# Patient Record
Sex: Male | Born: 1985
Health system: Southern US, Community
[De-identification: ages and names within clinical notes are randomized; demographics above are authoritative.]

## PROBLEM LIST (undated history)

## (undated) DIAGNOSIS — Z9289 Personal history of other medical treatment: Secondary | ICD-10-CM

## (undated) DIAGNOSIS — Z960 Presence of urogenital implants: Secondary | ICD-10-CM

## (undated) DIAGNOSIS — G43909 Migraine, unspecified, not intractable, without status migrainosus: Secondary | ICD-10-CM

## (undated) DIAGNOSIS — M869 Osteomyelitis, unspecified: Secondary | ICD-10-CM

## (undated) DIAGNOSIS — J939 Pneumothorax, unspecified: Secondary | ICD-10-CM

## (undated) DIAGNOSIS — I10 Essential (primary) hypertension: Secondary | ICD-10-CM

## (undated) DIAGNOSIS — R519 Headache, unspecified: Secondary | ICD-10-CM

## (undated) DIAGNOSIS — N319 Neuromuscular dysfunction of bladder, unspecified: Secondary | ICD-10-CM

## (undated) DIAGNOSIS — Z978 Presence of other specified devices: Secondary | ICD-10-CM

## (undated) DIAGNOSIS — G709 Myoneural disorder, unspecified: Secondary | ICD-10-CM

## (undated) DIAGNOSIS — S24102A Unspecified injury at T2-T6 level of thoracic spinal cord, initial encounter: Secondary | ICD-10-CM

## (undated) DIAGNOSIS — W3400XA Accidental discharge from unspecified firearms or gun, initial encounter: Secondary | ICD-10-CM

## (undated) DIAGNOSIS — G8220 Paraplegia, unspecified: Secondary | ICD-10-CM

## (undated) DIAGNOSIS — K592 Neurogenic bowel, not elsewhere classified: Secondary | ICD-10-CM

## (undated) DIAGNOSIS — G904 Autonomic dysreflexia: Secondary | ICD-10-CM

## (undated) DIAGNOSIS — R51 Headache: Secondary | ICD-10-CM

## (undated) HISTORY — DX: Migraine, unspecified, not intractable, without status migrainosus: G43.909

## (undated) HISTORY — DX: Osteomyelitis, unspecified: M86.9

## (undated) HISTORY — DX: Pneumothorax, unspecified: J93.9

## (undated) HISTORY — PX: INCISION AND DRAINAGE HIP: SHX1801

## (undated) HISTORY — PX: GIRDLESTONE ARTHROPLASTY: SHX5550

---
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
## 2005-02-01 ENCOUNTER — Emergency Department (HOSPITAL_COMMUNITY): Admission: EM | Admit: 2005-02-01 | Discharge: 2005-02-01 | Payer: Self-pay | Admitting: Family Medicine

## 2006-07-24 ENCOUNTER — Emergency Department (HOSPITAL_COMMUNITY): Admission: EM | Admit: 2006-07-24 | Discharge: 2006-07-24 | Payer: Self-pay | Admitting: Emergency Medicine

## 2006-11-27 DIAGNOSIS — K592 Neurogenic bowel, not elsewhere classified: Secondary | ICD-10-CM

## 2006-11-27 DIAGNOSIS — N319 Neuromuscular dysfunction of bladder, unspecified: Secondary | ICD-10-CM

## 2006-11-27 DIAGNOSIS — Z9289 Personal history of other medical treatment: Secondary | ICD-10-CM

## 2006-11-27 DIAGNOSIS — W3400XA Accidental discharge from unspecified firearms or gun, initial encounter: Secondary | ICD-10-CM

## 2006-11-27 HISTORY — DX: Neurogenic bowel, not elsewhere classified: K59.2

## 2006-11-27 HISTORY — PX: BRAIN SURGERY: SHX531

## 2006-11-27 HISTORY — PX: EYE SURGERY: SHX253

## 2006-11-27 HISTORY — PX: ORBITAL RECONSTRUCTION: SHX2115

## 2006-11-27 HISTORY — DX: Accidental discharge from unspecified firearms or gun, initial encounter: W34.00XA

## 2006-11-27 HISTORY — DX: Personal history of other medical treatment: Z92.89

## 2006-11-27 HISTORY — DX: Neuromuscular dysfunction of bladder, unspecified: N31.9

## 2006-12-11 ENCOUNTER — Emergency Department (HOSPITAL_COMMUNITY): Admission: EM | Admit: 2006-12-11 | Discharge: 2006-12-11 | Payer: Self-pay | Admitting: Emergency Medicine

## 2006-12-12 ENCOUNTER — Inpatient Hospital Stay (HOSPITAL_COMMUNITY): Admission: AC | Admit: 2006-12-12 | Discharge: 2007-01-04 | Payer: Self-pay

## 2006-12-12 ENCOUNTER — Ambulatory Visit: Payer: Self-pay | Admitting: Cardiology

## 2006-12-18 ENCOUNTER — Ambulatory Visit: Payer: Self-pay | Admitting: Physical Medicine & Rehabilitation

## 2006-12-21 ENCOUNTER — Ambulatory Visit: Payer: Self-pay | Admitting: Infectious Diseases

## 2006-12-28 ENCOUNTER — Ambulatory Visit: Payer: Self-pay | Admitting: Vascular Surgery

## 2007-01-04 ENCOUNTER — Inpatient Hospital Stay (HOSPITAL_COMMUNITY)
Admission: RE | Admit: 2007-01-04 | Discharge: 2007-01-29 | Payer: Self-pay | Admitting: Physical Medicine & Rehabilitation

## 2007-02-01 ENCOUNTER — Ambulatory Visit: Payer: Self-pay | Admitting: Internal Medicine

## 2007-02-10 ENCOUNTER — Emergency Department (HOSPITAL_COMMUNITY): Admission: EM | Admit: 2007-02-10 | Discharge: 2007-02-10 | Payer: Self-pay | Admitting: Emergency Medicine

## 2007-02-25 ENCOUNTER — Ambulatory Visit: Payer: Self-pay | Admitting: Internal Medicine

## 2007-03-06 ENCOUNTER — Encounter
Admission: RE | Admit: 2007-03-06 | Discharge: 2007-06-04 | Payer: Self-pay | Admitting: Physical Medicine & Rehabilitation

## 2007-03-14 ENCOUNTER — Encounter: Admission: RE | Admit: 2007-03-14 | Discharge: 2007-06-12 | Payer: Self-pay | Admitting: Internal Medicine

## 2007-04-05 ENCOUNTER — Ambulatory Visit (HOSPITAL_COMMUNITY): Admission: RE | Admit: 2007-04-05 | Discharge: 2007-04-05 | Payer: Self-pay | Admitting: Urology

## 2007-04-22 ENCOUNTER — Emergency Department (HOSPITAL_COMMUNITY): Admission: EM | Admit: 2007-04-22 | Discharge: 2007-04-22 | Payer: Self-pay | Admitting: Emergency Medicine

## 2007-04-24 ENCOUNTER — Ambulatory Visit: Payer: Self-pay | Admitting: Internal Medicine

## 2007-05-27 ENCOUNTER — Ambulatory Visit: Payer: Self-pay | Admitting: Physical Medicine & Rehabilitation

## 2007-06-13 ENCOUNTER — Encounter: Admission: RE | Admit: 2007-06-13 | Discharge: 2007-07-16 | Payer: Self-pay | Admitting: Internal Medicine

## 2007-06-26 ENCOUNTER — Ambulatory Visit: Payer: Self-pay | Admitting: Physical Medicine & Rehabilitation

## 2007-06-26 ENCOUNTER — Encounter
Admission: RE | Admit: 2007-06-26 | Discharge: 2007-09-24 | Payer: Self-pay | Admitting: Physical Medicine & Rehabilitation

## 2007-07-02 ENCOUNTER — Ambulatory Visit: Payer: Self-pay | Admitting: Internal Medicine

## 2007-07-16 ENCOUNTER — Encounter: Admission: RE | Admit: 2007-07-16 | Discharge: 2007-08-23 | Payer: Self-pay | Admitting: Internal Medicine

## 2007-07-26 ENCOUNTER — Ambulatory Visit: Payer: Self-pay | Admitting: Physical Medicine & Rehabilitation

## 2007-08-29 ENCOUNTER — Ambulatory Visit: Payer: Self-pay | Admitting: Physical Medicine & Rehabilitation

## 2007-09-17 ENCOUNTER — Encounter: Payer: Self-pay | Admitting: Internal Medicine

## 2007-09-17 ENCOUNTER — Encounter
Admission: RE | Admit: 2007-09-17 | Discharge: 2007-12-16 | Payer: Self-pay | Admitting: Physical Medicine & Rehabilitation

## 2007-09-26 ENCOUNTER — Ambulatory Visit: Payer: Self-pay | Admitting: Physical Medicine & Rehabilitation

## 2007-10-01 ENCOUNTER — Encounter: Payer: Self-pay | Admitting: Internal Medicine

## 2007-10-01 DIAGNOSIS — G8220 Paraplegia, unspecified: Secondary | ICD-10-CM | POA: Insufficient documentation

## 2007-10-01 DIAGNOSIS — K219 Gastro-esophageal reflux disease without esophagitis: Secondary | ICD-10-CM | POA: Insufficient documentation

## 2007-10-01 DIAGNOSIS — R519 Headache, unspecified: Secondary | ICD-10-CM | POA: Insufficient documentation

## 2007-10-01 DIAGNOSIS — R51 Headache: Secondary | ICD-10-CM | POA: Insufficient documentation

## 2007-10-16 ENCOUNTER — Encounter: Payer: Self-pay | Admitting: Internal Medicine

## 2007-11-19 ENCOUNTER — Ambulatory Visit: Payer: Self-pay | Admitting: Physical Medicine & Rehabilitation

## 2007-12-12 ENCOUNTER — Ambulatory Visit: Payer: Self-pay | Admitting: Physical Medicine & Rehabilitation

## 2007-12-12 ENCOUNTER — Encounter
Admission: RE | Admit: 2007-12-12 | Discharge: 2008-03-11 | Payer: Self-pay | Admitting: Physical Medicine & Rehabilitation

## 2007-12-26 ENCOUNTER — Ambulatory Visit: Payer: Self-pay | Admitting: Internal Medicine

## 2008-01-01 ENCOUNTER — Encounter: Payer: Self-pay | Admitting: Internal Medicine

## 2008-01-01 ENCOUNTER — Encounter: Admission: RE | Admit: 2008-01-01 | Discharge: 2008-03-05 | Payer: Self-pay | Admitting: Internal Medicine

## 2008-01-08 ENCOUNTER — Encounter: Payer: Self-pay | Admitting: Internal Medicine

## 2008-01-16 ENCOUNTER — Ambulatory Visit: Payer: Self-pay | Admitting: Physical Medicine & Rehabilitation

## 2008-02-10 ENCOUNTER — Encounter: Payer: Self-pay | Admitting: Internal Medicine

## 2008-02-18 ENCOUNTER — Ambulatory Visit: Payer: Self-pay | Admitting: Physical Medicine & Rehabilitation

## 2008-03-05 ENCOUNTER — Encounter: Payer: Self-pay | Admitting: Internal Medicine

## 2008-03-10 ENCOUNTER — Encounter: Payer: Self-pay | Admitting: Internal Medicine

## 2008-03-16 ENCOUNTER — Telehealth: Payer: Self-pay | Admitting: Internal Medicine

## 2008-03-18 ENCOUNTER — Ambulatory Visit: Payer: Self-pay | Admitting: Internal Medicine

## 2008-03-18 DIAGNOSIS — R002 Palpitations: Secondary | ICD-10-CM | POA: Insufficient documentation

## 2008-03-18 HISTORY — DX: Palpitations: R00.2

## 2008-03-19 DIAGNOSIS — N319 Neuromuscular dysfunction of bladder, unspecified: Secondary | ICD-10-CM

## 2008-03-19 DIAGNOSIS — I959 Hypotension, unspecified: Secondary | ICD-10-CM

## 2008-03-20 ENCOUNTER — Encounter: Payer: Self-pay | Admitting: Internal Medicine

## 2008-03-23 ENCOUNTER — Encounter
Admission: RE | Admit: 2008-03-23 | Discharge: 2008-06-21 | Payer: Self-pay | Admitting: Physical Medicine & Rehabilitation

## 2008-03-25 ENCOUNTER — Ambulatory Visit: Payer: Self-pay | Admitting: Physical Medicine & Rehabilitation

## 2008-04-02 ENCOUNTER — Ambulatory Visit: Payer: Self-pay

## 2008-04-08 ENCOUNTER — Encounter: Payer: Self-pay | Admitting: Internal Medicine
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
## 2008-05-11 ENCOUNTER — Telehealth: Payer: Self-pay | Admitting: Internal Medicine

## 2008-05-11 ENCOUNTER — Encounter: Payer: Self-pay | Admitting: Internal Medicine

## 2008-05-14 ENCOUNTER — Telehealth: Payer: Self-pay | Admitting: Internal Medicine

## 2008-05-14 ENCOUNTER — Encounter: Payer: Self-pay | Admitting: Internal Medicine

## 2008-05-20 ENCOUNTER — Ambulatory Visit: Payer: Self-pay | Admitting: Physical Medicine & Rehabilitation

## 2008-05-26 ENCOUNTER — Encounter: Payer: Self-pay | Admitting: Internal Medicine

## 2008-05-26 ENCOUNTER — Encounter: Admission: RE | Admit: 2008-05-26 | Discharge: 2008-07-21 | Payer: Self-pay | Admitting: Internal Medicine

## 2008-07-06 ENCOUNTER — Encounter: Payer: Self-pay | Admitting: Internal Medicine

## 2008-07-07 ENCOUNTER — Encounter: Payer: Self-pay | Admitting: Internal Medicine

## 2008-07-16 ENCOUNTER — Telehealth: Payer: Self-pay | Admitting: Internal Medicine

## 2008-07-16 ENCOUNTER — Encounter: Payer: Self-pay | Admitting: Internal Medicine

## 2008-08-11 ENCOUNTER — Encounter
Admission: RE | Admit: 2008-08-11 | Discharge: 2008-11-09 | Payer: Self-pay | Admitting: Physical Medicine & Rehabilitation

## 2008-08-12 ENCOUNTER — Ambulatory Visit: Payer: Self-pay | Admitting: Physical Medicine & Rehabilitation

## 2008-09-08 ENCOUNTER — Ambulatory Visit: Payer: Self-pay | Admitting: Physical Medicine & Rehabilitation

## 2008-09-17 ENCOUNTER — Telehealth: Payer: Self-pay | Admitting: Internal Medicine

## 2008-09-25 ENCOUNTER — Telehealth: Payer: Self-pay | Admitting: Internal Medicine

## 2008-09-28 ENCOUNTER — Encounter: Payer: Self-pay | Admitting: Internal Medicine

## 2008-09-29 ENCOUNTER — Encounter: Payer: Self-pay | Admitting: Internal Medicine

## 2008-10-06 ENCOUNTER — Ambulatory Visit: Payer: Self-pay | Admitting: Physical Medicine & Rehabilitation

## 2008-10-11 ENCOUNTER — Encounter: Payer: Self-pay | Admitting: Internal Medicine

## 2008-11-03 ENCOUNTER — Ambulatory Visit: Payer: Self-pay | Admitting: Physical Medicine & Rehabilitation

## 2008-12-10 ENCOUNTER — Encounter
Admission: RE | Admit: 2008-12-10 | Discharge: 2009-03-10 | Payer: Self-pay | Admitting: Physical Medicine & Rehabilitation

## 2008-12-11 ENCOUNTER — Ambulatory Visit: Payer: Self-pay | Admitting: Physical Medicine & Rehabilitation

## 2008-12-15 ENCOUNTER — Telehealth: Payer: Self-pay | Admitting: Internal Medicine

## 2008-12-25 ENCOUNTER — Telehealth: Payer: Self-pay | Admitting: Internal Medicine

## 2008-12-30 ENCOUNTER — Encounter: Payer: Self-pay | Admitting: Internal Medicine

## 2009-01-04 ENCOUNTER — Telehealth (INDEPENDENT_AMBULATORY_CARE_PROVIDER_SITE_OTHER): Payer: Self-pay | Admitting: *Deleted

## 2009-01-07 ENCOUNTER — Ambulatory Visit: Payer: Self-pay | Admitting: Internal Medicine

## 2009-01-12 ENCOUNTER — Ambulatory Visit: Payer: Self-pay | Admitting: Physical Medicine & Rehabilitation

## 2009-02-10 ENCOUNTER — Ambulatory Visit: Payer: Self-pay | Admitting: Physical Medicine & Rehabilitation

## 2009-02-25 ENCOUNTER — Ambulatory Visit: Payer: Self-pay | Admitting: Internal Medicine

## 2009-02-25 DIAGNOSIS — R609 Edema, unspecified: Secondary | ICD-10-CM | POA: Insufficient documentation

## 2009-02-25 LAB — CONVERTED CEMR LAB
BUN: 13 mg/dL (ref 6–23)
Basophils Absolute: 0 10*3/uL (ref 0.0–0.1)
CO2: 29 meq/L (ref 19–32)
Chloride: 102 meq/L (ref 96–112)
Creatinine, Ser: 0.7 mg/dL (ref 0.4–1.5)
Eosinophils Absolute: 0.1 10*3/uL (ref 0.0–0.7)
GFR calc non Af Amer: 178.76 mL/min (ref >60)
Glucose, Bld: 93 mg/dL (ref 70–99)
HDL: 27.9 mg/dL — ABNORMAL LOW (ref >39.00)
Hemoglobin: 14.8 g/dL (ref 13.0–17.0)
MCV: 82.8 fL (ref 78.0–100.0)
Monocytes Absolute: 0.6 10*3/uL (ref 0.1–1.0)
Sodium: 139 meq/L (ref 135–145)
Total CHOL/HDL Ratio: 10
Triglycerides: 223 mg/dL — ABNORMAL HIGH (ref 0.0–149.0)
WBC: 8.1 10*3/uL (ref 4.5–10.5)

## 2009-02-27 DIAGNOSIS — J309 Allergic rhinitis, unspecified: Secondary | ICD-10-CM

## 2009-03-07 ENCOUNTER — Encounter: Payer: Self-pay | Admitting: Internal Medicine

## 2009-03-10 ENCOUNTER — Encounter
Admission: RE | Admit: 2009-03-10 | Discharge: 2009-06-08 | Payer: Self-pay | Admitting: Physical Medicine & Rehabilitation

## 2009-03-12 ENCOUNTER — Ambulatory Visit: Payer: Self-pay | Admitting: Physical Medicine & Rehabilitation

## 2009-04-07 ENCOUNTER — Ambulatory Visit: Payer: Self-pay | Admitting: Physical Medicine & Rehabilitation

## 2009-04-30 ENCOUNTER — Ambulatory Visit: Payer: Self-pay | Admitting: Physical Medicine & Rehabilitation

## 2009-06-04 ENCOUNTER — Ambulatory Visit: Payer: Self-pay | Admitting: Physical Medicine & Rehabilitation

## 2009-06-23 ENCOUNTER — Encounter
Admission: RE | Admit: 2009-06-23 | Discharge: 2009-09-21 | Payer: Self-pay | Admitting: Physical Medicine & Rehabilitation

## 2009-06-29 ENCOUNTER — Telehealth: Payer: Self-pay | Admitting: Internal Medicine

## 2009-07-02 ENCOUNTER — Ambulatory Visit: Payer: Self-pay | Admitting: Physical Medicine & Rehabilitation

## 2009-07-24 ENCOUNTER — Emergency Department (HOSPITAL_COMMUNITY): Admission: EM | Admit: 2009-07-24 | Discharge: 2009-07-24 | Payer: Self-pay | Admitting: Emergency Medicine

## 2009-07-30 ENCOUNTER — Ambulatory Visit: Payer: Self-pay | Admitting: Physical Medicine & Rehabilitation

## 2009-08-04 ENCOUNTER — Ambulatory Visit: Payer: Self-pay | Admitting: Internal Medicine

## 2009-08-04 DIAGNOSIS — I10 Essential (primary) hypertension: Secondary | ICD-10-CM | POA: Insufficient documentation

## 2009-08-11 ENCOUNTER — Ambulatory Visit: Payer: Self-pay | Admitting: Internal Medicine

## 2009-08-11 DIAGNOSIS — N529 Male erectile dysfunction, unspecified: Secondary | ICD-10-CM

## 2009-08-12 ENCOUNTER — Ambulatory Visit: Payer: Self-pay | Admitting: Internal Medicine

## 2009-08-25 ENCOUNTER — Ambulatory Visit: Payer: Self-pay | Admitting: Physical Medicine & Rehabilitation

## 2009-09-01 ENCOUNTER — Telehealth: Payer: Self-pay | Admitting: Internal Medicine

## 2009-09-12 ENCOUNTER — Encounter: Payer: Self-pay | Admitting: Internal Medicine

## 2009-09-16 ENCOUNTER — Ambulatory Visit: Payer: Self-pay | Admitting: Internal Medicine

## 2009-09-21 ENCOUNTER — Encounter
Admission: RE | Admit: 2009-09-21 | Discharge: 2009-10-15 | Payer: Self-pay | Admitting: Physical Medicine & Rehabilitation

## 2009-09-22 ENCOUNTER — Ambulatory Visit: Payer: Self-pay | Admitting: Physical Medicine & Rehabilitation

## 2009-10-14 ENCOUNTER — Ambulatory Visit: Payer: Self-pay | Admitting: Internal Medicine

## 2009-10-14 DIAGNOSIS — G9050 Complex regional pain syndrome I, unspecified: Secondary | ICD-10-CM

## 2009-10-15 ENCOUNTER — Encounter
Admission: RE | Admit: 2009-10-15 | Discharge: 2009-11-17 | Payer: Self-pay | Admitting: Physical Medicine & Rehabilitation

## 2009-10-18 ENCOUNTER — Ambulatory Visit: Payer: Self-pay | Admitting: Physical Medicine & Rehabilitation

## 2009-10-18 ENCOUNTER — Ambulatory Visit: Payer: Self-pay | Admitting: Internal Medicine

## 2009-11-17 ENCOUNTER — Ambulatory Visit: Payer: Self-pay | Admitting: Physical Medicine & Rehabilitation

## 2009-11-17 ENCOUNTER — Ambulatory Visit: Payer: Self-pay | Admitting: Internal Medicine

## 2009-12-09 ENCOUNTER — Emergency Department (HOSPITAL_COMMUNITY): Admission: EM | Admit: 2009-12-09 | Discharge: 2009-12-10 | Payer: Self-pay | Admitting: Emergency Medicine

## 2009-12-21 ENCOUNTER — Encounter
Admission: RE | Admit: 2009-12-21 | Discharge: 2010-03-21 | Payer: Self-pay | Admitting: Physical Medicine & Rehabilitation

## 2009-12-22 ENCOUNTER — Ambulatory Visit: Payer: Self-pay | Admitting: Physical Medicine & Rehabilitation

## 2010-01-18 ENCOUNTER — Encounter: Payer: Self-pay | Admitting: Internal Medicine

## 2010-01-20 ENCOUNTER — Ambulatory Visit: Payer: Self-pay | Admitting: Physical Medicine & Rehabilitation

## 2010-01-21 ENCOUNTER — Ambulatory Visit: Payer: Self-pay | Admitting: Internal Medicine

## 2010-02-16 ENCOUNTER — Encounter: Payer: Self-pay | Admitting: Internal Medicine

## 2010-02-17 ENCOUNTER — Ambulatory Visit: Payer: Self-pay | Admitting: Physical Medicine & Rehabilitation

## 2010-02-24 ENCOUNTER — Ambulatory Visit: Payer: Self-pay | Admitting: Internal Medicine

## 2010-03-02 ENCOUNTER — Encounter: Payer: Self-pay | Admitting: Internal Medicine

## 2010-03-07 ENCOUNTER — Encounter: Payer: Self-pay | Admitting: Internal Medicine

## 2010-03-07 ENCOUNTER — Encounter: Admission: RE | Admit: 2010-03-07 | Discharge: 2010-04-14 | Payer: Self-pay | Admitting: Internal Medicine

## 2010-03-16 ENCOUNTER — Encounter: Payer: Self-pay | Admitting: Internal Medicine

## 2010-03-28 ENCOUNTER — Encounter
Admission: RE | Admit: 2010-03-28 | Discharge: 2010-06-26 | Payer: Self-pay | Admitting: Physical Medicine & Rehabilitation

## 2010-03-31 ENCOUNTER — Encounter: Payer: Self-pay | Admitting: Internal Medicine

## 2010-04-11 ENCOUNTER — Ambulatory Visit: Payer: Self-pay | Admitting: Internal Medicine

## 2010-04-13 ENCOUNTER — Ambulatory Visit: Payer: Self-pay | Admitting: Physical Medicine & Rehabilitation

## 2010-04-17 ENCOUNTER — Emergency Department (HOSPITAL_COMMUNITY): Admission: EM | Admit: 2010-04-17 | Discharge: 2010-04-17 | Payer: Self-pay | Admitting: Emergency Medicine

## 2010-04-27 ENCOUNTER — Telehealth: Payer: Self-pay | Admitting: Internal Medicine

## 2010-06-10 ENCOUNTER — Ambulatory Visit: Payer: Self-pay | Admitting: Internal Medicine

## 2010-12-27 NOTE — Letter (Signed)
Summary: Facial Plastic Surgery/[facility]  Facial Plastic Surgery/[facility]   Imported By: Sherian Rein 03/15/2010 09:31:42  _____________________________________________________________________  External Attachment:    Type:   Image     Comment:   External Document
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
## 2010-12-27 NOTE — Miscellaneous (Signed)
Summary: Summary/[place]  Summary/[place]   Imported By: Lester Gerster 05/11/2010 08:14:59  _____________________________________________________________________  External Attachment:    Type:   Image     Comment:   External Document

## 2010-12-27 NOTE — Miscellaneous (Signed)
Summary: Plan of Care & Treatment/[facility]  Plan of Care & Treatment/[facility]   Imported By: Sherian Rein 06/13/2010 13:21:51  _____________________________________________________________________  External Attachment:    Type:   Image     Comment:   External Document

## 2010-12-27 NOTE — Miscellaneous (Signed)
Summary: PT Eval/[facility]   PT Eval/[facility]   Imported By: Sherian Rein 03/21/2010 08:09:16  _____________________________________________________________________  External Attachment:    Type:   Image     Comment:   External Document

## 2010-12-27 NOTE — Letter (Signed)
Summary: [facility]  [facility]   Imported By: Sherian Rein 01/25/2010 09:39:55  _____________________________________________________________________  External Attachment:    Type:   Image     Comment:   External Document

## 2010-12-27 NOTE — Letter (Signed)
Summary: [facility]  [facility]   Imported By: Sherian Rein 02/26/2010 09:19:16  _____________________________________________________________________  External Attachment:    Type:   Image     Comment:   External Document

## 2010-12-27 NOTE — Letter (Signed)
Summary: [facility]   Imported By: Lester Methow 04/28/2010 10:52:08  _____________________________________________________________________  External Attachment:    Type:   Image     Comment:   External Document

## 2010-12-27 NOTE — Letter (Signed)
Summary: CMN for Wheelchair reparis/[facility]  CMN for Wheelchair reparis/[facility]   Imported By: Sherian Rein 04/01/2010 14:48:29  _____________________________________________________________________  External Attachment:    Type:   Image     Comment:   External Document

## 2010-12-27 NOTE — Miscellaneous (Signed)
Summary: Plan of Care & Treatment/[facility]  Plan [facility] Care & Treatment/[facility]   Imported By: Sherian Rein 04/14/2010 08:23:24  _____________________________________________________________________  External Attachment:    Type:   Image     Comment:   External Document

## 2010-12-27 NOTE — Progress Notes (Signed)
Summary: Allergy Medication  Phone Note Call from Patient Call back at Home Phone [phone]   Summary of Call: Patient is requesting to resume singulair daily for allergies. Says this has helped more than anything in the past. If singulair is not covered, he would like prescription for allegra. If both are not covered, pt would like PA on singulair.  Initial call taken by: Lamar Sprinkles, CMA,  April 27, 2010 4:38 PM  Follow-up for Phone Call        OK for singulair 10mg  daily, #30, refill prn Follow-up by: Jacques Navy MD,  April 28, 2010 9:00 AM    New/Updated Medications: SINGULAIR 10 MG TABS (MONTELUKAST SODIUM) 1 once daily Prescriptions: SINGULAIR 10 MG TABS (MONTELUKAST SODIUM) 1 once daily  #90 x 3   Entered by:   Lamar Sprinkles, CMA   Authorized by:   Jacques Navy MD   Signed by:   Lamar Sprinkles, CMA on 04/28/2010   Method used:   Electronically to        [facility]. [phone]* (retail)       [address]       [place], [place]  [address]       Ph: [phone]       Fax: [phone]   RxID:   [id number]

## 2011-02-12 LAB — DIFFERENTIAL
Basophils Absolute: 0.1 10*3/uL (ref 0.0–0.1)
Basophils Relative: 1 % (ref 0–1)
Eosinophils Absolute: 0.2 10*3/uL (ref 0.0–0.7)
Lymphocytes Relative: 33 % (ref 12–46)
Lymphs Abs: 3.8 10*3/uL (ref 0.7–4.0)
Monocytes Absolute: 1 10*3/uL (ref 0.1–1.0)
Monocytes Relative: 9 % (ref 3–12)
Neutrophils Relative %: 56 % (ref 43–77)

## 2011-02-12 LAB — URINALYSIS, ROUTINE W REFLEX MICROSCOPIC
Ketones, ur: NEGATIVE mg/dL
Nitrite: NEGATIVE
Protein, ur: NEGATIVE mg/dL
Specific Gravity, Urine: 1.028 (ref 1.005–1.030)
pH: 6.5 (ref 5.0–8.0)

## 2011-02-12 LAB — URINE MICROSCOPIC-ADD ON

## 2011-02-12 LAB — POCT I-STAT, CHEM 8
BUN: 16 mg/dL (ref 6–23)
Glucose, Bld: 129 mg/dL — ABNORMAL HIGH (ref 70–99)
Potassium: 4.1 mEq/L (ref 3.5–5.1)
Sodium: 144 mEq/L (ref 135–145)
TCO2: 29 mmol/L (ref 0–100)

## 2011-02-12 LAB — URINE CULTURE

## 2011-02-12 LAB — CBC
HCT: 47.7 % (ref 39.0–52.0)
Hemoglobin: 15.7 g/dL (ref 13.0–17.0)
MCHC: 33 g/dL (ref 30.0–36.0)
WBC: 11.4 10*3/uL — ABNORMAL HIGH (ref 4.0–10.5)

## 2011-02-13 LAB — URINE MICROSCOPIC-ADD ON

## 2011-02-13 LAB — DIFFERENTIAL
Eosinophils Absolute: 0.2 10*3/uL (ref 0.0–0.7)
Eosinophils Relative: 2 % (ref 0–5)
Lymphocytes Relative: 20 % (ref 12–46)
Neutrophils Relative %: 68 % (ref 43–77)

## 2011-02-13 LAB — CBC
HCT: 37.3 % — ABNORMAL LOW (ref 39.0–52.0)
MCV: 83.8 fL (ref 78.0–100.0)
Platelets: 387 10*3/uL (ref 150–400)
RDW: 14 % (ref 11.5–15.5)

## 2011-02-13 LAB — URINALYSIS, ROUTINE W REFLEX MICROSCOPIC
Bilirubin Urine: NEGATIVE
Glucose, UA: NEGATIVE mg/dL
Ketones, ur: NEGATIVE mg/dL
Nitrite: NEGATIVE
Urobilinogen, UA: 1 mg/dL (ref 0.0–1.0)
pH: 6 (ref 5.0–8.0)

## 2011-02-13 LAB — URINE CULTURE

## 2011-02-13 LAB — POCT I-STAT, CHEM 8
BUN: 15 mg/dL (ref 6–23)
Calcium, Ion: 1.16 mmol/L (ref 1.12–1.32)
Creatinine, Ser: 0.9 mg/dL (ref 0.4–1.5)
TCO2: 27 mmol/L (ref 0–100)

## 2011-03-04 LAB — URINALYSIS, ROUTINE W REFLEX MICROSCOPIC
Bilirubin Urine: NEGATIVE
Nitrite: NEGATIVE
Specific Gravity, Urine: 1.005 (ref 1.005–1.030)
pH: 6.5 (ref 5.0–8.0)

## 2011-03-04 LAB — DIFFERENTIAL
Eosinophils Relative: 0 % (ref 0–5)
Lymphocytes Relative: 11 % — ABNORMAL LOW (ref 12–46)
Lymphs Abs: 1.5 10*3/uL (ref 0.7–4.0)

## 2011-03-04 LAB — URINE MICROSCOPIC-ADD ON

## 2011-03-04 LAB — LIPASE, BLOOD: Lipase: 14 U/L (ref 11–59)

## 2011-03-04 LAB — COMPREHENSIVE METABOLIC PANEL
AST: 20 U/L (ref 0–37)
Albumin: 3.6 g/dL (ref 3.5–5.2)
CO2: 25 mEq/L (ref 19–32)
Calcium: 9.6 mg/dL (ref 8.4–10.5)
Creatinine, Ser: 0.85 mg/dL (ref 0.4–1.5)
GFR calc Af Amer: >60 mL/min (ref >60)
GFR calc non Af Amer: >60 mL/min (ref >60)

## 2011-03-04 LAB — CBC
MCHC: 33.4 g/dL (ref 30.0–36.0)
MCV: 84.2 fL (ref 78.0–100.0)
Platelets: 298 10*3/uL (ref 150–400)

## 2011-03-04 LAB — URINE CULTURE
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
## 2011-04-11 NOTE — Assessment & Plan Note (Signed)
Sean Horton is back regarding his brain injury and spinal cord injury.  We  started him on Lyrica and Tofranil last visit.  His back pain is much  better but he is having pain from the sides radiating into the hips.  His wound is healing on his buttock.  He complains of significant  migraines associated with kickoff from his bladder.  He went to the ER  apparently for one of them and got pain medication.  He has not  collected his urine for culture as we had ordered last time.  He has not  seen Urology in followup.  Patient reports constipation with the  Percocet.  He has occasional spasms, particularly when he transfers, but  none at rest.  The patient remains in outpatient physical therapy and  apparently has been fairly motivated.  Patient rates his pain at a 6-8  out of 10 today.  States that pain interferes with general activity,  relations with others, enjoyment of life on a moderate to severe level.   REVIEW OF SYSTEMS:  Patient reports depression, anxiety, loss of smell,  bladder control problems as mentioned above.  Full review is in the  written health and history section of the chart.   SOCIAL HISTORY:  Is without change.   PHYSICAL EXAMINATION:  Blood pressure is 88/62, pulse is 110,  respiratory 18, he is sating 99% room air.  Patient is appropriate,  alert and oriented x3.  Upper extremity muscle function is 5 out of 5,  he continues to have poor trunk control and no appreciable movement in  the legs.  Patient had a minimal tone at the legs today with 2+  reflexes, he was much improved overall today.  HEART:  Tachycardic.  CHEST:  Clear.  ABDOMEN:  Soft, nontender.   ASSESSMENT:  1. T3 complete spinal cord injury.  2. Neurogenic bowel.  3. Neurogenic bladder with kickoff.  4. Centralized spinal pain.  5. Lower extremity spasticity which has improved with Baclofen      titration.  6. Depression/anxiety.  7. Headaches which may be a manifestation of autonomic dysreflexia      rather than migraines per se.   PLAN:  1. Need to see culture of urine collected to assess this as a source      for kickoff and potential autonomic dysfunction.  2. Patient needs to stay on top of his bowel program as we talked      about previously.  3. Continue treating sacral wound as this appears to be healing up.  4. We will increase Lyrica to 150 mg t.i.d. for one week and up to 300      mg b.i.d.  5. Increase Tofranil to 150 mg nightly.  6. Will change Percocet to oxycodone 5 mg one q.6 hours p.r.n.      breakthrough pain.  7. Refilled Lidoderm patches.  8. Patient needs to arrange followup with Urology to assess      urodynamics.  9. Maintain Baclofen at 20 mg t.i.d.  10.I will see him back in about a month's time.  Actually, I would      like for him to continue working on therapy over this time as well.      Ranelle Oyster, M.D.  Electronically Signed     ZTS/MedQ  D:  07/01/2007 12:43:31  T:  07/01/2007 14:05:25  Job #:  [id number]   cc:   Rosalyn Gess. Norins, MD  [address] [facility]  [place]  [place] [address]
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
## 2011-04-11 NOTE — Assessment & Plan Note (Signed)
[facility]                                 ON-CALL NOTE   Sean Horton, Sean Horton                          MRN:          [id number]  DATE:06/01/2008                            DOB:          19-May-1986    DATE OF INTERACTION:  June 01, 2008 at 5:59 p.m.   PHONE NUMBER:  [phone]   CALLER:  Bard Herbert with [facility].   SUBJECTIVE:  The patient has irregular heartbeat.  He is a 25 year old  quadriplegic with fast irregular heartbeat who was followed by Dr.  Debby Bud.  He also has a pressure ulcer, which is why [facility]  is seeing him 3 times a week.  He has complained of palpitations before,  but the nurse has never found any irregular heartbeat before.  Pulse is  typically 90-100 with blood pressure 134/92.  He has had a monitor of a  month in the past with no findings found.  She feels that he is having  an irregularly irregular rhythm at this time, although his rate is less  than 100.   OBJECTIVE:  Possible atrial fibrillation.   PLAN:  If the patient is unstable, should go to the emergency room to be  seen.  It sounds like he is indeed stable at this point.  Would call if  his heartbeat stays irregular for an appointment tomorrow.  If his  heartbeat is irregular when she is ready to leave the house today, would  take him to [facility] and have him get an EKG there to at least document  the rhythm that he is in, whether it  be PVCs, AFib, flutter, etc.  It  does not sound like it would be flutter and with his rate control where  it is.  Call Dr. Debby Bud in the morning, or possibly go in to be seen.   Primary care Naliah Eddington is Dr. Debby Bud and home office is [place].     Arta Silence, MD  Electronically Signed    RNS/MedQ  DD: 06/01/2008  DT: 06/02/2008  Job #: [id number]
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
## 2011-04-11 NOTE — Assessment & Plan Note (Signed)
Sean Horton is back regarding his spinal cord injury and pain.  He is back on  his OxyContin and oxycodone as we previously had prescribed.  He still  complains of left hand burning pain and numbness.  He had not really  discussed further with the nursing or at least to have it got back to me  as a growing concern.  We had increased his Elavil slightly, though he  did not notice much difference with that.  The wound has generally  healed.  He is still applying a wet-to-dry dressing daily.  Pain is a  5/6 currently.  He described it as sharp and aching.  Pain interferes  with general activity, in relationship with others, and enjoyment of  life on a moderate level.  Pain worsens with bending and activities,  improves with his medications.   REVIEW OF SYSTEMS:  Notable for ongoing bladder spasms.  He has not  followed up with Urology recently.  His bowel schedule is fairly  regulated.  Other pertinent positives are above.  Full review is in the  written health and history section.   SOCIAL HISTORY:  The patient is single, living with his parents.   PHYSICAL EXAMINATION:  VITAL SIGNS:  Blood pressure is 137/55, pulse is  90, respiratory rate is 18, and he is sating 94% on room air.  EXTREMITIES:  The patient has trace tone in the legs with active  reflexes at 3+.  His sensation is still 2/3 about the level of injury.  He has fair truncal control.  He has positive Tinel sign at the left  elbow today and left wrist as well.  No ulnar weakness or sensory loss  or atrophy is seen.  Gaze remains disconjugate.  Cognitively, he is  intact.   ASSESSMENT:  1. T3 complete spinal cord injury.  2. Traumatic brain injury.  3. Neurogenic bowel and bladder.  4. Central spinal cord pain.  5. Lower extremity spasticity.  6. Depression/anxiety.  7. Sacral wound.  8. Headaches.  9. Left ulnar nerve compression at the elbow.   PLAN:  1. At this time, his ulnar nerve issues appear mild still.  I would   recommend elbow pads and elevation and pressure off the elbow with      straight arm sleeping at night.  2. Continue Elavil and Neurontin as ordered.  3. I refilled oxycodone 15 mg q.12 h. p.r.n., #30, and OxyContin 20 mg      q.8 h. scheduled, #90.  4. We will see him back in 1 month with nurse clinic and 2 months with      me and will assess his nerve symptoms at that point.  Consider      nerve conduction EMG.      Ranelle Oyster, M.D.  Electronically Signed     ZTS/MedQ  D:  09/08/2008 13:03:33  T:  09/09/2008 04:35:03  Job #:  [id number]   cc:   Rosalyn Gess. Norins, MD  [address]  [place]  [place] [address]

## 2011-04-11 NOTE — Assessment & Plan Note (Signed)
Sean Horton's back regarding  his brain injury and spinal cord injury.  He  stated the Tofranil increased his migraine headaches.  Headaches are now  gone.  He is coming off it.  He is back on Elavil.  He brought in a  sample of urine today for me to submit to the lab.  Oxycodone is not  helping his pain dramatically.  Still having a lot of hip and leg pain.  Complains of a lot of kick-off in his bladder.  Baclofen helps to a  certain extent with his spasms but he still has some with activity.  He  remains on Lyrica 300 mg b.i.d.  He uses his Lidoderm patches somewhat.  Therapy has been continuing off and on.  The patient tells me that he  catheterizes himself 3 times a day and volume is running from 200 mL to  1,000.  It does not always seem to relate to his fluid intake.  He is  sleeping about 6 hours a night.   REVIEW OF SYSTEMS:  Notable for the above.  Full review has been written  up in the history section.   SOCIAL HISTORY:  The patient is single.  His father is with him today.   PHYSICAL EXAMINATION:  VITAL SIGNS:  Blood pressure 101/58, pulse 102,  respiratory rate 18, he is satting 100% on room air.  GENERAL:  The patient is pleasant, alert and oriented x3.  Affect is  generally bright and appropriate.  He had no tone in his legs today.  Reflexes were 1+-2+.  Strength 5/5.  Sensory exam is unchanged.  HEART:  Slightly tachycardic.  CHEST:  Clear.  ABDOMEN:  Soft, nontender.  VISUAL:  Stable.   ASSESSMENT:  1. T3 complete spinal cord injury.  2. Neurogenic bowel.  3. Neurogenic bladder with kick-off and high cath volumes.  4. Centralized spinal pain.  5. Lower extremity spasticity, which has responded to Baclofen for the      most part.  6. Depression/anxiety.  7. History of sacral wounds.   PLAN:  1. Send urine to lab today for UA and C&S.  The patient needs to see      Dr. Patsi Sears for followup.  We discussed his catheterization      volumes and these need to be lower  as they are frequently running      about 500 or more by his account and I would shoot for 200-400cc      cath volumes.  He will need to go to a 6 hour schedule and somewhat      adjust it to his fluid intake as well.  2. Continue Lyrica 300 mg b.i.d.  3. Increase Elavil to 100 mg q. h.s.  This may help with any potential      bladder spasticity.  4. Continue Urelle per urology.  5. Refill Lidoderm patches.  6. Begin trial of OxyContin 20 mg q. 12 hours with oxycodone for      breakthrough pain.  He will need to watch is      bowel habits with this.  7. Maintain Baclofen 20 mg t.i.d. but add a daily p.r.n. dose of 10 mg      prior to activity, transfers, etc.      Ranelle Oyster, M.D.  Electronically Signed     ZTS/MedQ  D:  07/30/2007 12:45:31  T:  07/30/2007 13:47:25  Job #:  [id number]   cc:   Almedia Balls. Ranell Patrick, M.D.  Fax:  [phone]   Sigmund I. Patsi Sears, M.D.  Fax: [phone]

## 2011-04-11 NOTE — Assessment & Plan Note (Signed)
HISTORY:  Sean Horton is back regarding his spinal cord injury and brain  injury.  He is still having his periodic  problems with spasms,  particularly when he changes directions.  He is seeing physicians at  [facility] regarding his wound.  He still has a VAC in place and the  wound seems to be healing, although it sounds as if he is having some  hypergranulation there.  They have talked about a skip graft  potentially.  Pain is much improved with Lyrica.  He has not gotten  OxyContin filled due to not getting the name brand which has not helped  him as much.  Otherwise, mood has been stable.   REVIEW OF SYSTEMS:  Notable for the above.  He was recently sampled for  his urine and, apparently, may have had another UTI.   SOCIAL HISTORY:  The patient is single, living with his parents.  His  girlfriend is with him today.   PHYSICAL EXAMINATION:  VITAL SIGNS:  Blood pressure 133/59, pulse 69,  respiratory rate 18, satting 97% on room air.  GENERAL:  The patient is pleasant, alert and oriented x3.  Affect is  bright and appropriate.  The patient is in his wheelchair.  He had some  trace tone in the left hamstrings today.  No clonus was seen.  Reflexes  were 2+.  Trace resting tone was noted in the right leg, as well.  He  still is sensate in both legs.  Back is in place and the sacral area  seems to be functioning.  Wound is quite smaller than we last saw it.  He has a disconjugate gaze at this point, as well.  Cognitively, he  seemed to be generally appropriate with good insight and awareness.   ASSESSMENT:  1. T3 complete spinal cord injury.  2. Neurogenic bowel and bladder.  3. Centralized spinal cord pain.  4. Spasticity.  5. Depression/anxiety.  6. History of sacral wound.   PLAN:  1. The patient needs followup of his urine and his wound issues.      These certainly are instigators for his spasm that he experiences.  2. Continue Lyrica and Elavil, as well as Urelle per urology.  He  does      have a follow-up visit with Dr. Patsi Sears.  3. Continue OxyContin but use name brand 20 mg q.8h.  4. Oxycodone for break-through pain.  5. Will add Zanaflex capsules 2 mg q.8h. for spasticity.   PLAN:  I will see him back in about 2 months with nurse clinic followup  in 1 month's time.      Ranelle Oyster, M.D.  Electronically Signed     ZTS/MedQ  D:  11/25/2007 11:54:59  T:  11/25/2007 12:17:51  Job #:  [id number]

## 2011-04-11 NOTE — Assessment & Plan Note (Signed)
Sean Horton is back regarding his spinal cord and brain injury. He has surgery  apparently scheduled later this month for his left eye. His spasms have  been a bit better, although he still has them where he transfers. His  bladder has been doing better with the Elavil and Urelle treatments. He  did have a UTI which we treated with Levaquin. He brought another sample  of urine today. His OxyContin has been working better than his other  medications for pain. He does feel that he has gaps during the middle of  the day and at nighttime on the every 12 hour scheduling. The patient  rates his pain on average as a 4 to 5 out of 10. The pain is described  is tingling and sometimes stabbing. The pain interferes with general  activity, relations with other, and enjoyment of life on a moderate  level.   REVIEW OF SYSTEMS:  The patient reports spasm, depression, anxiety, loss  of smell. His girlfriend is concerned over his lack of water  consumption, although Sean Horton states that he likes to drink juice and  other beverages. He tells me that he does not want a lot of water.   SOCIAL HISTORY:  The patient is single living with his parents. He  denies any alcohol or drug use.   PHYSICAL EXAMINATION:  VITAL SIGNS:  Blood pressure 105/62, pulse 106,  respiratory rate 18. He is saturating 96% on room air.  GENERAL:  The patient is pleasant. Alert and oriented x3. Affect is  bright and appropriate. He is sitting in his wheelchair with a fair  posture. He has excellent upper extremity strength. No appreciable motor  is seen in the lower extremities. He has poor truncal control still. No  spasms are appreciated. Reflexes are trace to absent in both legs. He  had full passive movement in all tested joints in the lower extremities  today.   ASSESSMENT:  1. T3 complete spinal cord injury.  2. Neurogenic bowel and bladder.  3. Centralized spinal cord pain.  4. Spasticity.  5. Depression/anxiety.  6. History of  sacral wound.   PLAN:  1. Recollect UA C&S... also will check a CBC and CMET.  2. Continue Lyrica and Elavil at current dosing.  3. Continue Urelle per urology.  4. We will increase OxyContin to 20 mg every 8 hours for better      coverage of pain.  5. Oxycodone 5 mg for breakthrough pain.  6. Encouraged ongoing fluid consumption. It might be best to try to      stay away from frequent sugared drinks.  7. Use Baclofen for spasm.  8. I think it would benefit Sean Horton from beginning some weight program      for his upper extremities to help maintain his fitness and      cardiopulmonary conditioning as well.  9. Follow up with surgery his vision per [facility].  10.Urology follow up per Dr. Patsi Horton.  11.I will see Sean Horton back in about two months' time with nurse clinic      follow up in one month.      Sean Horton, M.D.  Electronically Signed     ZTS/MedQ  D:  08/30/2007 15:39:55  T:  08/31/2007 06:31:42  Job #:  [id number]   cc:   Sean Horton, M.D.  Fax: [phone]   Sean Horton, M.D.  Fax: [phone]

## 2011-04-11 NOTE — Assessment & Plan Note (Signed)
Sean Horton is back regarding his brain injury and spinal cord injury.  He is  still having some spasms but he it not getting his baclofen as  frequently as we hoped.  He took the Zanaflex for a bit and was not sure  how much these helped, although his Baclofen was not maxed out at that  point.  Overall leg pain is a bit better.  He is on 300 mg b.i.d. of  Lyrica, as well as Elavil.  He is on OxyContin 20mg  q.8 h. with  oxycodone for breakthrough.  Pain is a 7-8 out of 10.  He describes it  as burning and tingling.  The wound is healing on the sacrum, although  he still has the vacuum in place.  He is back in therapy once again.   REVIEW OF SYSTEMS:  Notable for the above.  Mood has been generally  stable.  He does complain of some erectile dysfunction and was being  shipped some Cialis, although he is unsure that he can cover the cost of  this.   SOCIAL HISTORY:  The patient is living with his parents.  His girlfriend  is with him today.   PHYSICAL EXAMINATION:  VITAL SIGNS:  Blood pressure is 133/61, pulse is  91, respiratory rate 18.  He is sating 96% on room air.  GENERAL:  The patient is pleasant, alert and oriented x3.  Affect is  bright and appropriate.  MUSCULOSKELETAL:  He is in his wheelchair.  The patient had trace tone  at both legs.  Reflexes are 2 plus plus.  Sensation is zero below the  waist essentially.  He may have had some sense to pressure along the  buttock area.  Vacuum is in place and functioning and size has decreased  substantially.  Truncal control is fair.  MENTAL STATUS:  Cognitively, he is intact.  His gaze remains  disconjugate.   ASSESSMENT:  1. T3 complete spinal cord injury.  2. Neurogenic bowel and bladder.  3. Centralized spinal cord pain.  4. Lower extremity spasticity.  5. Depression/anxiety.  6. Sacral wound.   PLAN:  1. Continue appropriate bladder and wound care.  These certainly could      be initiated for spasticity.  2. We will increase  baclofen dose to 20 mg q.8 h.  He was given 180      today.  3. Start Zanaflex capsule 10 mg q.8 h. in two weeks' time for      spasticity control.  He can stager these with the baclofen.  4. Continue Elavil and Lyrica.  5. I refilled the OxyContin 20 mg, #90; and oxycodone 5 mg, #60, for      breakthrough pain.  6. Continue therapy as he is doing.  7. Continue home stretching and exercise.  8. I will see him back in 2 months with nurse clinic followup in one      month's time.      Ranelle Oyster, M.D.  Electronically Signed     ZTS/MedQ  D:  01/17/2008 15:46:56  T:  01/18/2008 15:40:29  Job #:  [id number]

## 2011-04-11 NOTE — Assessment & Plan Note (Signed)
Sean Horton is back regarding his spinal cord injury and brain injury.  Sean Horton  still complains of some spasms in the legs but they seem to be overall  improved.  He still notes some 4 or 5 times a day but they only last for  seconds and usually are dependent upon spasms and other issues that may  bring them on including his wound, etc.  He saw Dr. Debby Bud recently who  had some concern over his hypotension with systolic blood pressure in  the 80s.  Sean Horton reports using OxyContin on a p.r.n. basis for when his  pain is present in the hip and legs.  It seems that the pain is more  inconsistent than it was previously and he admits to it being improved.  Pain will range from a 0 to 7/10.  He does report some spells where he  becomes diaphoretic and has had some elevations in his heart rate.  It  is hard for him to associate it with any particular other factors.  Wound is healing on the sacrum and he continues to have vacuum  treatments despite the wound being only 2 cm in depth and possibly 1 or  2 in width.   REVIEW OF SYSTEMS:  Patient reports tingling and other issues mentioned  above.  He has come off his Urelle.  He was placed on Cipro for a period  of time for a prospective UTI last week by Dr. Debby Bud.   SOCIAL HISTORY:  Essentially is unchanged.  He lives with his parents.  Family member is with him today.   PHYSICAL EXAM:  Blood pressure is 133/61.  Pulse is 101.  Respiratory  rate 18.  He is satting 99% in room air.  Patient is pleasant, alert and  oriented x3.  Affect is bright and generally appropriate.  He is in his  wheelchair.  He has trace tone in the left leg, no tone on the right.  Reflexes are 2+ to 3+.  Sensation below the level of injury is 0/2.  A  vacuum was in place in the gluteal region.  He had fair truncal control  today.  Cognitively, he was appropriate.  He has ongoing disconjugate  gaze.  He seems to have put on some weight.   ASSESSMENT:  1. T3 complete spinal cord  injury.  2. Neurogenic bowel and bladder.  3. Centralized spinal cord pain.  4. Lower extremity spasticity due to #1.  5. Depression/anxiety.  6. Sacral wound which is healing.  7. Intermittent sympathetic dysfunction.   PLAN:  1. I do not feel that the patient has outright autonomic dysreflexia      but certainly has sympathetic dysfunction related to the level of      his injury.  I am not concerned with his low/variable blood      pressure or periodic changes in his heart rate.  These are quite      common with the injury at his level.  We discussed the factors that      he needs to work on controlling including his urine and bladder      emptying.  We need to try to steer free from UTIs.  He needs to      keep up with his bowel program and watch closely for skin-related      issues.  We need to work on spasms as well.  He also would benefit      from avoidance of abrupt temperature changes.  2. We will keep baclofen at 20 mg every 8 hours for now.  If anything,      I would increase it rather than decrease it.  3. We will discontinue his Oxy CR and oxycodone at their current doses      and replace them with oxycodone 15 mg every 6 hours p.r.n.  I think      the fact that the pain is decreasing in intensity and regularity is      a good sign and I would hope to move away from the oxycodone over      the next several months.  4. Follow up with the wound care clinic regarding his sacral wound.  I      think he could come off the [facility] at this point.  5. Aggressive home stretching exercises recommended.  6. I gave him some information on spinal cord injury and autonomic      dysreflexia today so he knows what to look out for.  7. Continue Lyrica for now.  We will try to decrease his Elavil down      to 50 mg.  8. I will see him back in about 2 months' time.      Sean Horton, M.D.  Electronically Signed     ZTS/MedQ  D:  03/25/2008 13:38:09  T:  03/25/2008 14:09:20  Job  #:  [id number]   cc:   Sean Gess. Norins, MD  [address]  [place]  [place] [address]

## 2011-04-11 NOTE — Assessment & Plan Note (Signed)
Sean Horton is back regarding his multiple issues.  He has been out his  OxyContin and oxycodone for almost a month now.  The leg pain and back  pain does not appreciably change.  He is having some symptoms in his  left wrist with numbness over the fingers of the fourth and fifth  fingers.  He appreciates no specific weakness.  He has some pain  associated with numbness.  He is on Lyrica 300 mg b.i.d. currently.  He  had backed off his Elavil for plan at last visit.  He lost his  prescription apparently for this coming month.  Wound is slowly healing.  He feels that he has another 2-3 weeks of vacuum and that he will be off  it.  The Imitrex has worked for migraine headaches.  They have been a  bit better overall.  He has identified some sources that are related to  dysreflexia that could be causing headaches.   The patient rates this pain as a 6/10, described as sharp, constant,  aching.  Pain interferes the general activity, relations with others,  enjoyment of life on a moderate level.   REVIEW OF SYSTEMS:  Notable for occasional spasms, otherwise stable.  Full review is in the written health and history section in the chart.   SOCIAL HISTORY:  The patient is single.  Living with his parents  currently.   PHYSICAL EXAMINATION:  VITAL SIGNS:  Blood pressure 131/61, pulse 100,  respiratory is 20, he is sating 98% on room air.  GENERAL:  The patient's affect and demeanor appropriate.  He is in his  wheelchair today.  He has trace tone in the in the legs with reflexes  3+.  He has 0/2 sensation below the level of injury.  Vacuum still was  in place in the sacral region.  Truncal control is fair.  He had a  positive Tinel sign in the left wrist over the ulnar nerve.  He was  negative at the elbow.  No obvious ulnar nerve weakness was appreciated  on exam today.  Gait was disconjugate.  Weight appears to be somewhat  improved.   ASSESSMENT:  1. T3 complete spinal cord injury.  2. Traumatic  brain injury.  3. Neurogenic bowel and bladder.  4. Centralized spinal cord pain.  5. Lower extremity spasticity.  6. Depression/anxiety.  7. Sacral wound.  8. Intermittent sympathetic dysfunction and related headaches.   PLAN:  1. Since the patient has been off the OxyContin and oxycodone for 3-4      weeks and has not experienced appreciable change other than the      symptoms in the left hand, we will back down to Oxycodan 50 mg 1      q.12 h. p.r.n., #30 today.  2. I would like to increase his Elavil back to 100-150 mg to see if      this helps his left handed ulnar symptoms.  It is likely related to      his wheelchair propulsion and chronic positions he is assuming.  3. I would think he can come off his vacuum treatment at this point.      This seems there has been a rather extending course.  4. Continue Lyrica 300 mg b.i.d. as this a maximum dosing of this for      now.  5. Advised him to keep up with skin, bowel, bladder spasticity issues,      etc.  6. I will see him back  in about 2-3 months' time.      Ranelle Oyster, M.D.  Electronically Signed     ZTS/MedQ  D:  05/20/2008 13:34:01  T:  05/21/2008 32:44:01  Job #:  [id number]   cc:   Rosalyn Gess. Norins, MD  [address]  [place]  [place] [address]

## 2011-04-11 NOTE — Assessment & Plan Note (Signed)
Sean Horton is back regarding his spinal cord injury.  He is still having some  problems with his left fourth and fifth finger pain and tingling.  Apparently, he tried a condom catheter and leg bag, and the catheter was  too tight and he had some necrosis to the penis and now he is going to  have a skin graft due to the tissue damage.  His sacral wound is healing  out.  He has a suprapubic catheter in place for bladder drainage.  Spasticity seems to have increased a little bit.  The patient rates his  pain overall 6-8/10.  Describes it as a sharp, constant, and aching.  Pain interferes with general activity, in relationships with others, and  enjoyment of life on a moderate level.   REVIEW OF SYSTEMS:  Notable for numbness, spasms, and bladder control  problems.  Other pertinent positives are above and full review is in the  written health and history section of the chart.   SOCIAL HISTORY:  The patient is single.  He is with his mother today.   PHYSICAL EXAMINATION:  VITAL SIGNS:  Blood pressure is 148/57, pulse is  110, respiratory rate 18, and he is sating 97% on room air.  GENERAL:  The patient is pleasant, alert, and oriented x3.  EXTREMITIES:  He has intact strength in the upper extremities still.  He  has Tinel sign in the left elbow.  No atrophy of the hands, however, is  noted.  NEURO:  Cognitively, he is appropriate.  Cranial nerve exam is notable  for ongoing ptosis in the right eye.  There is decreased medial  deviation of the eye as well.  LOWER EXTREMITIES:  Without sense or volutional movement.  He did have  some reflex with movement today with range of motion.  Reflexes are 3+.  His baseline tone is 1-2/4.  GENITALIA:  I did not examine his penis or sacral area today.   ASSESSMENT:  1. T3 complete spinal cord injury.  2. Traumatic brain injury.  3. Neurogenic bowel and bladder.  4. Central spinal cord pain.  5. Left ulnar nerve compression at the elbow.  6. Penile  tissue damage.  7. Sacral wound.   PLAN:  1. We will have the patient set up for nerve conduction studies of the      left upper extremity to assess left ulnar nerve.  2. For symptoms of pain, we will continue Elavil and Lyrica.  Add      Keppra low dose 250 mg b.i.d. to supplement these.  3. Refill oxycodone 15 mg, #30, and OxyContin 20 mg q.8 h., #90.  4. Followup at [facility] for tissue graft.  5. I will see him back pending the above.      Ranelle Oyster, M.D.  Electronically Signed     ZTS/MedQ  D:  11/03/2008 13:10:08  T:  11/04/2008 02:54:45  Job #:  [id number]   cc:   Rosalyn Gess. Norins, MD  [address]  [place]  [place] [address]
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
## 2013-06-09 DIAGNOSIS — G950 Syringomyelia and syringobulbia: Secondary | ICD-10-CM | POA: Insufficient documentation

## 2013-09-11 DIAGNOSIS — Z720 Tobacco use: Secondary | ICD-10-CM | POA: Insufficient documentation

## 2014-01-30 DIAGNOSIS — M2450 Contracture, unspecified joint: Secondary | ICD-10-CM | POA: Insufficient documentation

## 2014-01-30 DIAGNOSIS — R252 Cramp and spasm: Secondary | ICD-10-CM | POA: Insufficient documentation

## 2014-01-30 DIAGNOSIS — Z993 Dependence on wheelchair: Secondary | ICD-10-CM | POA: Insufficient documentation

## 2014-01-30 DIAGNOSIS — M792 Neuralgia and neuritis, unspecified: Secondary | ICD-10-CM | POA: Insufficient documentation

## 2015-07-18 ENCOUNTER — Inpatient Hospital Stay (HOSPITAL_COMMUNITY): Payer: Medicaid Other

## 2015-07-18 ENCOUNTER — Emergency Department (HOSPITAL_COMMUNITY): Payer: Medicaid Other

## 2015-07-18 ENCOUNTER — Inpatient Hospital Stay (HOSPITAL_COMMUNITY)
Admission: EM | Admit: 2015-07-18 | Discharge: 2015-07-20 | DRG: 872 | Disposition: A | Discharge status: Home Health | Payer: Medicaid Other | Attending: Internal Medicine | Admitting: Internal Medicine

## 2015-07-18 ENCOUNTER — Encounter (HOSPITAL_COMMUNITY): Payer: Self-pay | Admitting: Emergency Medicine

## 2015-07-18 DIAGNOSIS — L98499 Non-pressure chronic ulcer of skin of other sites with unspecified severity: Secondary | ICD-10-CM

## 2015-07-18 DIAGNOSIS — E875 Hyperkalemia: Secondary | ICD-10-CM | POA: Diagnosis present

## 2015-07-18 DIAGNOSIS — N390 Urinary tract infection, site not specified: Secondary | ICD-10-CM

## 2015-07-18 DIAGNOSIS — Z7401 Bed confinement status: Secondary | ICD-10-CM

## 2015-07-18 DIAGNOSIS — L989 Disorder of the skin and subcutaneous tissue, unspecified: Secondary | ICD-10-CM

## 2015-07-18 DIAGNOSIS — G8929 Other chronic pain: Secondary | ICD-10-CM | POA: Diagnosis present

## 2015-07-18 DIAGNOSIS — L89229 Pressure ulcer of left hip, unspecified stage: Secondary | ICD-10-CM | POA: Diagnosis present

## 2015-07-18 DIAGNOSIS — A419 Sepsis, unspecified organism: Principal | ICD-10-CM | POA: Diagnosis present

## 2015-07-18 DIAGNOSIS — A084 Viral intestinal infection, unspecified: Secondary | ICD-10-CM | POA: Diagnosis present

## 2015-07-18 DIAGNOSIS — I872 Venous insufficiency (chronic) (peripheral): Secondary | ICD-10-CM | POA: Diagnosis present

## 2015-07-18 DIAGNOSIS — L98429 Non-pressure chronic ulcer of back with unspecified severity: Secondary | ICD-10-CM | POA: Diagnosis present

## 2015-07-18 DIAGNOSIS — G8220 Paraplegia, unspecified: Secondary | ICD-10-CM

## 2015-07-18 DIAGNOSIS — Z79899 Other long term (current) drug therapy: Secondary | ICD-10-CM | POA: Diagnosis not present

## 2015-07-18 DIAGNOSIS — D509 Iron deficiency anemia, unspecified: Secondary | ICD-10-CM | POA: Diagnosis present

## 2015-07-18 DIAGNOSIS — N319 Neuromuscular dysfunction of bladder, unspecified: Secondary | ICD-10-CM | POA: Diagnosis present

## 2015-07-18 DIAGNOSIS — N179 Acute kidney failure, unspecified: Secondary | ICD-10-CM | POA: Diagnosis present

## 2015-07-18 DIAGNOSIS — D638 Anemia in other chronic diseases classified elsewhere: Secondary | ICD-10-CM | POA: Diagnosis present

## 2015-07-18 DIAGNOSIS — K219 Gastro-esophageal reflux disease without esophagitis: Secondary | ICD-10-CM | POA: Diagnosis present

## 2015-07-18 DIAGNOSIS — F17290 Nicotine dependence, other tobacco product, uncomplicated: Secondary | ICD-10-CM | POA: Diagnosis present

## 2015-07-18 DIAGNOSIS — L89519 Pressure ulcer of right ankle, unspecified stage: Secondary | ICD-10-CM | POA: Diagnosis present

## 2015-07-18 DIAGNOSIS — R609 Edema, unspecified: Secondary | ICD-10-CM

## 2015-07-18 DIAGNOSIS — Z79891 Long term (current) use of opiate analgesic: Secondary | ICD-10-CM

## 2015-07-18 HISTORY — DX: Non-pressure chronic ulcer of skin of other sites with unspecified severity: L98.499

## 2015-07-18 HISTORY — DX: Accidental discharge from unspecified firearms or gun, initial encounter: W34.00XA

## 2015-07-18 HISTORY — DX: Paraplegia, unspecified: G82.20

## 2015-07-18 LAB — COMPREHENSIVE METABOLIC PANEL
ALT: 8 U/L — ABNORMAL LOW (ref 17–63)
AST: 40 U/L (ref 15–41)
Albumin: 2.9 g/dL — ABNORMAL LOW (ref 3.5–5.0)
Alkaline Phosphatase: 100 U/L (ref 38–126)
Anion gap: 6 (ref 5–15)
BUN: 35 mg/dL — ABNORMAL HIGH (ref 6–20)
CHLORIDE: 108 mmol/L (ref 101–111)
CO2: 24 mmol/L (ref 22–32)
Calcium: 8.7 mg/dL — ABNORMAL LOW (ref 8.9–10.3)
Creatinine, Ser: 1.24 mg/dL (ref 0.61–1.24)
Glucose, Bld: 194 mg/dL — ABNORMAL HIGH (ref 65–99)
POTASSIUM: 5.9 mmol/L — AB (ref 3.5–5.1)
SODIUM: 138 mmol/L (ref 135–145)
Total Bilirubin: 1.5 mg/dL — ABNORMAL HIGH (ref 0.3–1.2)
Total Protein: 7.1 g/dL (ref 6.5–8.1)

## 2015-07-18 LAB — CBC WITH DIFFERENTIAL/PLATELET
BASOS ABS: 0 10*3/uL (ref 0.0–0.1)
BASOS ABS: 0 10*3/uL (ref 0.0–0.1)
Basophils Relative: 0 % (ref 0–1)
Basophils Relative: 0 % (ref 0–1)
EOS ABS: 0.1 10*3/uL (ref 0.0–0.7)
EOS PCT: 1 % (ref 0–5)
Eosinophils Absolute: 0.2 10*3/uL (ref 0.0–0.7)
Eosinophils Relative: 2 % (ref 0–5)
HCT: 31.3 % — ABNORMAL LOW (ref 39.0–52.0)
HEMATOCRIT: 36.1 % — AB (ref 39.0–52.0)
Hemoglobin: 11.2 g/dL — ABNORMAL LOW (ref 13.0–17.0)
Hemoglobin: 9.7 g/dL — ABNORMAL LOW (ref 13.0–17.0)
LYMPHS ABS: 2 10*3/uL (ref 0.7–4.0)
LYMPHS PCT: 15 % (ref 12–46)
LYMPHS PCT: 13 % (ref 12–46)
Lymphs Abs: 1.3 10*3/uL (ref 0.7–4.0)
MCH: 23.5 pg — ABNORMAL LOW (ref 26.0–34.0)
MCH: 23.5 pg — AB (ref 26.0–34.0)
MCHC: 31 g/dL (ref 30.0–36.0)
MCHC: 31 g/dL (ref 30.0–36.0)
MCV: 75.7 fL — AB (ref 78.0–100.0)
MCV: 75.8 fL — AB (ref 78.0–100.0)
MONO ABS: 0.9 10*3/uL (ref 0.1–1.0)
MONO ABS: 0.7 10*3/uL (ref 0.1–1.0)
Monocytes Relative: 7 % (ref 3–12)
Monocytes Relative: 7 % (ref 3–12)
NEUTROS ABS: 9.7 10*3/uL — AB (ref 1.7–7.7)
Neutro Abs: 7.8 10*3/uL — ABNORMAL HIGH (ref 1.7–7.7)
Neutrophils Relative %: 76 % (ref 43–77)
Neutrophils Relative %: 78 % — ABNORMAL HIGH (ref 43–77)
PLATELETS: 208 10*3/uL (ref 150–400)
Platelets: 350 10*3/uL (ref 150–400)
RBC: 4.77 MIL/uL (ref 4.22–5.81)
RBC: 4.13 MIL/uL — ABNORMAL LOW (ref 4.22–5.81)
RDW: 16.8 % — AB (ref 11.5–15.5)
RDW: 17.1 % — AB (ref 11.5–15.5)
WBC: 12.8 10*3/uL — ABNORMAL HIGH (ref 4.0–10.5)
WBC: 9.9 10*3/uL (ref 4.0–10.5)

## 2015-07-18 LAB — BASIC METABOLIC PANEL
Anion gap: 6 (ref 5–15)
BUN: 39 mg/dL — AB (ref 6–20)
CALCIUM: 9.4 mg/dL (ref 8.9–10.3)
CO2: 25 mmol/L (ref 22–32)
CREATININE: 1.5 mg/dL — AB (ref 0.61–1.24)
Chloride: 108 mmol/L (ref 101–111)
GFR calc Af Amer: >60 mL/min (ref >60)
GLUCOSE: 139 mg/dL — AB (ref 65–99)
POTASSIUM: 4.3 mmol/L (ref 3.5–5.1)
Sodium: 139 mmol/L (ref 135–145)

## 2015-07-18 LAB — URINALYSIS, ROUTINE W REFLEX MICROSCOPIC
Bilirubin Urine: NEGATIVE
GLUCOSE, UA: NEGATIVE mg/dL
KETONES UR: NEGATIVE mg/dL
Nitrite: POSITIVE — AB
PH: 6.5 (ref 5.0–8.0)
Protein, ur: 100 mg/dL — AB
Specific Gravity, Urine: 1.021 (ref 1.005–1.030)
Urobilinogen, UA: 1 mg/dL (ref 0.0–1.0)

## 2015-07-18 LAB — I-STAT CHEM 8, ED
BUN: 43 mg/dL — ABNORMAL HIGH (ref 6–20)
CHLORIDE: 111 mmol/L (ref 101–111)
CREATININE: 1.3 mg/dL — AB (ref 0.61–1.24)
Calcium, Ion: 1.16 mmol/L (ref 1.12–1.23)
GLUCOSE: 140 mg/dL — AB (ref 65–99)
HCT: 39 % (ref 39.0–52.0)
Hemoglobin: 13.3 g/dL (ref 13.0–17.0)
POTASSIUM: 4.3 mmol/L (ref 3.5–5.1)
Sodium: 141 mmol/L (ref 135–145)
TCO2: 21 mmol/L (ref 0–100)

## 2015-07-18 LAB — PROTIME-INR
INR: 1.1 (ref 0.00–1.49)
PROTHROMBIN TIME: 14.4 s (ref 11.6–15.2)

## 2015-07-18 LAB — C-REACTIVE PROTEIN: CRP: 17.1 mg/dL — AB (ref <1.0)

## 2015-07-18 LAB — SEDIMENTATION RATE: SED RATE: 73 mm/h — AB (ref 0–16)

## 2015-07-18 LAB — LIPASE, BLOOD: LIPASE: 13 U/L — AB (ref 22–51)

## 2015-07-18 LAB — CBG MONITORING, ED: Glucose-Capillary: 146 mg/dL — ABNORMAL HIGH (ref 65–99)

## 2015-07-18 LAB — PREALBUMIN: PREALBUMIN: 12.5 mg/dL — AB (ref 18–38)

## 2015-07-18 LAB — URINE MICROSCOPIC-ADD ON

## 2015-07-18 LAB — APTT: aPTT: 29 seconds (ref 24–37)

## 2015-07-18 LAB — I-STAT CG4 LACTIC ACID, ED: LACTIC ACID, VENOUS: 1.72 mmol/L (ref 0.5–2.0)

## 2015-07-18 LAB — CK: CK TOTAL: 465 U/L — AB (ref 49–397)

## 2015-07-18 MED ORDER — SODIUM CHLORIDE 0.9 % IV BOLUS (SEPSIS)
500.0000 mL | Freq: Once | INTRAVENOUS | Status: AC
  Administered 2015-07-18: 500 mL via INTRAVENOUS

## 2015-07-18 MED ORDER — ADULT MULTIVITAMIN W/MINERALS CH
1.0000 | ORAL_TABLET | Freq: Every day | ORAL | Status: DC
  Administered 2015-07-18 – 2015-07-20 (×3): 1 via ORAL
  Filled 2015-07-18 (×3): qty 1

## 2015-07-18 MED ORDER — DULOXETINE HCL 60 MG PO CPEP
60.0000 mg | ORAL_CAPSULE | Freq: Two times a day (BID) | ORAL | Status: DC
  Administered 2015-07-18 – 2015-07-20 (×5): 60 mg via ORAL
  Filled 2015-07-18 (×6): qty 1

## 2015-07-18 MED ORDER — OXYCODONE-ACETAMINOPHEN 5-325 MG PO TABS: 1.0000 | ORAL_TABLET | Freq: Once | ORAL | Status: DC

## 2015-07-18 MED ORDER — ACETAMINOPHEN 325 MG PO TABS
650.0000 mg | ORAL_TABLET | Freq: Four times a day (QID) | ORAL | Status: DC | PRN
  Administered 2015-07-18: 650 mg via ORAL
  Filled 2015-07-18: qty 2

## 2015-07-18 MED ORDER — MORPHINE-NALTREXONE 50-2 MG PO CPCR: 1.0000 | ORAL_CAPSULE | Freq: Two times a day (BID) | ORAL | Status: DC

## 2015-07-18 MED ORDER — VANCOMYCIN HCL IN DEXTROSE 1-5 GM/200ML-% IV SOLN: 1000.0000 mg | Freq: Once | INTRAVENOUS | Status: DC

## 2015-07-18 MED ORDER — ENSURE ENLIVE PO LIQD
237.0000 mL | Freq: Two times a day (BID) | ORAL | Status: DC
  Administered 2015-07-19: 237 mL via ORAL

## 2015-07-18 MED ORDER — PREGABALIN 75 MG PO CAPS
300.0000 mg | ORAL_CAPSULE | Freq: Two times a day (BID) | ORAL | Status: DC
  Administered 2015-07-18 – 2015-07-20 (×5): 300 mg via ORAL
  Filled 2015-07-18 (×5): qty 4

## 2015-07-18 MED ORDER — FOLIC ACID 1 MG PO TABS
1.0000 mg | ORAL_TABLET | Freq: Every day | ORAL | Status: DC
  Administered 2015-07-18 – 2015-07-20 (×3): 1 mg via ORAL
  Filled 2015-07-18 (×3): qty 1

## 2015-07-18 MED ORDER — HYDROMORPHONE HCL 1 MG/ML IJ SOLN
1.0000 mg | INTRAMUSCULAR | Status: DC | PRN
  Administered 2015-07-18 – 2015-07-20 (×5): 1 mg via INTRAVENOUS
  Filled 2015-07-18 (×5): qty 1

## 2015-07-18 MED ORDER — IBUPROFEN 200 MG PO TABS
400.0000 mg | ORAL_TABLET | Freq: Once | ORAL | Status: AC
  Administered 2015-07-19: 400 mg via ORAL
  Filled 2015-07-18: qty 2

## 2015-07-18 MED ORDER — SODIUM CHLORIDE 0.9 % IV SOLN
Freq: Once | INTRAVENOUS | Status: AC
  Administered 2015-07-18: 02:00:00 via INTRAVENOUS

## 2015-07-18 MED ORDER — VANCOMYCIN HCL IN DEXTROSE 1-5 GM/200ML-% IV SOLN
1000.0000 mg | Freq: Three times a day (TID) | INTRAVENOUS | Status: DC
  Administered 2015-07-18 – 2015-07-20 (×6): 1000 mg via INTRAVENOUS
  Filled 2015-07-18 (×7): qty 200

## 2015-07-18 MED ORDER — ACETAMINOPHEN 650 MG RE SUPP: 650.0000 mg | Freq: Four times a day (QID) | RECTAL | Status: DC | PRN

## 2015-07-18 MED ORDER — PANTOPRAZOLE SODIUM 40 MG PO TBEC
40.0000 mg | DELAYED_RELEASE_TABLET | Freq: Every day | ORAL | Status: DC
  Administered 2015-07-18 – 2015-07-20 (×3): 40 mg via ORAL
  Filled 2015-07-18 (×3): qty 1

## 2015-07-18 MED ORDER — SODIUM CHLORIDE 0.9 % IJ SOLN
3.0000 mL | Freq: Two times a day (BID) | INTRAMUSCULAR | Status: DC
  Administered 2015-07-18 – 2015-07-20 (×4): 3 mL via INTRAVENOUS

## 2015-07-18 MED ORDER — DEXTROSE 5 % IV SOLN
1.0000 g | Freq: Once | INTRAVENOUS | Status: AC
  Administered 2015-07-18: 1 g via INTRAVENOUS
  Filled 2015-07-18: qty 10

## 2015-07-18 MED ORDER — OXYCODONE HCL 5 MG PO TABS
5.0000 mg | ORAL_TABLET | ORAL | Status: DC | PRN
  Administered 2015-07-18 – 2015-07-20 (×2): 5 mg via ORAL
  Filled 2015-07-18 (×2): qty 1

## 2015-07-18 MED ORDER — HEPARIN SODIUM (PORCINE) 5000 UNIT/ML IJ SOLN
5000.0000 [IU] | Freq: Three times a day (TID) | INTRAMUSCULAR | Status: DC
  Administered 2015-07-18 – 2015-07-20 (×6): 5000 [IU] via SUBCUTANEOUS
  Filled 2015-07-18 (×10): qty 1

## 2015-07-18 MED ORDER — SODIUM CHLORIDE 0.9 % IV SOLN
INTRAVENOUS | Status: DC
  Administered 2015-07-18 – 2015-07-19 (×3): via INTRAVENOUS

## 2015-07-18 MED ORDER — OXYCODONE-ACETAMINOPHEN 5-325 MG PO TABS
1.0000 | ORAL_TABLET | ORAL | Status: DC | PRN
  Administered 2015-07-18 – 2015-07-20 (×2): 1 via ORAL
  Filled 2015-07-18 (×2): qty 1

## 2015-07-18 MED ORDER — COLLAGENASE 250 UNIT/GM EX OINT
TOPICAL_OINTMENT | Freq: Every day | CUTANEOUS | Status: DC
  Administered 2015-07-19 – 2015-07-20 (×2): via TOPICAL
  Filled 2015-07-18: qty 30

## 2015-07-18 MED ORDER — MORPHINE SULFATE ER 30 MG PO TBCR
45.0000 mg | EXTENDED_RELEASE_TABLET | Freq: Two times a day (BID) | ORAL | Status: DC
  Administered 2015-07-18 – 2015-07-20 (×5): 45 mg via ORAL
  Filled 2015-07-18 (×10): qty 1

## 2015-07-18 MED ORDER — ONDANSETRON HCL 4 MG/2ML IJ SOLN: 4.0000 mg | Freq: Four times a day (QID) | INTRAMUSCULAR | Status: DC | PRN

## 2015-07-18 MED ORDER — VITAMIN B-1 100 MG PO TABS
100.0000 mg | ORAL_TABLET | Freq: Every day | ORAL | Status: DC
  Administered 2015-07-18 – 2015-07-20 (×3): 100 mg via ORAL
  Filled 2015-07-18 (×3): qty 1

## 2015-07-18 MED ORDER — SODIUM CHLORIDE 0.9 % IV SOLN
Freq: Once | INTRAVENOUS | Status: AC
  Administered 2015-07-18: 06:00:00 via INTRAVENOUS

## 2015-07-18 MED ORDER — VANCOMYCIN HCL IN DEXTROSE 1-5 GM/200ML-% IV SOLN
1000.0000 mg | Freq: Once | INTRAVENOUS | Status: AC
  Administered 2015-07-18: 1000 mg via INTRAVENOUS
  Filled 2015-07-18: qty 200

## 2015-07-18 MED ORDER — OXYCODONE-ACETAMINOPHEN 10-325 MG PO TABS: 1.0000 | ORAL_TABLET | ORAL | Status: DC | PRN

## 2015-07-18 MED ORDER — ONDANSETRON HCL 4 MG PO TABS: 4.0000 mg | ORAL_TABLET | Freq: Four times a day (QID) | ORAL | Status: DC | PRN

## 2015-07-18 MED ORDER — DEXTROSE 5 % IV SOLN
2.0000 g | Freq: Three times a day (TID) | INTRAVENOUS | Status: DC
  Administered 2015-07-18 – 2015-07-20 (×7): 2 g via INTRAVENOUS
  Filled 2015-07-18 (×11): qty 2

## 2015-07-18 MED ORDER — BACLOFEN 20 MG PO TABS
40.0000 mg | ORAL_TABLET | Freq: Two times a day (BID) | ORAL | Status: DC
  Administered 2015-07-18 – 2015-07-20 (×5): 40 mg via ORAL
  Filled 2015-07-18 (×6): qty 2

## 2015-07-18 NOTE — ED Notes (Signed)
Nurse getting labs 
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
## 2015-07-18 NOTE — Consult Note (Signed)
WOC wound consult note Reason for Consult: Chronic, non-healing wounds  Wound type:Pressure, venous insufficiency Pressure Ulcer POA: Yes Measurement:left hip (site of previous skin graft with 90% "take" and 10% resulting wound):  4cm x 2cm area at the periphery of the previously grafted site.  Pink base, non-granulating. Yellow exudate.  No odor.  Left lateral malleolus:  1.5cm x 2.2cm wound with wound bed obscured by the presence of a thin black eschar.  No drainage.  No odor.  Right lateral malleolus:  5cm x 4cm x 0.5cm wound with pale pink base, moderate amount of light yellow exudate with faint musty odor. Macerated wound edges.  Two toes on right foot are darlky discolored. Wound bed:As described above. Drainage (amount, consistency, odor) As described above. Periwound:AS described above.  Intact, clear, dry. Dressing procedure/placement/frequency: I will today provide a therapeutic mattress with low air loss feature (he has a therapeutic mattress at home) and bilateral pressure redistribution boots (he had these at one time, but doesn't know where his are at this time).  I will provide wound care orders for the Nursing staff (collagenase to the left lateral malleolus, a silver hydrofiber (Aquacel Ag+)  for the right lateral malleolus and the left hip periphery (where the skin graft did not "take")) . A preventive sacral dressing will be placed over the currently intact skin in that area. Patient is in agreement with the POC and bedside RN is in the room at the time of my assessment conducting her admission assessment. Thank you for this consultation.  The [facility] nursing team will not follow, but will remain available to this patient, the nursing and medical teams.  Please re-consult if needed. Thanks, Maudie Flakes, MSN, RN, [place], [place], [place] [phone])
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
## 2015-07-18 NOTE — Progress Notes (Signed)
Pt admitted after midnight, please see Dr. Posey Pronto admission H&P. Pt seen and examined at bedside, hemodynamically stable. Treating for UTI. Will repeat BMP and CBC in AM.  Faye Ramsay, MD  [facility] Pager [phone]  If 7PM-7AM, please contact night-coverage [email or web address] Password [email or web address]

## 2015-07-18 NOTE — ED Notes (Addendum)
Pt from home c/o of feeling as if he has an UTI. He has indwelling foley catheter that he reports was changed 1 week ago. Pt is wheelchair bound. He reports feeling as if he has no energy. Pt has open sores across arms,Chest, and back. Left hip ulcer noted. Pt reports that ulcer has been there since 2011.

## 2015-07-18 NOTE — Progress Notes (Signed)
VASCULAR LAB PRELIMINARY  PRELIMINARY  PRELIMINARY  PRELIMINARY  Bilateral lower extremity venous duplex completed.    Preliminary report:  There is no obvious evidence of acute DVT or SVT noted in the visualized veins of the bilateral lower extremities.   There is chronic DVT noted in the right popliteal vein.  Almer Littleton, RVT 07/18/2015, 1:10 PM

## 2015-07-19 LAB — BASIC METABOLIC PANEL
ANION GAP: 6 (ref 5–15)
BUN: 29 mg/dL — ABNORMAL HIGH (ref 6–20)
CALCIUM: 8.3 mg/dL — AB (ref 8.9–10.3)
CO2: 22 mmol/L (ref 22–32)
Chloride: 113 mmol/L — ABNORMAL HIGH (ref 101–111)
Creatinine, Ser: 1.17 mg/dL (ref 0.61–1.24)
GLUCOSE: 113 mg/dL — AB (ref 65–99)
Potassium: 3.9 mmol/L (ref 3.5–5.1)
Sodium: 141 mmol/L (ref 135–145)

## 2015-07-19 LAB — CBC
HCT: 27.6 % — ABNORMAL LOW (ref 39.0–52.0)
Hemoglobin: 8.4 g/dL — ABNORMAL LOW (ref 13.0–17.0)
MCH: 23.3 pg — ABNORMAL LOW (ref 26.0–34.0)
MCHC: 30.4 g/dL (ref 30.0–36.0)
MCV: 76.7 fL — ABNORMAL LOW (ref 78.0–100.0)
PLATELETS: 287 10*3/uL (ref 150–400)
RBC: 3.6 MIL/uL — ABNORMAL LOW (ref 4.22–5.81)
RDW: 17.2 % — AB (ref 11.5–15.5)
WBC: 8.8 10*3/uL (ref 4.0–10.5)

## 2015-07-19 MED ORDER — SODIUM CHLORIDE 0.9 % IV SOLN: INTRAVENOUS | Status: AC

## 2015-07-19 MED ORDER — SENNA 8.6 MG PO TABS
1.0000 | ORAL_TABLET | Freq: Every day | ORAL | Status: DC
  Administered 2015-07-19 – 2015-07-20 (×2): 8.6 mg via ORAL
  Filled 2015-07-19 (×2): qty 1

## 2015-07-19 MED ORDER — ENSURE ENLIVE PO LIQD
237.0000 mL | Freq: Three times a day (TID) | ORAL | Status: DC
  Administered 2015-07-19 – 2015-07-20 (×2): 237 mL via ORAL

## 2015-07-19 MED ORDER — SODIUM CHLORIDE 0.9 % IV BOLUS (SEPSIS)
500.0000 mL | Freq: Once | INTRAVENOUS | Status: AC
  Administered 2015-07-19: 500 mL via INTRAVENOUS
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
## 2015-07-20 LAB — BASIC METABOLIC PANEL
ANION GAP: 9 (ref 5–15)
BUN: 20 mg/dL (ref 6–20)
CALCIUM: 8.7 mg/dL — AB (ref 8.9–10.3)
CO2: 21 mmol/L — ABNORMAL LOW (ref 22–32)
CREATININE: 1 mg/dL (ref 0.61–1.24)
Chloride: 110 mmol/L (ref 101–111)
Glucose, Bld: 116 mg/dL — ABNORMAL HIGH (ref 65–99)
Potassium: 4.1 mmol/L (ref 3.5–5.1)
SODIUM: 140 mmol/L (ref 135–145)

## 2015-07-20 LAB — CBC
HEMATOCRIT: 29.2 % — AB (ref 39.0–52.0)
Hemoglobin: 8.4 g/dL — ABNORMAL LOW (ref 13.0–17.0)
MCH: 21.9 pg — ABNORMAL LOW (ref 26.0–34.0)
MCHC: 28.8 g/dL — AB (ref 30.0–36.0)
MCV: 76.2 fL — ABNORMAL LOW (ref 78.0–100.0)
Platelets: 273 10*3/uL (ref 150–400)
RBC: 3.83 MIL/uL — ABNORMAL LOW (ref 4.22–5.81)
RDW: 17.2 % — AB (ref 11.5–15.5)
WBC: 9 10*3/uL (ref 4.0–10.5)

## 2015-07-20 LAB — VANCOMYCIN, TROUGH: Vancomycin Tr: 30 ug/mL (ref 10.0–20.0)

## 2015-07-20 MED ORDER — BACITRACIN-NEOMYCIN-POLYMYXIN 400-5-5000 EX OINT: TOPICAL_OINTMENT | Freq: Three times a day (TID) | CUTANEOUS | Status: DC

## 2015-07-20 MED ORDER — VANCOMYCIN HCL IN DEXTROSE 750-5 MG/150ML-% IV SOLN
750.0000 mg | Freq: Two times a day (BID) | INTRAVENOUS | Status: DC
  Filled 2015-07-20: qty 150

## 2015-07-20 MED ORDER — CIPROFLOXACIN HCL 500 MG PO TABS: 500.0000 mg | ORAL_TABLET | Freq: Two times a day (BID) | ORAL | Status: DC

## 2015-07-20 NOTE — Care Management Note (Signed)
Case Management Note  Patient Details  Name: Sean Horton MRN: [id number] Date of Birth: 1986/10/20  Subjective/Objective:    Admitted with UTI                Action/Plan: Discharge planning, spoke with patient at bedside. Plans to d/c home with mother, has all DME. Will need HH RN for wound care, chose [facility]. Notified [facility] of d/c and orders. Patient also has Medicaid but not showing as having any insurance. Made copy of card to provide to admissions.   Expected Discharge Date:  07/18/15               Expected Discharge Plan:  [place]  In-House Referral:  NA  Discharge planning Services  CM Consult  Post Acute Care Choice:  Home Health Choice offered to:  Patient  DME Arranged:    DME Agency:     HH Arranged:  RN, Disease Management [place] Agency:  [facility]  Status of Service:  Completed, signed off  Medicare Important Message Given:    Date Medicare IM Given:    Medicare IM give by:    Date Additional Medicare IM Given:    Additional Medicare Important Message give by:     If discussed at [place] of Stay Meetings, dates discussed:    Additional Comments:  Guadalupe Maple, RN 07/20/2015, 10:30 AM

## 2015-07-20 NOTE — Plan of Care (Signed)
Problem: Phase I Progression Outcomes Goal: Voiding-avoid urinary catheter unless indicated Outcome: Not Applicable Date Met:  32/54/98 Chronic foley catheter

## 2015-07-20 NOTE — Discharge Instructions (Signed)

## 2015-07-20 NOTE — Clinical Documentation Improvement (Signed)
Hospitalist  Can the diagnosis of systemic infection/sepsis be further specified?   Sepsis with UTI  Sepsis with UTI secondary to indwelling foley  Other  Clinically Undetermined  Document any associated diagnoses/conditions.  Supporting Information:  Patient with paraplegia with chronic indwelling foley  Please exercise your independent, professional judgment when responding. A specific answer is not anticipated or expected.  Thank You,  Royal Broome [phone]

## 2015-07-20 NOTE — Progress Notes (Addendum)
ANTIBIOTIC CONSULT NOTE - FOLLOW UP  Pharmacy Consult for Vancomycin Indication: sepsis   No Known Allergies  Patient Measurements: Height: 6\' 5"  (195.6 cm) Weight: 248 lb 7.3 oz (112.7 kg) IBW/kg (Calculated) : 89.1   Vital Signs: Temp: 99.6 F (37.6 C) (08/23 0607) Temp Source: Oral (08/23 0607) BP: 127/73 mmHg (08/23 0607) Pulse Rate: 83 (08/23 0607) Intake/Output from previous day: 08/22 0701 - 08/23 0700 In: 2050 [P.O.:300; IV Piggyback:450] Out: 500 [Urine:500] Intake/Output from this shift: Total I/O In: 750 [P.O.:300; IV Piggyback:450] Out: 500 [Urine:500]  Labs:  Recent Labs  07/18/15 1241 07/19/15 0525 07/20/15 0525  WBC 9.9 8.8 9.0  HGB 9.7* 8.4* 8.4*  PLT 208 287 273  CREATININE 1.24 1.17 1.00   Estimated Creatinine Clearance: 150.5 mL/min (by C-G formula based on Cr of 1).  Recent Labs  07/20/15 0525  VANCOTROUGH 30*     Microbiology: Recent Results (from the past 720 hour(s))  Urine culture     Status: None (Preliminary result)   Collection Time: 07/18/15  3:57 AM  Result Value Ref Range Status   Specimen Description URINE, CATHETERIZED  Final   Special Requests Normal  Final   Culture   Final    >=100,000 COLONIES/mL PROTEUS MIRABILIS Performed at [facility]    Report Status PENDING  Incomplete  Culture, blood (x 2)     Status: None (Preliminary result)   Collection Time: 07/18/15 12:41 PM  Result Value Ref Range Status   Specimen Description BLOOD RIGHT ARM  Final   Special Requests   Final    BOTTLES DRAWN AEROBIC AND ANAEROBIC 10CC BOTH BOTTLES   Culture   Final    NO GROWTH < 24 HOURS Performed at [facility]    Report Status PENDING  Incomplete    Anti-infectives    Start     Dose/Rate Route Frequency Ordered Stop   07/18/15 1400  vancomycin (VANCOCIN) IVPB 1000 mg/200 mL premix     1,000 mg 200 mL/hr over 60 Minutes Intravenous Every 8 hours 07/18/15 0654     07/18/15 0700  cefTAZidime (FORTAZ) 2 g in  dextrose 5 % 50 mL IVPB     2 g 100 mL/hr over 30 Minutes Intravenous 3 times per day 07/18/15 0654     07/18/15 0630  vancomycin (VANCOCIN) IVPB 1000 mg/200 mL premix  Status:  Discontinued     1,000 mg 200 mL/hr over 60 Minutes Intravenous  Once 07/18/15 0622 07/18/15 0649   07/18/15 0530  vancomycin (VANCOCIN) IVPB 1000 mg/200 mL premix     1,000 mg 200 mL/hr over 60 Minutes Intravenous  Once 07/18/15 0528 07/18/15 0634   07/18/15 0400  cefTRIAXone (ROCEPHIN) 1 g in dextrose 5 % 50 mL IVPB     1 g 100 mL/hr over 30 Minutes Intravenous  Once 07/18/15 0357 07/18/15 0514      Assessment: 29yoM who is on Vancomcyin 1 Gm IV q8h for sepsis secondary to UTI. Blood Cx x2 =NGTD, Urine growing Proteus Mirabilis. Vanco trough level=30 mcg/ml . Pt afebrile, WBC=9 and Scr=1 (CrCL=>100). Pt is on concurrent Ceftazidime 2 Gm IV Q8h. Vanco 1gm was charted as given @0616  but only infusing x 10-26mins per RN   Goal of Therapy:  Vancomycin trough level 15-20 mcg/ml  Eradication of infection  Plan:  Will change Vancomycin regimen to 750mg  IV Q12h  Will recheck Vanco trough level at steady state F/u Scr F/u Blood Cx and sensitivity of Proteus growing in Urine  Cx  Garnet Sierras PharmD 07/20/2015,6:52 AM

## 2015-07-20 NOTE — Progress Notes (Signed)
CRITICAL VALUE ALERT  Critical value received:  Vanc Trough    Date of notification: 07/20/15   Time of notification:  0645  Critical value read back,. YES  Nurse who received alert:  Theodosia Quay RN   MD notified (1st page):  Notified Pharmacy   Time of first page:  [phone]   MD notified (2nd page):  Time of second page:  Responding MD:  Time MD responded:  Stop vanc at this time.
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
## 2015-07-21 LAB — URINE CULTURE: SPECIAL REQUESTS: NORMAL

## 2015-07-23 LAB — CULTURE, BLOOD (ROUTINE X 2): CULTURE: NO GROWTH

## 2016-07-21 ENCOUNTER — Encounter: Payer: Self-pay | Admitting: Family Medicine

## 2016-07-21 ENCOUNTER — Ambulatory Visit (INDEPENDENT_AMBULATORY_CARE_PROVIDER_SITE_OTHER): Payer: Medicaid Other | Admitting: Family Medicine

## 2016-07-21 VITALS — BP 110/71 | HR 91 | Temp 98.3°F | Wt 200.0 lb

## 2016-07-21 DIAGNOSIS — G8220 Paraplegia, unspecified: Secondary | ICD-10-CM

## 2016-07-21 DIAGNOSIS — Z978 Presence of other specified devices: Secondary | ICD-10-CM | POA: Insufficient documentation

## 2016-07-21 DIAGNOSIS — S41109A Unspecified open wound of unspecified upper arm, initial encounter: Secondary | ICD-10-CM

## 2016-07-21 DIAGNOSIS — Z9289 Personal history of other medical treatment: Secondary | ICD-10-CM | POA: Diagnosis not present

## 2016-07-21 DIAGNOSIS — L98499 Non-pressure chronic ulcer of skin of other sites with unspecified severity: Secondary | ICD-10-CM

## 2016-07-21 DIAGNOSIS — Z960 Presence of urogenital implants: Secondary | ICD-10-CM

## 2016-07-21 DIAGNOSIS — S41102A Unspecified open wound of left upper arm, initial encounter: Secondary | ICD-10-CM | POA: Diagnosis not present

## 2016-07-21 HISTORY — DX: Unspecified open wound of unspecified upper arm, initial encounter: S41.109A

## 2016-07-21 MED ORDER — ZINC SULFATE 220 (50 ZN) MG PO CAPS: 220.0000 mg | ORAL_CAPSULE | Freq: Every day | ORAL | 4 refills | Status: DC

## 2016-07-21 MED ORDER — OMEPRAZOLE 20 MG PO CPDR: 20.0000 mg | DELAYED_RELEASE_CAPSULE | Freq: Every day | ORAL | 12 refills | Status: DC
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
## 2016-07-21 NOTE — Assessment & Plan Note (Addendum)
 -  referral to home health made today

## 2016-07-21 NOTE — Assessment & Plan Note (Addendum)
  Patient out of Foley replacements, due to be changed in about a week, concerning for infection. Unable to get urine sample today that is not contaminated due to lack of supplies in clinic, unable to obtain Foley from hospital or surgical center next door. Hesitant to treat without knowing cause of infection.  -strict ED precautions given -will contact him after weekend to ensure home health has contacted or seen him and have ordered  New supplies -patient has difficulty getting transportation but he will try to arrange a ride to clinic next week to obtain clean urine sample/replace Foley, if transportation is an issue I plan to get social work involved to help get him here -will follow closely

## 2016-07-21 NOTE — Assessment & Plan Note (Signed)
  Sites at ankle and hips not currently concerning for active infection but patient would benefit for home wound care  -New referral made for home health -Will follow closely along, patient may need referral to wound care center in the future

## 2016-07-21 NOTE — Assessment & Plan Note (Signed)
Chronic wound, not concerning for active infection, most closely resembles contact dermatitis  -two 23mm punch biopsies taken at different sites, sent to derm path -will call patient with results, treat according to path results

## 2016-07-24 ENCOUNTER — Telehealth: Payer: Self-pay | Admitting: Family Medicine

## 2016-07-24 NOTE — Telephone Encounter (Signed)
  Patient was contacted by home health Friday, they are to call him today but have not done so yet. Has not received a new Foley. He is unable to come into the clinic in the next few days to have [place] change the Foley here and get a urine sample. However, he is feeling better he is trying to drink more water and has taken more cranberry pills.   He understands to go to ED if he feels he is having a worsening infection. He feels well today.   I will call him again once his biopsy results come in, and check again to see if home health has gotten his supplies.

## 2016-07-26 ENCOUNTER — Telehealth: Payer: Self-pay | Admitting: Family Medicine

## 2016-07-27 NOTE — Telephone Encounter (Addendum)
  Left message for patient to call back, told him to please update [place] with how he is feeling and if home health has been able to see him and get him new supplies, especially new Foley. Relayed that I would like to prescribe a cream for the rash on his arm. Asked him to make follow up appointment in message. If he calls back please try to schedule him for follow up, although I know it is difficult for him to come to clinic.

## 2016-08-15 ENCOUNTER — Telehealth: Payer: Self-pay | Admitting: Family Medicine

## 2016-08-15 DIAGNOSIS — L309 Dermatitis, unspecified: Secondary | ICD-10-CM

## 2016-08-15 NOTE — Telephone Encounter (Signed)
wouild like results from biospy done 07-21-16.  Home health did come out but he didn't come back for bladder infection. Pt says he flushed out his bladder infection.

## 2016-08-16 ENCOUNTER — Telehealth: Payer: Self-pay | Admitting: Family Medicine

## 2016-08-16 DIAGNOSIS — M62838 Other muscle spasm: Secondary | ICD-10-CM

## 2016-08-16 NOTE — Telephone Encounter (Signed)
Called patient back about muscle tightness and made referral to PMR for him. Patient is happy with this decision and again declined coming back to clinic and will make appointments as needed.

## 2016-08-16 NOTE — Telephone Encounter (Signed)
Spoke with patient about his dermatology results and informed him that a dermatologist would be best suited to treat the rash on his arm/stomach. He agreed to referral and this was ordered. He does not want to schedule an appointment at this time.  Asking for some referral for a doctor to help with "muscle contractions" in his legs. He tries to stretch them himself at home but they tighten up. He had seen rehab in [place] for a similar problem a few years ago but never heard back from them. He said his home health benefits for PT are very limited with his insurance. I told him I would ask supervising doctor for advice and get back to him with a better plan.

## 2016-08-29 ENCOUNTER — Telehealth: Payer: Self-pay | Admitting: *Deleted

## 2016-08-29 NOTE — Telephone Encounter (Signed)
Angelena Sole, RN with [facility] called to request verbal orders.  She is requesting to extend nursing care for once a week x 4 wks for wounds on patient's ankles.  Please give her a call at [phone].  Derl Barrow, RN

## 2016-11-02 ENCOUNTER — Telehealth: Payer: Self-pay | Admitting: *Deleted

## 2016-11-02 ENCOUNTER — Telehealth: Payer: Self-pay | Admitting: Family Medicine

## 2016-11-02 NOTE — Telephone Encounter (Signed)
Patient concerned he has UTI. Advised him to come in to be seen for urinalysis/cultures to get the appropriate medicine to be treated. He feels his bladder is spasming, he is having diaphoresis and headaches. Denies fevers, chills. He denies weakness. He is asking for a 7 day course of antibiotics but would not be able to get it until Saturday. He is unable to get into clinic. He has no ride to clinic or the ED. He last changed his catheter 10 days ago and has flushed it. He is drinking water and cranberry juice to try to help but this is not helping. He does not want to bother his mom for a ride.   He agreed to call his mom to ask her for a ride to clinic, urgent care, or the ED. I will call him back to follow up.

## 2016-11-02 NOTE — Telephone Encounter (Signed)
Will wait to hear back from patient on whether he can get a ride to our office today. Jazmin Hartsell,CMA

## 2016-11-02 NOTE — Telephone Encounter (Signed)
Will forward to MD to advise. Kamillah Didonato,CMA  

## 2016-11-02 NOTE — Telephone Encounter (Signed)
-----   Message from [name], DO sent at 11/02/2016  9:54 AM EST -----  Spoke with Sean Horton he cannot get in to be seen due to transportation issues but he really needs to be evaluated by [place], urgent care, or the ED to treat his possible UTI. Told him this. He is trying to arrange transportation. If he calls for appointment please accommodate him today

## 2016-11-02 NOTE — Telephone Encounter (Signed)
Pt would like PCP to call something in for a UTI. Pt uses [facility] [address] in [place]. Please advise. Thanks! ep

## 2016-11-03 ENCOUNTER — Telehealth: Payer: Self-pay | Admitting: Licensed Clinical Social Worker

## 2016-11-03 NOTE — Progress Notes (Signed)
Social work consult from Dr. Vanetta Shawl, patient is a paraplegic has possible UTI and does not have a ride to the office.  Called patient to assess needs for the above consult.  Patient arranged for a ride to the office next Wed.  He has not called to schedule an appointment.  LCSW discussed transportation barriers and explained Medicaid transportation. Patient's family usually assist him with transportation, however, he has to plan for a ride in advance. Patient explained why he was unable to utilize Medicaid Transportation at this time.    Plan: Patient will call office to schedule an appointment with PCP next wed.  Casimer Lanius, LCSW Licensed Clinical Social Worker [facility]   [phone] 12:23 PM

## 2016-11-08 ENCOUNTER — Telehealth: Payer: Self-pay | Admitting: Family Medicine

## 2016-11-08 ENCOUNTER — Other Ambulatory Visit (INDEPENDENT_AMBULATORY_CARE_PROVIDER_SITE_OTHER): Payer: Medicaid Other

## 2016-11-08 DIAGNOSIS — R300 Dysuria: Secondary | ICD-10-CM | POA: Diagnosis present

## 2016-11-08 LAB — POCT UA - MICROSCOPIC ONLY

## 2016-11-08 LAB — POCT URINALYSIS DIPSTICK
BILIRUBIN UA: NEGATIVE
GLUCOSE UA: NEGATIVE
NITRITE UA: POSITIVE
Protein, UA: >=300
Spec Grav, UA: >=1.03
Urobilinogen, UA: 1
pH, UA: 6.5

## 2016-11-08 NOTE — Telephone Encounter (Signed)
  Spoke with Sean Horton today- told him he needs to be seen by a provider, which I had explained to him on the phone last week. Explained that he needs to be clinically evaluated to know if this is a UTI, bladder spasm, what his vitals are, etc. He is unable to come in for an appointment due to transportation issues. He did give a urine sample today at clinic but this was from his Foley bag but I explained this sample is going to be contaminated and will not be able to give [place] adequate information about what is going on with him. He is unable to be seen in clinic or at urgent care/ED due to transportation issues. He has appointment with me on 12/27 but I feel this is too far out for what is going on with him. I urged him to call EMS to come to ED for evaluation. He said he would consider. Will follow up with urine culture although I suspect this will just be contaminated.

## 2016-11-10 MED ORDER — CIPROFLOXACIN HCL 500 MG PO TABS: 500.0000 mg | ORAL_TABLET | Freq: Two times a day (BID) | ORAL | 0 refills | Status: DC

## 2016-11-10 NOTE — Addendum Note (Signed)
Addended by: Lucila Maine C on: 11/10/2016 04:17 PM   Modules accepted: Orders

## 2016-11-10 NOTE — Progress Notes (Signed)
  Called in prescription for Ciprofloxacin 10 day course for patient. Advised to go to ED via EMS if he worsens. He is to follow up as scheduled.

## 2016-11-11 LAB — URINE CULTURE

## 2016-11-13 ENCOUNTER — Telehealth: Payer: Self-pay | Admitting: Family Medicine

## 2016-11-13 NOTE — Telephone Encounter (Signed)
Spoke with him via phone  He feels well without any symptoms now.  He was feeling tired and had bladder spasms.  But never any fever or back ache or nausea and vomiting.  His symptoms started improving before he got the cipro the believes because he was drinking more.    We decided since did not seem to have pyelo and is asymptomatic now to continue the cipro even thougth his culture shows resistance.   If his symptoms recur he will call us immediately and would need to come in for Rocephin IM and chnge to another oral antibiotic

## 2016-11-22 ENCOUNTER — Telehealth: Payer: Self-pay

## 2016-11-22 ENCOUNTER — Ambulatory Visit (INDEPENDENT_AMBULATORY_CARE_PROVIDER_SITE_OTHER): Payer: Medicaid Other | Admitting: Family Medicine

## 2016-11-22 ENCOUNTER — Encounter: Payer: Self-pay | Admitting: Family Medicine

## 2016-11-22 VITALS — BP 110/60 | HR 85 | Temp 98.7°F | Wt 200.0 lb

## 2016-11-22 DIAGNOSIS — G8220 Paraplegia, unspecified: Secondary | ICD-10-CM

## 2016-11-22 DIAGNOSIS — Z978 Presence of other specified devices: Secondary | ICD-10-CM

## 2016-11-22 DIAGNOSIS — Z9289 Personal history of other medical treatment: Secondary | ICD-10-CM | POA: Diagnosis not present

## 2016-11-22 DIAGNOSIS — Z960 Presence of urogenital implants: Secondary | ICD-10-CM

## 2016-11-22 NOTE — Patient Instructions (Signed)
  Please come back in 3 months for annual physical exam.

## 2016-11-22 NOTE — Assessment & Plan Note (Signed)
  No current signs of infection, just finished 10 day course of Cipro  -return precautions given for bladder spasm, fever, chills, palpitations, sweating

## 2016-11-22 NOTE — Telephone Encounter (Signed)
Office Visit notes from today were faxed to Loyal Buba with [facility]. [phone]). Ottis Stain, CMA
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
## 2017-01-24 ENCOUNTER — Ambulatory Visit (INDEPENDENT_AMBULATORY_CARE_PROVIDER_SITE_OTHER): Payer: Self-pay | Admitting: Orthopedic Surgery

## 2017-01-30 ENCOUNTER — Telehealth: Payer: Self-pay | Admitting: Family Medicine

## 2017-01-30 NOTE — Telephone Encounter (Signed)
pt calling to request refill of:  Name of Medication(s):  ibuprofen & something for alleriges Last date of OV: 11-22-16 Pharmacy:  [facility]  Will route refill request to Clinic RN.  Discussed with patient policy to call pharmacy for future refills.  Also, discussed refills may take up to 48 hours to approve or deny.  Renella Cunas

## 2017-01-30 NOTE — Telephone Encounter (Signed)
Medications are not listed on current med list.  Derl Barrow, RN

## 2017-01-31 ENCOUNTER — Other Ambulatory Visit: Payer: Self-pay | Admitting: Family Medicine

## 2017-01-31 MED ORDER — IBUPROFEN 600 MG PO TABS: 600.0000 mg | ORAL_TABLET | Freq: Three times a day (TID) | ORAL | 0 refills | Status: DC | PRN

## 2017-01-31 MED ORDER — LORATADINE 10 MG PO TABS: 10.0000 mg | ORAL_TABLET | Freq: Every day | ORAL | 0 refills | Status: DC

## 2017-01-31 NOTE — Progress Notes (Signed)
Spoke with patient and appt was scheduled to follow up with on 03/22/17. Jazmin Hartsell,CMA

## 2017-01-31 NOTE — Telephone Encounter (Signed)
Sent in Rx for Claritin and Ibuprofen.

## 2017-03-07 ENCOUNTER — Other Ambulatory Visit: Payer: Self-pay | Admitting: Family Medicine

## 2017-03-07 NOTE — Telephone Encounter (Signed)
Pt  calling to request refill of:  Name of Medication(s):  Ibuprofen and loratadine with refills  Last date of OV:  11-22-16 Pharmacy:  [facility]  Will route refill request to Clinic RN.  Discussed with patient policy to call pharmacy for future refills.  Also, discussed refills may take up to 48 hours to approve or deny.  Renella Cunas

## 2017-03-08 MED ORDER — IBUPROFEN 600 MG PO TABS: 600.0000 mg | ORAL_TABLET | Freq: Three times a day (TID) | ORAL | 0 refills | Status: DC | PRN

## 2017-03-08 MED ORDER — LORATADINE 10 MG PO TABS: 10.0000 mg | ORAL_TABLET | Freq: Every day | ORAL | 0 refills | Status: DC

## 2017-03-08 NOTE — Telephone Encounter (Signed)
Patient calls again, he states he really needs these medications sent in asap. Since running out, he has had HAs everyday. Please send in asap.

## 2017-03-22 ENCOUNTER — Ambulatory Visit: Payer: Medicaid Other | Admitting: Family Medicine

## 2017-03-22 ENCOUNTER — Telehealth: Payer: Self-pay | Admitting: Family Medicine

## 2017-03-22 ENCOUNTER — Telehealth: Payer: Self-pay

## 2017-03-22 NOTE — Telephone Encounter (Signed)
Pt requesting Antibiotic for UTI. Please call pt at [phone] and let him know when or if this is done. Ottis Stain, CMA

## 2017-03-22 NOTE — Telephone Encounter (Signed)
He's coming in to be seen today, can we please confirm his appointment? I'll test urine at this appointment.

## 2017-03-22 NOTE — Telephone Encounter (Signed)
He had to cancel his appt. His ride cancelled on him this morning. Ottis Stain, CMA

## 2017-03-22 NOTE — Telephone Encounter (Signed)
Stressed to Sean Horton that I cannot give him antibiotics without testing his urine. His ride cancelled on him this morning. He does not want social work to help him get a ride. Denies fevers or chills. He feels his bladder is spasming and that's why he knows it's a UTI. He states he will just drink water to try to flush it out. He will call 911 if he gets worse to go to the ED.   Expressed that he can always be seen at our clinic for a walk in visit or same day appointment. Asked him to please call back if he has other needs or concerns.

## 2017-04-04 NOTE — Telephone Encounter (Signed)
See other message on 4/26 from Dr. Vanetta Shawl. Ottis Stain, CMA

## 2017-04-10 ENCOUNTER — Ambulatory Visit (INDEPENDENT_AMBULATORY_CARE_PROVIDER_SITE_OTHER): Payer: Medicaid Other | Admitting: Family Medicine

## 2017-04-10 ENCOUNTER — Encounter: Payer: Self-pay | Admitting: Licensed Clinical Social Worker

## 2017-04-10 ENCOUNTER — Encounter: Payer: Self-pay | Admitting: Family Medicine

## 2017-04-10 VITALS — BP 110/72 | HR 99 | Temp 98.2°F | Ht 77.0 in

## 2017-04-10 DIAGNOSIS — L98499 Non-pressure chronic ulcer of skin of other sites with unspecified severity: Secondary | ICD-10-CM

## 2017-04-10 DIAGNOSIS — J301 Allergic rhinitis due to pollen: Secondary | ICD-10-CM

## 2017-04-10 DIAGNOSIS — N3289 Other specified disorders of bladder: Secondary | ICD-10-CM

## 2017-04-10 MED ORDER — IBUPROFEN 800 MG PO TABS: 800.0000 mg | ORAL_TABLET | Freq: Three times a day (TID) | ORAL | 3 refills | Status: DC | PRN

## 2017-04-10 MED ORDER — MIRABEGRON ER 25 MG PO TB24: 25.0000 mg | ORAL_TABLET | Freq: Every day | ORAL | 0 refills | Status: DC

## 2017-04-10 MED ORDER — FEXOFENADINE-PSEUDOEPHED ER 180-240 MG PO TB24: 1.0000 | ORAL_TABLET | Freq: Every day | ORAL | 3 refills | Status: DC

## 2017-04-10 NOTE — Progress Notes (Signed)
[facility] Initial Visit  MRN: [id number] Name: DAYSEN GUNDRUM  Total time: 15 minutes Type of Service: [place] Interpretor:No. Interpretor Name and Language: NA  Warm Hand Off Completed.     SUBJECTIVE: NAM VOSSLER is a 31 y.o. male referred by Dr. Vanetta Shawl for transportation concerns and [place].Patient reports the following concerns: needing help with ADL's and transportation when his family is unable to transport him to appointments.Duration of problem: several months; Severity of problem: moderate  OBJECTIVE: Mood: Euthymic and Affect: Appropriate  LIFE CONTEXT: Family and Social: lives alone, has family and friend support. Per patient he has a large dog with separation anxiety.  Dog does not like to be left alone.  When patient goes to appointments he has to also find someone to sit with the dog or they have to take the dog with them.    School/Work: disability.pleasant paraplegic in wheelchair Self-Care: enjoys his dog Life Changes: mother is no longer able to assist patient   GOALS ADDRESSED: Patient will : Increase adequate support systems for patient/family and f/u on transportation resources provided.   INTERVENTIONS: Link to [employer]    ASSESSMENT: Patient currently not going to appointments due to transportation barriers and not having a consistent sitter for his dog. Patient's dog is very important to him and he is uncomfortable leaving the dog without a sitter as he chewed the cage once.   Patient may benefit from  [place] to assist with ADL's due to his mother no longer able to assist him. LCSW reviewed medicaid transportation and PCS process. Patient is interested in [facility] and would like [place].  LCSW provided Dr. Vanetta Shawl the [place] referral to complete.   PLAN: 1. LCSW will  fax referral to [facility] once completed by PCP 2. Will f/u with [facility] 3 to 5 days to confirm receipt and processed.  3. LCSW will f/u with patient [phone] in 3 to 5 business days to provide an update.  4. Patient will call DSS to set up Medicaid Transportation.  Casimer Lanius, LCSW Licensed Clinical Social Worker [facility]   [phone] 3:37 PM

## 2017-04-10 NOTE — Assessment & Plan Note (Addendum)
  Chronic, poorly controlled. Has tried Claritin in past with no relief  -rx sent in for allegra-D -follow up if worsens or fails to improve

## 2017-04-10 NOTE — Progress Notes (Signed)
    Subjective:    Patient ID: MARKELLE NAJARIAN, male    DOB: 1986/06/18, 31 y.o.   MRN: [id number]   CC: follow up   Rash Persistent but has began to heal. Was unable to get to dermatology appointment due to no transportation. Uses cream on it occasionally. Has stopped picking at it and this has improved it slightly. Denies pain or drainage from wounds.  Indwelling Foley Changed Foley last week. Has not had any urinary symptoms of spasms since then. He uses baclofen 50m twice a day but this does not prevent spasms. He can get rid of UTI's by drinking a lot of water and taking cranberry pills.   Transportation issues Mom broke her ankle and has a hard time driving him around. His aunt is sometimes available. He has refused transportation services in the past but will consider them now. His biggest worry is driving with someone he doesn't know and it being awkward. He dislikes feeling like a burden on his family.   Follows with pain clinic. Was referred to ortho for chronic left hip wound.   Smoking status reviewed- non-smoker   Objective:  BP 110/72   Pulse 99   Temp 98.2 F (36.8 C) (Oral)   Ht _0  (1.956 m)   SpO2 (!) 89%  Vitals and nursing note reviewed  General: pleasant paraplegic man in wheelchair, well nourished, in no acute distress Cardiac: RRR, clear S1 and S2, no murmurs, rubs, or gallops Respiratory: clear to auscultation bilaterally, no increased work of breathing Skin: warm and dry, large ulcerated rash on posterior left arm and axilla with evidence of scarring. Crusting lesions present. No discharge. Similar lesions on left lateral abdomen.  Neuro: paralyzed, wheelchair bound  Assessment & Plan:    Bladder spasm  Chronic, not well controlled. Currently on 40 mg baclofen BID  -continue baclofen -rx sent for Myrbetriq to take as needed for spasms -follow up if spasms worsen or do not improve with new medication  Non-healing ulcer, multiple  sites.  Chronic has been there for past 1.5-2 years, [facility] to [facility] getting a [facility] there  -referral made to wound care center today -CSW DCasimer Laniusspoke with patient today about getting him set up with medicaid ride service, she will follow up with him via phone in several days -patient will likely need long term care for wounds with debridement and possibly surgical intervention -no need for antibiotics at this time  Allergic rhinitis  Chronic, poorly controlled. Has tried Claritin in past with no relief  -rx sent in for allegra-D -follow up if worsens or fails to improve  Transportation Needs Met with CSW to initiate medicaid transportation forms  HH needs Order for PCS aid, form filled out today  Return in about 3 months (around 07/11/2017), or if symptoms worsen or fail to improve.   ALucila Maine DO Family Medicine Resident PGY-1

## 2017-04-10 NOTE — Assessment & Plan Note (Signed)
  Chronic has been there for past 1.5-2 years, had biopsied in past and referred to [facility] but patient missed transportation due to issues getting a ride there  -referral made to wound care center today -CSW Casimer Lanius spoke with patient today about getting him set up with medicaid ride service, she will follow up with him via phone in several days -patient will likely need long term care for wounds with debridement and possibly surgical intervention -no need for antibiotics at this time

## 2017-04-10 NOTE — Assessment & Plan Note (Signed)
  Chronic, not well controlled. Currently on 40 mg baclofen BID  -continue baclofen -rx sent for Myrbetriq to take as needed for spasms -follow up if spasms worsen or do not improve with new medication

## 2017-04-10 NOTE — Patient Instructions (Signed)
  Call us if you need anything!! [phone]  I'll refer you to a wound clinic, but please call the transportation line to get a ride set up.  Neoma Laming will reach out to you about the home health aide

## 2017-04-12 ENCOUNTER — Telehealth: Payer: Self-pay | Admitting: *Deleted

## 2017-04-12 NOTE — Telephone Encounter (Signed)
Prior Authorization received from [employer] for [employer]. Formulary and PA form placed in provider box for completion. Derl Barrow, RN

## 2017-04-13 ENCOUNTER — Telehealth: Payer: Self-pay | Admitting: Licensed Clinical Social Worker

## 2017-04-13 NOTE — Progress Notes (Signed)
LCSW called [facility] [phone] to verify receipt and process of patient's PCS application.  Per Linton Rump will contact patient to schedule an assessment, however patient is welcome to contact them.   Called patient to provide an update.  LCSW provided patient with phone number to [facility] in the event patient wants to call and schedule the assessment.  LCSW also informed patient of his appointment Friday May 25th with [place]. Maribel Laclede. Provided phone number for patient to call in the event he was unable to keep this appointment time.  Plan:  1. Patient will call [facility] to schedule an assessment 2. Patient will call [name] to see if they have an afternoon appointment 3. LCSW will f/u with patient in 3 to 5 business days to assess for barriers to his appointment.  Casimer Lanius, LCSW Licensed Clinical Social Worker [facility]   [phone] 3:49 PM

## 2017-04-16 NOTE — Telephone Encounter (Signed)
3rd request for PA.  Derl Barrow, RN

## 2017-04-16 NOTE — Telephone Encounter (Signed)
Second PA request for Myrbetriq.  Please advise.  Derl Barrow, RN

## 2017-04-17 NOTE — Telephone Encounter (Signed)
Attempted to call pt, however, no answer. VM was left asking for a return call.

## 2017-04-17 NOTE — Telephone Encounter (Signed)
Please call patient and ask him if he has ever been on oxybutynin or ditropan in the past. I am covering Dr. Stefano Gaul inbox. I see the prior auth form, but I also see that oxybutynin would be preferred. If he hasn't tried this, I precepted with Dr. Andria Frames and we could try this first for his bladder spasms.

## 2017-04-17 NOTE — Telephone Encounter (Signed)
I am covering Dr. Stefano Gaul box and was on nights. Will be there to sign it this morning.

## 2017-04-18 ENCOUNTER — Telehealth: Payer: Self-pay | Admitting: Family Medicine

## 2017-04-18 ENCOUNTER — Telehealth: Payer: Self-pay | Admitting: Licensed Clinical Social Worker

## 2017-04-18 DIAGNOSIS — L98499 Non-pressure chronic ulcer of skin of other sites with unspecified severity: Secondary | ICD-10-CM

## 2017-04-18 MED ORDER — OXYBUTYNIN CHLORIDE ER 5 MG PO TB24: 5.0000 mg | ORAL_TABLET | Freq: Every day | ORAL | 0 refills | Status: DC

## 2017-04-18 NOTE — Telephone Encounter (Signed)
Order signed and faxed. Tearra Ouk,CMA

## 2017-04-18 NOTE — Addendum Note (Signed)
Addended by: Glenis Smoker on: 04/18/2017 11:56 AM   Modules accepted: Orders

## 2017-04-18 NOTE — Progress Notes (Signed)
F/U call ref [place] Appointment.   Per patient, his Personal Care assessment is scheduled for May 30th.  LCSW reminded patient of his wound care appointment 04/20/17.  Patient again verbalized concerns with wound care appointment time.  LCSW discussed importance of rescheduling the appointment if the current day and time would not work with his transportation.  Patient verbalized understanding and states he will call.  Casimer Lanius, LCSW Licensed Clinical Social Worker [facility]   [phone] 12:15 PM

## 2017-04-18 NOTE — Telephone Encounter (Signed)
Spoke with patient and he believes he has taken ditropan in the past and is fine with trying the oxybutynin now if he needs to.  Will forward to MD to advise. Jazmin Hartsell,CMA

## 2017-04-18 NOTE — Telephone Encounter (Signed)
Pt needs wound packing supplies. Pt needs gauze 4x4 and 2x2, 3 or 4 inch tape and the smaller width tape, large leg bags that hold 32 ounces of fluid, Wound Cleaning spray. Pt says supplies usually come from [place]. ep

## 2017-04-18 NOTE — Telephone Encounter (Signed)
Will forward to MD. Ithzel Fedorchak,CMA  

## 2017-04-18 NOTE — Telephone Encounter (Signed)
Ordered ditropan 5mg  for patient to try for bladder spasms. Also ordered DME other for wound supplies. Cosigned DME order to Dr. Nori Riis as she is precepting today and should be available to sign for DME if necessary. Thanks blue team!

## 2017-04-20 ENCOUNTER — Encounter (HOSPITAL_BASED_OUTPATIENT_CLINIC_OR_DEPARTMENT_OTHER): Payer: Medicaid Other

## 2017-04-20 NOTE — Telephone Encounter (Signed)
Spoke with pharmacy and informed them that we received another PA for the non preferred medication even though we sent a new script for oxybutynin.  Tech said that maybe it is the extended release of this that requires PA too.  Will wait to get fax and put papers in providers box. Jazmin Hartsell,CMA

## 2017-04-30 ENCOUNTER — Other Ambulatory Visit: Payer: Self-pay | Admitting: Family Medicine

## 2017-04-30 ENCOUNTER — Telehealth: Payer: Self-pay | Admitting: *Deleted

## 2017-04-30 DIAGNOSIS — N319 Neuromuscular dysfunction of bladder, unspecified: Secondary | ICD-10-CM

## 2017-04-30 MED ORDER — OXYBUTYNIN CHLORIDE 5 MG PO TABS: 5.0000 mg | ORAL_TABLET | Freq: Three times a day (TID) | ORAL | Status: DC | PRN

## 2017-04-30 NOTE — Telephone Encounter (Signed)
  Cancelled Rx for extended release form and sent in Rx for immediate release which should not require prior auth.

## 2017-04-30 NOTE — Telephone Encounter (Signed)
Prior Authorization received from [place] for oxybutynin ER 5mg . Formulary and PA form placed in provider box for completion. Derl Barrow, RN

## 2017-04-30 NOTE — Telephone Encounter (Signed)
Gave form to PCP, who will either complete prior auth or give immediate release form, which appears to be approved.

## 2017-05-07 ENCOUNTER — Other Ambulatory Visit: Payer: Self-pay | Admitting: Family Medicine

## 2017-05-07 ENCOUNTER — Telehealth: Payer: Self-pay | Admitting: Family Medicine

## 2017-05-07 MED ORDER — OXYBUTYNIN CHLORIDE 5 MG PO TABS: 5.0000 mg | ORAL_TABLET | Freq: Three times a day (TID) | ORAL | 3 refills | Status: DC

## 2017-05-07 NOTE — Telephone Encounter (Signed)
Will forward to MD to advise. Jazmin Hartsell,CMA  

## 2017-05-07 NOTE — Telephone Encounter (Signed)
My mistake- I sent the wrong medication to the pharmacy. Just resent a prescription for Oxybutynin immediate release 5mg  to take TID, this medication should not need prior auth. Please let patient know. Thank you

## 2017-05-07 NOTE — Telephone Encounter (Signed)
Pharmacy is telling pt they haven't received either the prior authorization for oxybutynin nor the new prescription.  [facility] [address] in [place]. Please advise

## 2017-05-07 NOTE — Telephone Encounter (Signed)
Pt contacted and informed.

## 2017-05-08 ENCOUNTER — Encounter (HOSPITAL_BASED_OUTPATIENT_CLINIC_OR_DEPARTMENT_OTHER): Payer: Medicaid Other | Attending: Surgery

## 2017-05-08 ENCOUNTER — Ambulatory Visit: Payer: Medicaid Other | Admitting: Family Medicine

## 2017-05-08 DIAGNOSIS — L89522 Pressure ulcer of left ankle, stage 2: Secondary | ICD-10-CM | POA: Diagnosis present

## 2017-05-08 DIAGNOSIS — F17290 Nicotine dependence, other tobacco product, uncomplicated: Secondary | ICD-10-CM | POA: Insufficient documentation

## 2017-05-08 DIAGNOSIS — L89222 Pressure ulcer of left hip, stage 2: Secondary | ICD-10-CM | POA: Insufficient documentation

## 2017-05-08 DIAGNOSIS — G8220 Paraplegia, unspecified: Secondary | ICD-10-CM | POA: Diagnosis not present

## 2017-05-08 DIAGNOSIS — L89512 Pressure ulcer of right ankle, stage 2: Secondary | ICD-10-CM | POA: Diagnosis not present

## 2017-05-08 DIAGNOSIS — L89890 Pressure ulcer of other site, unstageable: Secondary | ICD-10-CM | POA: Insufficient documentation

## 2017-05-08 DIAGNOSIS — G629 Polyneuropathy, unspecified: Secondary | ICD-10-CM | POA: Insufficient documentation

## 2017-05-08 DIAGNOSIS — S41002A Unspecified open wound of left shoulder, initial encounter: Secondary | ICD-10-CM | POA: Insufficient documentation

## 2017-05-08 DIAGNOSIS — S31109A Unspecified open wound of abdominal wall, unspecified quadrant without penetration into peritoneal cavity, initial encounter: Secondary | ICD-10-CM | POA: Insufficient documentation

## 2017-05-08 DIAGNOSIS — X58XXXA Exposure to other specified factors, initial encounter: Secondary | ICD-10-CM | POA: Diagnosis not present

## 2017-05-08 DIAGNOSIS — L89152 Pressure ulcer of sacral region, stage 2: Secondary | ICD-10-CM | POA: Insufficient documentation

## 2017-05-08 DIAGNOSIS — L89112 Pressure ulcer of right upper back, stage 2: Secondary | ICD-10-CM | POA: Diagnosis not present

## 2017-05-09 ENCOUNTER — Encounter: Payer: Self-pay | Admitting: Student

## 2017-05-09 ENCOUNTER — Ambulatory Visit (HOSPITAL_COMMUNITY)
Admission: RE | Admit: 2017-05-09 | Discharge: 2017-05-09 | Disposition: A | Discharge status: Home / Self Care | Payer: Medicaid Other | Source: Ambulatory Visit | Attending: Surgery | Admitting: Surgery

## 2017-05-09 ENCOUNTER — Other Ambulatory Visit: Payer: Self-pay | Admitting: Family Medicine

## 2017-05-09 ENCOUNTER — Telehealth: Payer: Self-pay

## 2017-05-09 ENCOUNTER — Other Ambulatory Visit: Payer: Self-pay | Admitting: Surgery

## 2017-05-09 ENCOUNTER — Ambulatory Visit (INDEPENDENT_AMBULATORY_CARE_PROVIDER_SITE_OTHER): Payer: Medicaid Other | Admitting: Student

## 2017-05-09 VITALS — BP 100/60 | HR 103 | Temp 98.1°F

## 2017-05-09 DIAGNOSIS — G8220 Paraplegia, unspecified: Secondary | ICD-10-CM

## 2017-05-09 DIAGNOSIS — N390 Urinary tract infection, site not specified: Secondary | ICD-10-CM | POA: Diagnosis not present

## 2017-05-09 DIAGNOSIS — T169XXA Foreign body in ear, unspecified ear, initial encounter: Secondary | ICD-10-CM | POA: Insufficient documentation

## 2017-05-09 DIAGNOSIS — L89222 Pressure ulcer of left hip, stage 2: Secondary | ICD-10-CM | POA: Insufficient documentation

## 2017-05-09 DIAGNOSIS — R399 Unspecified symptoms and signs involving the genitourinary system: Secondary | ICD-10-CM

## 2017-05-09 LAB — POCT URINALYSIS DIP (MANUAL ENTRY)
Glucose, UA: NEGATIVE mg/dL
Nitrite, UA: POSITIVE — AB
UROBILINOGEN UA: 1 U/dL
pH, UA: 6 (ref 5.0–8.0)

## 2017-05-09 MED ORDER — NITROFURANTOIN MONOHYD MACRO 100 MG PO CAPS: 100.0000 mg | ORAL_CAPSULE | Freq: Two times a day (BID) | ORAL | 0 refills | Status: DC

## 2017-05-09 NOTE — Telephone Encounter (Signed)
[facility] has been notified. Staff message sent to [facility].

## 2017-05-09 NOTE — Telephone Encounter (Signed)
Pt persistent here in clinic for UTI- pt is requesting tape 2" and 4", 4x4, 2x2, guaze, skip prep materials, catheters, 32 fluid oz leg bags. Place DME orders and I can contact [facility].

## 2017-05-09 NOTE — Progress Notes (Signed)
   Subjective:    Patient ID: Sean Horton, male    DOB: July 04, 1986, 31 y.o.   MRN: [id number]   CC: concern for UTI, ear irritation  HPI: 31 y/o paraplegic presents for concern for UTI and right ear irritation  UTI - has has fever, Tmax 102, bladder spams for the past 4-5 days - no abdominal pain as he cannot feel the great majority of his abdomen - no back pain, he can feel from his mid back up - he does have a chronic indwelling catheter, last changed 2 weeks ago - last took medication for fever last night  Smoking status reviewed  Review of Systems  Per HPI, else denies chest pain, shortness of breath    Objective:  BP 100/60   Pulse (!) 103   Temp 98.1 F (36.7 C) (Oral)   SpO2 99%  Vitals and nursing note reviewed  General: NAD HEENT: Normal TMs bilaterally, short hairs in bilateral ear canals, no erythema, lesions or swelling Cardiac: RRR, Respiratory: CTAB, normal effort Abdomen: soft, nontender, nondistended MSK: no CVA tenderness he is able to feel over the CVA region Skin: warm and dry, no rashes noted Neuro: alert and oriented, no focal deficits   Assessment & Plan:    Recurrent UTI Has an indwelling catheter with recurrent UTIs. UA is significant for UTI. No CVA tenderness, afebrile on exam. His last UTI was in 10/2016, was e coli resistant to several antibiotics - will treat with Macrobid x14 days - follow culture and tailor regimen as needed - he was counseled to exchange the catheter - strict return precautions given   Foreign body in ear Ears flushed in clinic and hairs successfully removed - follow as needed    Mckyle Solanki A. Lincoln Brigham MD, [place] [facility] Resident PGY-3 Pager [phone]

## 2017-05-09 NOTE — Patient Instructions (Addendum)
Follow up as needed You will need to have your catheter exchanged Take Antibiotic as prescribed You will be called if your antibiotic needs to be changed If you feel your symptoms are getting worse, call the office Call the office at [phone]

## 2017-05-09 NOTE — Assessment & Plan Note (Signed)
Has an indwelling catheter with recurrent UTIs. UA is significant for UTI. No CVA tenderness, afebrile on exam. His last UTI was in 10/2016, was e coli resistant to several antibiotics - will treat with Macrobid x14 days - follow culture and tailor regimen as needed - he was counseled to exchange the catheter - strict return precautions given

## 2017-05-09 NOTE — Telephone Encounter (Signed)
DME supplies ordered

## 2017-05-09 NOTE — Assessment & Plan Note (Signed)
Ears flushed in clinic and hairs successfully removed - follow as needed

## 2017-05-13 LAB — URINE CULTURE

## 2017-05-14 NOTE — Telephone Encounter (Signed)
Patient is aware of results and states that he is already starting to feel better. Sean Horton,CMA

## 2017-05-14 NOTE — Telephone Encounter (Signed)
Please inform the patient that his final urine culture showed the bacteria growing in his urine is sensitive to the antibiotic he was started on and he should complete the antibiotic course

## 2017-05-15 ENCOUNTER — Emergency Department (HOSPITAL_COMMUNITY): Payer: Medicaid Other

## 2017-05-15 ENCOUNTER — Encounter (HOSPITAL_COMMUNITY): Payer: Self-pay | Admitting: Emergency Medicine

## 2017-05-15 ENCOUNTER — Inpatient Hospital Stay (HOSPITAL_COMMUNITY)
Admission: EM | Admit: 2017-05-15 | Discharge: 2017-05-21 | DRG: 856 | Disposition: A | Discharge status: Home Health | Payer: Medicaid Other | Attending: Family Medicine | Admitting: Family Medicine

## 2017-05-15 DIAGNOSIS — M25551 Pain in right hip: Secondary | ICD-10-CM | POA: Diagnosis present

## 2017-05-15 DIAGNOSIS — D509 Iron deficiency anemia, unspecified: Secondary | ICD-10-CM | POA: Diagnosis present

## 2017-05-15 DIAGNOSIS — I10 Essential (primary) hypertension: Secondary | ICD-10-CM | POA: Diagnosis present

## 2017-05-15 DIAGNOSIS — G8220 Paraplegia, unspecified: Secondary | ICD-10-CM | POA: Diagnosis present

## 2017-05-15 DIAGNOSIS — M009 Pyogenic arthritis, unspecified: Secondary | ICD-10-CM | POA: Diagnosis not present

## 2017-05-15 DIAGNOSIS — T814XXA Infection following a procedure, initial encounter: Secondary | ICD-10-CM | POA: Diagnosis not present

## 2017-05-15 DIAGNOSIS — G9050 Complex regional pain syndrome I, unspecified: Secondary | ICD-10-CM | POA: Diagnosis not present

## 2017-05-15 DIAGNOSIS — L8945 Pressure ulcer of contiguous site of back, buttock and hip, unstageable: Secondary | ICD-10-CM | POA: Diagnosis present

## 2017-05-15 DIAGNOSIS — L089 Local infection of the skin and subcutaneous tissue, unspecified: Secondary | ICD-10-CM | POA: Diagnosis not present

## 2017-05-15 DIAGNOSIS — Z87440 Personal history of urinary (tract) infections: Secondary | ICD-10-CM

## 2017-05-15 DIAGNOSIS — N319 Neuromuscular dysfunction of bladder, unspecified: Secondary | ICD-10-CM | POA: Diagnosis not present

## 2017-05-15 DIAGNOSIS — Z66 Do not resuscitate: Secondary | ICD-10-CM | POA: Diagnosis present

## 2017-05-15 DIAGNOSIS — E46 Unspecified protein-calorie malnutrition: Secondary | ICD-10-CM | POA: Diagnosis present

## 2017-05-15 DIAGNOSIS — K219 Gastro-esophageal reflux disease without esophagitis: Secondary | ICD-10-CM | POA: Diagnosis present

## 2017-05-15 DIAGNOSIS — B9620 Unspecified Escherichia coli [E. coli] as the cause of diseases classified elsewhere: Secondary | ICD-10-CM | POA: Diagnosis present

## 2017-05-15 DIAGNOSIS — N3000 Acute cystitis without hematuria: Secondary | ICD-10-CM | POA: Diagnosis not present

## 2017-05-15 DIAGNOSIS — D508 Other iron deficiency anemias: Secondary | ICD-10-CM | POA: Diagnosis not present

## 2017-05-15 DIAGNOSIS — G8929 Other chronic pain: Secondary | ICD-10-CM | POA: Diagnosis not present

## 2017-05-15 DIAGNOSIS — G8921 Chronic pain due to trauma: Secondary | ICD-10-CM | POA: Diagnosis not present

## 2017-05-15 DIAGNOSIS — T148XXA Other injury of unspecified body region, initial encounter: Secondary | ICD-10-CM

## 2017-05-15 DIAGNOSIS — F17210 Nicotine dependence, cigarettes, uncomplicated: Secondary | ICD-10-CM | POA: Diagnosis not present

## 2017-05-15 DIAGNOSIS — A419 Sepsis, unspecified organism: Secondary | ICD-10-CM | POA: Diagnosis present

## 2017-05-15 DIAGNOSIS — Z79899 Other long term (current) drug therapy: Secondary | ICD-10-CM | POA: Diagnosis not present

## 2017-05-15 DIAGNOSIS — B999 Unspecified infectious disease: Secondary | ICD-10-CM

## 2017-05-15 DIAGNOSIS — L02416 Cutaneous abscess of left lower limb: Secondary | ICD-10-CM | POA: Diagnosis not present

## 2017-05-15 DIAGNOSIS — D62 Acute posthemorrhagic anemia: Secondary | ICD-10-CM | POA: Diagnosis not present

## 2017-05-15 DIAGNOSIS — Z79891 Long term (current) use of opiate analgesic: Secondary | ICD-10-CM | POA: Diagnosis not present

## 2017-05-15 DIAGNOSIS — Y838 Other surgical procedures as the cause of abnormal reaction of the patient, or of later complication, without mention of misadventure at the time of the procedure: Secondary | ICD-10-CM | POA: Diagnosis present

## 2017-05-15 DIAGNOSIS — F17290 Nicotine dependence, other tobacco product, uncomplicated: Secondary | ICD-10-CM | POA: Diagnosis present

## 2017-05-15 DIAGNOSIS — D649 Anemia, unspecified: Secondary | ICD-10-CM

## 2017-05-15 HISTORY — DX: Presence of urogenital implants: Z96.0

## 2017-05-15 HISTORY — DX: Presence of other specified devices: Z97.8

## 2017-05-15 HISTORY — DX: Personal history of other medical treatment: Z92.89

## 2017-05-15 HISTORY — DX: Neuromuscular dysfunction of bladder, unspecified: N31.9

## 2017-05-15 HISTORY — DX: Neurogenic bowel, not elsewhere classified: K59.2

## 2017-05-15 HISTORY — DX: Headache, unspecified: R51.9

## 2017-05-15 HISTORY — DX: Unspecified injury at t2-t6 level of thoracic spinal cord, initial encounter: S24.102A

## 2017-05-15 HISTORY — DX: Headache: R51

## 2017-05-15 LAB — CBC WITH DIFFERENTIAL/PLATELET
BASOS ABS: 0 10*3/uL (ref 0.0–0.1)
BASOS PCT: 1 %
Basophils Absolute: 0.2 10*3/uL — ABNORMAL HIGH (ref 0.0–0.1)
Basophils Relative: 0 %
EOS ABS: 0.8 10*3/uL — AB (ref 0.0–0.7)
EOS ABS: 0.6 10*3/uL (ref 0.0–0.7)
EOS PCT: 4 %
Eosinophils Relative: 4 %
HCT: 23 % — ABNORMAL LOW (ref 39.0–52.0)
HEMATOCRIT: 26.8 % — AB (ref 39.0–52.0)
HEMOGLOBIN: 8 g/dL — AB (ref 13.0–17.0)
HEMOGLOBIN: 6.7 g/dL — AB (ref 13.0–17.0)
LYMPHS PCT: 16 %
LYMPHS PCT: 18 %
Lymphs Abs: 3.1 10*3/uL (ref 0.7–4.0)
Lymphs Abs: 2.8 10*3/uL (ref 0.7–4.0)
MCH: 19 pg — ABNORMAL LOW (ref 26.0–34.0)
MCH: 18.5 pg — AB (ref 26.0–34.0)
MCHC: 29.9 g/dL — ABNORMAL LOW (ref 30.0–36.0)
MCHC: 29.1 g/dL — ABNORMAL LOW (ref 30.0–36.0)
MCV: 63.5 fL — ABNORMAL LOW (ref 78.0–100.0)
MCV: 63.5 fL — ABNORMAL LOW (ref 78.0–100.0)
MONO ABS: 1.4 10*3/uL — AB (ref 0.1–1.0)
MONOS PCT: 6 %
Monocytes Absolute: 1.2 10*3/uL — ABNORMAL HIGH (ref 0.1–1.0)
Monocytes Relative: 9 %
NEUTROS PCT: 68 %
Neutro Abs: 14.2 10*3/uL — ABNORMAL HIGH (ref 1.7–7.7)
Neutro Abs: 10.4 10*3/uL — ABNORMAL HIGH (ref 1.7–7.7)
Neutrophils Relative %: 74 %
PLATELETS: 680 10*3/uL — AB (ref 150–400)
Platelets: 818 10*3/uL — ABNORMAL HIGH (ref 150–400)
RBC: 4.22 MIL/uL (ref 4.22–5.81)
RBC: 3.62 MIL/uL — AB (ref 4.22–5.81)
RDW: 19.6 % — AB (ref 11.5–15.5)
RDW: 19.5 % — ABNORMAL HIGH (ref 11.5–15.5)
WBC: 19.3 10*3/uL — ABNORMAL HIGH (ref 4.0–10.5)
WBC: 15.4 10*3/uL — AB (ref 4.0–10.5)

## 2017-05-15 LAB — I-STAT CG4 LACTIC ACID, ED: LACTIC ACID, VENOUS: 1.35 mmol/L (ref 0.5–1.9)

## 2017-05-15 LAB — COMPREHENSIVE METABOLIC PANEL
ALBUMIN: 3.1 g/dL — AB (ref 3.5–5.0)
ALT: 14 U/L — ABNORMAL LOW (ref 17–63)
ANION GAP: 10 (ref 5–15)
AST: 17 U/L (ref 15–41)
Alkaline Phosphatase: 141 U/L — ABNORMAL HIGH (ref 38–126)
BILIRUBIN TOTAL: 0.6 mg/dL (ref 0.3–1.2)
BUN: 18 mg/dL (ref 6–20)
CALCIUM: 9.3 mg/dL (ref 8.9–10.3)
CO2: 25 mmol/L (ref 22–32)
Chloride: 104 mmol/L (ref 101–111)
Creatinine, Ser: 0.9 mg/dL (ref 0.61–1.24)
Glucose, Bld: 198 mg/dL — ABNORMAL HIGH (ref 65–99)
POTASSIUM: 4.3 mmol/L (ref 3.5–5.1)
Sodium: 139 mmol/L (ref 135–145)
TOTAL PROTEIN: 9.7 g/dL — AB (ref 6.5–8.1)

## 2017-05-15 LAB — MAGNESIUM: MAGNESIUM: 2 mg/dL (ref 1.7–2.4)

## 2017-05-15 MED ORDER — OXYCODONE HCL 5 MG PO TABS
5.0000 mg | ORAL_TABLET | Freq: Three times a day (TID) | ORAL | Status: DC | PRN
  Administered 2017-05-16 – 2017-05-19 (×5): 5 mg via ORAL
  Filled 2017-05-15 (×6): qty 1

## 2017-05-15 MED ORDER — OXYCODONE-ACETAMINOPHEN 5-325 MG PO TABS
1.0000 | ORAL_TABLET | Freq: Three times a day (TID) | ORAL | Status: DC | PRN
  Administered 2017-05-16 – 2017-05-20 (×5): 1 via ORAL
  Filled 2017-05-15 (×6): qty 1

## 2017-05-15 MED ORDER — ONDANSETRON HCL 4 MG PO TABS: 4.0000 mg | ORAL_TABLET | Freq: Four times a day (QID) | ORAL | Status: DC | PRN

## 2017-05-15 MED ORDER — ZINC SULFATE 220 (50 ZN) MG PO CAPS
220.0000 mg | ORAL_CAPSULE | Freq: Every day | ORAL | Status: DC
  Administered 2017-05-16 – 2017-05-21 (×6): 220 mg via ORAL
  Filled 2017-05-15 (×7): qty 1

## 2017-05-15 MED ORDER — SODIUM CHLORIDE 0.9 % IV BOLUS (SEPSIS)
1000.0000 mL | Freq: Once | INTRAVENOUS | Status: AC
  Administered 2017-05-15: 1000 mL via INTRAVENOUS

## 2017-05-15 MED ORDER — SODIUM CHLORIDE 0.9% FLUSH
3.0000 mL | Freq: Two times a day (BID) | INTRAVENOUS | Status: DC
  Administered 2017-05-16 – 2017-05-20 (×5): 3 mL via INTRAVENOUS

## 2017-05-15 MED ORDER — IOPAMIDOL (ISOVUE-300) INJECTION 61%
INTRAVENOUS | Status: AC
  Filled 2017-05-15: qty 100

## 2017-05-15 MED ORDER — POLYETHYLENE GLYCOL 3350 17 G PO PACK: 17.0000 g | PACK | Freq: Every day | ORAL | Status: DC | PRN

## 2017-05-15 MED ORDER — IBUPROFEN 800 MG PO TABS: 800.0000 mg | ORAL_TABLET | Freq: Three times a day (TID) | ORAL | Status: DC | PRN

## 2017-05-15 MED ORDER — PREGABALIN 75 MG PO CAPS
300.0000 mg | ORAL_CAPSULE | Freq: Two times a day (BID) | ORAL | Status: DC
  Administered 2017-05-16 – 2017-05-21 (×12): 300 mg via ORAL
  Filled 2017-05-15 (×12): qty 4

## 2017-05-15 MED ORDER — ENOXAPARIN SODIUM 40 MG/0.4ML ~~LOC~~ SOLN
40.0000 mg | SUBCUTANEOUS | Status: DC
  Administered 2017-05-16 – 2017-05-21 (×6): 40 mg via SUBCUTANEOUS
  Filled 2017-05-15 (×6): qty 0.4

## 2017-05-15 MED ORDER — IOPAMIDOL (ISOVUE-300) INJECTION 61%
100.0000 mL | Freq: Once | INTRAVENOUS | Status: AC | PRN
  Administered 2017-05-15: 100 mL via INTRAVENOUS

## 2017-05-15 MED ORDER — ONDANSETRON HCL 4 MG/2ML IJ SOLN: 4.0000 mg | Freq: Four times a day (QID) | INTRAMUSCULAR | Status: DC | PRN

## 2017-05-15 MED ORDER — OXYCODONE-ACETAMINOPHEN 10-325 MG PO TABS: 1.0000 | ORAL_TABLET | Freq: Three times a day (TID) | ORAL | Status: DC | PRN

## 2017-05-15 MED ORDER — ACETAMINOPHEN 650 MG RE SUPP
650.0000 mg | Freq: Four times a day (QID) | RECTAL | Status: DC | PRN
Start: 2017-05-15 — End: 2017-05-20

## 2017-05-15 MED ORDER — ACETAMINOPHEN 325 MG PO TABS: 650.0000 mg | ORAL_TABLET | Freq: Four times a day (QID) | ORAL | Status: DC | PRN

## 2017-05-15 MED ORDER — CRANBERRY 1000 MG PO CAPS: 2000.0000 mg | ORAL_CAPSULE | Freq: Two times a day (BID) | ORAL | Status: DC

## 2017-05-15 MED ORDER — HYDROMORPHONE HCL 1 MG/ML IJ SOLN
0.5000 mg | Freq: Once | INTRAMUSCULAR | Status: AC
  Administered 2017-05-15: 0.5 mg via INTRAVENOUS
  Filled 2017-05-15: qty 1

## 2017-05-15 MED ORDER — NITROFURANTOIN MONOHYD MACRO 100 MG PO CAPS
100.0000 mg | ORAL_CAPSULE | Freq: Two times a day (BID) | ORAL | Status: DC
  Filled 2017-05-15: qty 1

## 2017-05-15 MED ORDER — VANCOMYCIN HCL IN DEXTROSE 1-5 GM/200ML-% IV SOLN
1000.0000 mg | Freq: Two times a day (BID) | INTRAVENOUS | Status: DC
  Administered 2017-05-16: 1000 mg via INTRAVENOUS
  Filled 2017-05-15 (×3): qty 200

## 2017-05-15 MED ORDER — LIDOCAINE VISCOUS 2 % MT SOLN
15.0000 mL | Freq: Once | OROMUCOSAL | Status: AC
  Administered 2017-05-15: 15 mL via OROMUCOSAL
  Filled 2017-05-15: qty 15

## 2017-05-15 MED ORDER — LORATADINE 10 MG PO TABS
10.0000 mg | ORAL_TABLET | Freq: Every day | ORAL | Status: DC
Start: 2017-05-16 — End: 2017-05-21
  Administered 2017-05-16 – 2017-05-21 (×6): 10 mg via ORAL
  Filled 2017-05-15 (×6): qty 1

## 2017-05-15 MED ORDER — PIPERACILLIN-TAZOBACTAM 3.375 G IVPB 30 MIN
3.3750 g | Freq: Once | INTRAVENOUS | Status: AC
  Administered 2017-05-15: 3.375 g via INTRAVENOUS
  Filled 2017-05-15: qty 50

## 2017-05-15 MED ORDER — PANTOPRAZOLE SODIUM 40 MG PO TBEC
40.0000 mg | DELAYED_RELEASE_TABLET | Freq: Every day | ORAL | Status: DC
  Administered 2017-05-16 – 2017-05-21 (×5): 40 mg via ORAL
  Filled 2017-05-15 (×6): qty 1

## 2017-05-15 MED ORDER — BACLOFEN 20 MG PO TABS
40.0000 mg | ORAL_TABLET | Freq: Two times a day (BID) | ORAL | Status: DC
  Administered 2017-05-16 – 2017-05-21 (×12): 40 mg via ORAL
  Filled 2017-05-15 (×12): qty 2

## 2017-05-15 MED ORDER — VANCOMYCIN HCL IN DEXTROSE 1-5 GM/200ML-% IV SOLN
1000.0000 mg | Freq: Once | INTRAVENOUS | Status: AC
  Administered 2017-05-15: 1000 mg via INTRAVENOUS
  Filled 2017-05-15: qty 200

## 2017-05-15 MED ORDER — FOLIC ACID 1 MG PO TABS
1.0000 mg | ORAL_TABLET | Freq: Every day | ORAL | Status: DC
  Administered 2017-05-16 – 2017-05-21 (×5): 1 mg via ORAL
  Filled 2017-05-15 (×7): qty 1

## 2017-05-15 MED ORDER — DULOXETINE HCL 60 MG PO CPEP
60.0000 mg | ORAL_CAPSULE | Freq: Two times a day (BID) | ORAL | Status: DC
  Administered 2017-05-16 – 2017-05-21 (×12): 60 mg via ORAL
  Filled 2017-05-15 (×12): qty 1

## 2017-05-15 MED ORDER — OXYBUTYNIN CHLORIDE 5 MG PO TABS
5.0000 mg | ORAL_TABLET | Freq: Three times a day (TID) | ORAL | Status: DC
  Administered 2017-05-16 – 2017-05-21 (×16): 5 mg via ORAL
  Filled 2017-05-15 (×17): qty 1

## 2017-05-15 MED ORDER — OXYCODONE HCL ER 15 MG PO T12A
30.0000 mg | EXTENDED_RELEASE_TABLET | Freq: Two times a day (BID) | ORAL | Status: DC
  Administered 2017-05-16 – 2017-05-21 (×12): 30 mg via ORAL
  Filled 2017-05-15 (×12): qty 2

## 2017-05-15 MED ORDER — PIPERACILLIN-TAZOBACTAM 3.375 G IVPB
3.3750 g | Freq: Three times a day (TID) | INTRAVENOUS | Status: DC
  Administered 2017-05-16 (×2): 3.375 g via INTRAVENOUS
  Filled 2017-05-15 (×4): qty 50
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
## 2017-05-15 NOTE — ED Triage Notes (Signed)
Patient states that he had infection in hip couple years ago and thinks he may have one again. When asked what symptoms he has to make him think that he reports being tired, weak all the time.  Patient also reports that he recently diagnosed for UTI and still currently taking them.

## 2017-05-15 NOTE — ED Notes (Signed)
Security to transport pt wheel chair
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
## 2017-05-15 NOTE — Progress Notes (Signed)
Pharmacy Antibiotic Note  Sean Horton is a 31 y.o. male admitted on 05/15/2017 with cellulitis.  CT concerning for abscess/gas extending to the bone as well as chronic osteomyelitis at coccyx and femoral head.   He is paraplegic with chronic indwelling foley catheter currently on course of Macrobid for MSSA UTI and hx osteomyelitis of left hip.   He is currently afebrile with noted leukocytosis.  Estimated CrCl >172ml/min, however likely over-estimated since patient is paraplegic. In 2016 he had supra-therapeutic level on Vanc 1gm q8h dosing with similar Scr so will dose moe conservatively that population kinetics would suggest. Pharmacy has been consulted for Vancomycin & Zosyn dosing.  Plan: Zosyn 3.375gm IV Q8h to be infused over 4hrs Vancomycin 1gm IV q12h (In 2016 he had supra-therapeutic level on Vanc 1gm q8h dosing with similar Scr so will dose more conservatively that population kinetics would suggest.) Check Vancomycin trough at steady state Monitor renal function and cx data      Temp (24hrs), Avg:98.7 F (37.1 C), Min:98.7 F (37.1 C), Max:98.7 F (37.1 C)   Recent Labs Lab 05/15/17 1430 05/15/17 1652  WBC 19.3* 15.4*  CREATININE 0.90  --     CrCl cannot be calculated (Unknown ideal weight.).    No Known Allergies  Antimicrobials this admission: 6/19 Vanc >>  6/19 Zosyn >>   Dose adjustments this admission:  Microbiology results: 6/19 BCx: ordered 6/13 UCx: +MSSA  Thank you for allowing pharmacy to be a part of this patient's care.  Biagio Borg 05/15/2017 5:39 PM

## 2017-05-15 NOTE — ED Provider Notes (Addendum)
Patient complains of generalized weakness and pain in his left hip, for several days complains of mild sore throat at left side of throat for several days. He has also had subjective fever area patient is alert and nontoxic. Oropharynx appears normal. He is handling secretions well. He does not appear uncomfortable. Left lower extremity there is a large open wound to the hip with minimal serosanguineous drainage. In light of CT scan, leukocytosis, code sepsis called. I've spoken with Dr.Diallo from family medicine service at [facility] who accepts patient in transfer in conjunction with his attending Dr. Mingo Amber. Results for orders placed or performed during the hospital encounter of 05/15/17  Comprehensive metabolic panel  Result Value Ref Range   Sodium 139 135 - 145 mmol/L   Potassium 4.3 3.5 - 5.1 mmol/L   Chloride 104 101 - 111 mmol/L   CO2 25 22 - 32 mmol/L   Glucose, Bld 198 (H) 65 - 99 mg/dL   BUN 18 6 - 20 mg/dL   Creatinine, Ser 0.90 0.61 - 1.24 mg/dL   Calcium 9.3 8.9 - 10.3 mg/dL   Total Protein 9.7 (H) 6.5 - 8.1 g/dL   Albumin 3.1 (L) 3.5 - 5.0 g/dL   AST 17 15 - 41 U/L   ALT 14 (L) 17 - 63 U/L   Alkaline Phosphatase 141 (H) 38 - 126 U/L   Total Bilirubin 0.6 0.3 - 1.2 mg/dL   GFR calc non Af Amer >60 >60 mL/min   GFR calc Af Amer >60 >60 mL/min   Anion gap 10 5 - 15  CBC with Differential  Result Value Ref Range   WBC 19.3 (H) 4.0 - 10.5 K/uL   RBC 4.22 4.22 - 5.81 MIL/uL   Hemoglobin 8.0 (L) 13.0 - 17.0 g/dL   HCT 26.8 (L) 39.0 - 52.0 %   MCV 63.5 (L) 78.0 - 100.0 fL   MCH 19.0 (L) 26.0 - 34.0 pg   MCHC 29.9 (L) 30.0 - 36.0 g/dL   RDW 19.6 (H) 11.5 - 15.5 %   Platelets 818 (H) 150 - 400 K/uL   Neutrophils Relative % 74 %   Lymphocytes Relative 16 %   Monocytes Relative 6 %   Eosinophils Relative 4 %   Basophils Relative 0 %   Neutro Abs 14.2 (H) 1.7 - 7.7 K/uL   Lymphs Abs 3.1 0.7 - 4.0 K/uL   Monocytes Absolute 1.2 (H) 0.1 - 1.0 K/uL   Eosinophils  Absolute 0.8 (H) 0.0 - 0.7 K/uL   Basophils Absolute 0.0 0.0 - 0.1 K/uL   RBC Morphology POLYCHROMASIA PRESENT   CBC with Differential  Result Value Ref Range   WBC 15.4 (H) 4.0 - 10.5 K/uL   RBC 3.62 (L) 4.22 - 5.81 MIL/uL   Hemoglobin 6.7 (LL) 13.0 - 17.0 g/dL   HCT 23.0 (L) 39.0 - 52.0 %   MCV 63.5 (L) 78.0 - 100.0 fL   MCH 18.5 (L) 26.0 - 34.0 pg   MCHC 29.1 (L) 30.0 - 36.0 g/dL   RDW 19.5 (H) 11.5 - 15.5 %   Platelets 680 (H) 150 - 400 K/uL   Neutrophils Relative % 68 %   Lymphocytes Relative 18 %   Monocytes Relative 9 %   Eosinophils Relative 4 %   Basophils Relative 1 %   Neutro Abs 10.4 (H) 1.7 - 7.7 K/uL   Lymphs Abs 2.8 0.7 - 4.0 K/uL   Monocytes Absolute 1.4 (H) 0.1 - 1.0 K/uL   Eosinophils  Absolute 0.6 0.0 - 0.7 K/uL   Basophils Absolute 0.2 (H) 0.0 - 0.1 K/uL   RBC Morphology POLYCHROMASIA PRESENT   I-Stat CG4 Lactic Acid, ED  (not at  [facility])  Result Value Ref Range   Lactic Acid, Venous 1.35 0.5 - 1.9 mmol/L   Ct Pelvis W Contrast  Result Date: 05/15/2017 CLINICAL DATA:  Malodorous left hip in a patient with a history of remote left hip infection. EXAM: CT PELVIS WITH CONTRAST TECHNIQUE: Multidetector CT imaging of the pelvis was performed using the standard protocol following the bolus administration of intravenous contrast. CONTRAST:  100 ml ISOVUE-300 IOPAMIDOL (ISOVUE-300) INJECTION 61% COMPARISON:  Single-view of the pelvis 07/29/2011 and plain films left hip 07/18/2015. FINDINGS: The left femoral head is not visualized consistent with either osteolysis due to infection or resection. There is extensive heterotopic ossification about the left hip. Multiple small bone fragments are identified about the joint. The left proximal femur in trochanter are markedly thickened and sclerotic. Lucency in the greater trochanter appears unchanged since the comparison plain films. The appearance is not grossly changed compared to the prior plain films. There is a large decubitus  ulcer at approximately the level of the left greater trochanter with gas extending to the surface of bone along the posterior cortex of the greater trochanter. No finding to suggest acute bony destructive change is identified but gas extending to bone is worrisome for osteomyelitis. Marked soft tissue thickening is present about the left hip and at least partially consistent with the presence of granulation tissue. Deep to the posterior aspect of the patient's skin ulceration, there is a fluid collection measuring 3.0 cm transverse x 2.2 cm AP x 3.0 cm craniocaudal most consistent with abscess. This collection is contiguous with bone. There is bone-on-bone right hip joint space narrowing. Small amount of heterotopic ossification off the right greater trochanter is noted. The sacroiliac joints and symphysis pubis appear normal. There is soft tissue thickening posterior to the coccyx. The coccyx is diminutive and sclerotic consistent with chronic or remote osteomyelitis. No focal fluid collection is identified with thickening of subcutaneous tissues over the right ischial tuberosity identified without underlying bony changes seen. The bladder is decompressed with a catheter in place. No fluid collection within the pelvis is identified. Imaged bowel loops are unremarkable. IMPRESSION: Large ulceration over the left hip with gas extending to the posterior aspect of the remnant of the greater trochanter. No CT signs of acute osteomyelitis are identified but gas extending to bone is worrisome for infection. Marked thickening of soft tissues about the left hip is compatible granulation tissue. Low attenuation collection deep to the posterior aspect of the ulceration is worrisome for abscess and contiguous with bone. Chronic osteolysis versus resection of the right femoral head. There is extensive heterotopic ossification about the left hip with changes consistent with chronic/remote osteomyelitis. Findings consistent with  chronic/remote osteomyelitis of the coccyx. Bone-on-bone right hip joint space narrowing. Heterotopic calcification off the right greater trochanter may be due to old trauma or infection. Soft tissue thickening over the right ischial tuberosity is consistent with pressure change. No abscess or osteomyelitis. Electronically Signed   By: Inge Rise M.D.   On: 05/15/2017 16:32   Dg Hip Unilat With Pelvis 2-3 Views Left  Result Date: 05/10/2017 CLINICAL DATA:  Nonhealing ulcer of the left trochanter. EXAM: DG HIP (WITH OR WITHOUT PELVIS) 2-3V LEFT COMPARISON:  None. FINDINGS: Soft tissue ulcer over the left greater trochanter. Developmental dysplasia of the left hip  with chronic left hip dislocation an osteolysis of the femoral head. Heterotopic ossification adjacent to the femoral neck. No periosteal reaction or bone destruction. Moderate osteoarthritis of the right hip. Mild heterotopic ossification adjacent to the right greater trochanter. No aggressive lytic or sclerotic osseous lesion. IMPRESSION: 1. Soft tissue ulcer overlying the left greater trochanter. No evidence of osteomyelitis. Electronically Signed   By: Kathreen Devoid   On: 05/10/2017 08:09    7:10 PM complains of "neuropathic pain." Intravenous hydromorphone ordered. Patient is stable for transfer to [facility]   Orlie Dakin, MD 05/15/17 2016    Orlie Dakin, MD 05/16/17 [phone]

## 2017-05-16 ENCOUNTER — Inpatient Hospital Stay (HOSPITAL_COMMUNITY): Payer: Medicaid Other | Admitting: Anesthesiology

## 2017-05-16 ENCOUNTER — Encounter (HOSPITAL_COMMUNITY)
Admission: EM | Disposition: A | Discharge status: Home Health | Payer: Self-pay | Source: Home / Self Care | Attending: Family Medicine

## 2017-05-16 ENCOUNTER — Encounter (HOSPITAL_COMMUNITY): Payer: Self-pay | Admitting: *Deleted

## 2017-05-16 DIAGNOSIS — G8220 Paraplegia, unspecified: Secondary | ICD-10-CM

## 2017-05-16 DIAGNOSIS — L089 Local infection of the skin and subcutaneous tissue, unspecified: Secondary | ICD-10-CM

## 2017-05-16 DIAGNOSIS — L02416 Cutaneous abscess of left lower limb: Secondary | ICD-10-CM

## 2017-05-16 DIAGNOSIS — N3000 Acute cystitis without hematuria: Secondary | ICD-10-CM

## 2017-05-16 DIAGNOSIS — T148XXA Other injury of unspecified body region, initial encounter: Secondary | ICD-10-CM

## 2017-05-16 HISTORY — PX: I&D EXTREMITY: SHX5045

## 2017-05-16 HISTORY — PX: APPLICATION OF WOUND VAC: SHX5189

## 2017-05-16 LAB — URINALYSIS, ROUTINE W REFLEX MICROSCOPIC
Bilirubin Urine: NEGATIVE
GLUCOSE, UA: NEGATIVE mg/dL
HGB URINE DIPSTICK: NEGATIVE
Ketones, ur: NEGATIVE mg/dL
NITRITE: NEGATIVE
PH: 5 (ref 5.0–8.0)
PROTEIN: 30 mg/dL — AB
SPECIFIC GRAVITY, URINE: 1.029 (ref 1.005–1.030)

## 2017-05-16 LAB — BASIC METABOLIC PANEL
ANION GAP: 6 (ref 5–15)
BUN: 13 mg/dL (ref 6–20)
CHLORIDE: 108 mmol/L (ref 101–111)
CO2: 24 mmol/L (ref 22–32)
Calcium: 9 mg/dL (ref 8.9–10.3)
Creatinine, Ser: 0.84 mg/dL (ref 0.61–1.24)
GFR calc Af Amer: >60 mL/min (ref >60)
GLUCOSE: 100 mg/dL — AB (ref 65–99)
POTASSIUM: 4 mmol/L (ref 3.5–5.1)
Sodium: 138 mmol/L (ref 135–145)

## 2017-05-16 LAB — ABO/RH: ABO/RH(D): O POS

## 2017-05-16 LAB — FERRITIN: FERRITIN: 94 ng/mL (ref 24–336)

## 2017-05-16 LAB — RETICULOCYTES
RBC.: 3.52 MIL/uL — AB (ref 4.22–5.81)
Retic Count, Absolute: 98.6 10*3/uL (ref 19.0–186.0)
Retic Ct Pct: 2.8 % (ref 0.4–3.1)

## 2017-05-16 LAB — HIV ANTIBODY (ROUTINE TESTING W REFLEX): HIV SCREEN 4TH GENERATION: NONREACTIVE

## 2017-05-16 LAB — CBC
HEMATOCRIT: 22.8 % — AB (ref 39.0–52.0)
HEMOGLOBIN: 6.3 g/dL — AB (ref 13.0–17.0)
MCH: 17.9 pg — ABNORMAL LOW (ref 26.0–34.0)
MCHC: 27.6 g/dL — AB (ref 30.0–36.0)
MCV: 64.8 fL — AB (ref 78.0–100.0)
Platelets: 675 10*3/uL — ABNORMAL HIGH (ref 150–400)
RBC: 3.52 MIL/uL — ABNORMAL LOW (ref 4.22–5.81)
RDW: 20.1 % — ABNORMAL HIGH (ref 11.5–15.5)
WBC: 12.9 10*3/uL — ABNORMAL HIGH (ref 4.0–10.5)

## 2017-05-16 LAB — IRON AND TIBC
Iron: 14 ug/dL — ABNORMAL LOW (ref 45–182)
SATURATION RATIOS: 6 % — AB (ref 17.9–39.5)
TIBC: 252 ug/dL (ref 250–450)
UIBC: 238 ug/dL

## 2017-05-16 LAB — VITAMIN B12: Vitamin B-12: 136 pg/mL — ABNORMAL LOW (ref 180–914)

## 2017-05-16 LAB — POCT I-STAT 4, (NA,K, GLUC, HGB,HCT)
Glucose, Bld: 107 mg/dL — ABNORMAL HIGH (ref 65–99)
HCT: 29 % — ABNORMAL LOW (ref 39.0–52.0)
HEMOGLOBIN: 9.9 g/dL — AB (ref 13.0–17.0)
Potassium: 4.4 mmol/L (ref 3.5–5.1)
Sodium: 143 mmol/L (ref 135–145)

## 2017-05-16 LAB — PREPARE RBC (CROSSMATCH)

## 2017-05-16 LAB — SURGICAL PCR SCREEN
MRSA, PCR: NEGATIVE
Staphylococcus aureus: POSITIVE — AB

## 2017-05-16 LAB — FOLATE: FOLATE: 17.8 ng/mL (ref >5.9)

## 2017-05-16 SURGERY — IRRIGATION AND DEBRIDEMENT EXTREMITY
Anesthesia: General | Site: Hip | Laterality: Left

## 2017-05-16 MED ORDER — FENTANYL CITRATE (PF) 250 MCG/5ML IJ SOLN
INTRAMUSCULAR | Status: DC | PRN
  Administered 2017-05-16: 100 ug via INTRAVENOUS

## 2017-05-16 MED ORDER — LIDOCAINE 2% (20 MG/ML) 5 ML SYRINGE
INTRAMUSCULAR | Status: AC
  Filled 2017-05-16: qty 5

## 2017-05-16 MED ORDER — PROPOFOL 10 MG/ML IV BOLUS
INTRAVENOUS | Status: AC
  Filled 2017-05-16: qty 20

## 2017-05-16 MED ORDER — HYDROMORPHONE HCL 1 MG/ML IJ SOLN
0.5000 mg | INTRAMUSCULAR | Status: DC | PRN
  Administered 2017-05-16 – 2017-05-17 (×4): 0.5 mg via INTRAVENOUS
  Filled 2017-05-16 (×4): qty 1

## 2017-05-16 MED ORDER — PROMETHAZINE HCL 25 MG/ML IJ SOLN: 6.2500 mg | INTRAMUSCULAR | Status: DC | PRN

## 2017-05-16 MED ORDER — FENTANYL CITRATE (PF) 250 MCG/5ML IJ SOLN
INTRAMUSCULAR | Status: AC
  Filled 2017-05-16: qty 5

## 2017-05-16 MED ORDER — LACTATED RINGERS IV SOLN
INTRAVENOUS | Status: DC
  Administered 2017-05-16: 16:00:00 via INTRAVENOUS
  Administered 2017-05-17: 10 mL/h via INTRAVENOUS
  Administered 2017-05-20: 10:00:00 via INTRAVENOUS

## 2017-05-16 MED ORDER — SODIUM CHLORIDE 0.9 % IV SOLN
Freq: Once | INTRAVENOUS | Status: AC
  Administered 2017-05-16: 10 mL via INTRAVENOUS

## 2017-05-16 MED ORDER — SODIUM CHLORIDE 0.9 % IR SOLN
Status: DC | PRN
  Administered 2017-05-16: 6000 mL

## 2017-05-16 MED ORDER — MIDAZOLAM HCL 2 MG/2ML IJ SOLN
INTRAMUSCULAR | Status: AC
  Filled 2017-05-16: qty 2

## 2017-05-16 MED ORDER — LIDOCAINE 2% (20 MG/ML) 5 ML SYRINGE
INTRAMUSCULAR | Status: DC | PRN
Start: 2017-05-16 — End: 2017-05-16
  Administered 2017-05-16: 25 mg via INTRAVENOUS

## 2017-05-16 MED ORDER — PHENYLEPHRINE 40 MCG/ML (10ML) SYRINGE FOR IV PUSH (FOR BLOOD PRESSURE SUPPORT)
PREFILLED_SYRINGE | INTRAVENOUS | Status: AC
  Filled 2017-05-16: qty 10

## 2017-05-16 MED ORDER — FENTANYL CITRATE (PF) 100 MCG/2ML IJ SOLN
25.0000 ug | INTRAMUSCULAR | Status: DC | PRN
  Administered 2017-05-16: 50 ug via INTRAVENOUS

## 2017-05-16 MED ORDER — FENTANYL CITRATE (PF) 100 MCG/2ML IJ SOLN
INTRAMUSCULAR | Status: AC
  Filled 2017-05-16: qty 2

## 2017-05-16 MED ORDER — 0.9 % SODIUM CHLORIDE (POUR BTL) OPTIME
TOPICAL | Status: DC | PRN
  Administered 2017-05-16: 1000 mL

## 2017-05-16 MED ORDER — PIPERACILLIN-TAZOBACTAM 3.375 G IVPB
3.3750 g | Freq: Three times a day (TID) | INTRAVENOUS | Status: DC
  Administered 2017-05-16 – 2017-05-21 (×14): 3.375 g via INTRAVENOUS
  Filled 2017-05-16 (×15): qty 50

## 2017-05-16 MED ORDER — EPHEDRINE 5 MG/ML INJ
INTRAVENOUS | Status: AC
  Filled 2017-05-16: qty 10

## 2017-05-16 MED ORDER — MIDAZOLAM HCL 5 MG/5ML IJ SOLN
INTRAMUSCULAR | Status: DC | PRN
  Administered 2017-05-16: 2 mg via INTRAVENOUS

## 2017-05-16 MED ORDER — ONDANSETRON HCL 4 MG/2ML IJ SOLN
INTRAMUSCULAR | Status: DC | PRN
  Administered 2017-05-16: 4 mg via INTRAVENOUS

## 2017-05-16 MED ORDER — ONDANSETRON HCL 4 MG/2ML IJ SOLN
INTRAMUSCULAR | Status: AC
  Filled 2017-05-16: qty 2

## 2017-05-16 MED ORDER — PROPOFOL 10 MG/ML IV BOLUS
INTRAVENOUS | Status: DC | PRN
  Administered 2017-05-16: 200 mg via INTRAVENOUS

## 2017-05-16 SURGICAL SUPPLY — 51 items
BANDAGE ACE 3X5.8 VEL STRL LF (GAUZE/BANDAGES/DRESSINGS) IMPLANT
BLADE SURG 10 STRL SS (BLADE) ×4 IMPLANT
BNDG COHESIVE 4X5 TAN STRL (GAUZE/BANDAGES/DRESSINGS) IMPLANT
BNDG COHESIVE 6X5 TAN STRL LF (GAUZE/BANDAGES/DRESSINGS) IMPLANT
CANISTER WOUND CARE 500ML ATS (WOUND CARE) ×8 IMPLANT
COLLECTOR WOUND DRAINAGE MED (WOUND CARE) ×4 IMPLANT
COVER SURGICAL LIGHT HANDLE (MISCELLANEOUS) ×4 IMPLANT
CUFF TOURNIQUET SINGLE 18IN (TOURNIQUET CUFF) IMPLANT
CUFF TOURNIQUET SINGLE 34IN LL (TOURNIQUET CUFF) IMPLANT
DRAPE IMP U-DRAPE 54X76 (DRAPES) ×4 IMPLANT
DRAPE INCISE IOBAN 66X45 STRL (DRAPES) ×8 IMPLANT
DRAPE ORTHO SPLIT 77X108 STRL (DRAPES) ×4
DRAPE SURG 17X23 STRL (DRAPES) ×4 IMPLANT
DRAPE SURG ORHT 6 SPLT 77X108 (DRAPES) ×4 IMPLANT
DRAPE U-SHAPE 47X51 STRL (DRAPES) ×4 IMPLANT
DRSG VAC ATS MED SENSATRAC (GAUZE/BANDAGES/DRESSINGS) ×8 IMPLANT
DURAPREP 26ML APPLICATOR (WOUND CARE) IMPLANT
ELECT REM PT RETURN 9FT ADLT (ELECTROSURGICAL)
ELECTRODE REM PT RTRN 9FT ADLT (ELECTROSURGICAL) IMPLANT
GAUZE SPONGE 4X4 12PLY STRL (GAUZE/BANDAGES/DRESSINGS) IMPLANT
GAUZE XEROFORM 1X8 LF (GAUZE/BANDAGES/DRESSINGS) IMPLANT
GLOVE BIO SURGEON STRL SZ8 (GLOVE) ×4 IMPLANT
GLOVE BIOGEL PI IND STRL 8 (GLOVE) ×4 IMPLANT
GLOVE BIOGEL PI INDICATOR 8 (GLOVE) ×4
GLOVE ORTHO TXT STRL SZ7.5 (GLOVE) ×4 IMPLANT
GOWN STRL REUS W/ TWL LRG LVL3 (GOWN DISPOSABLE) ×2 IMPLANT
GOWN STRL REUS W/ TWL XL LVL3 (GOWN DISPOSABLE) ×4 IMPLANT
GOWN STRL REUS W/TWL LRG LVL3 (GOWN DISPOSABLE) ×2
GOWN STRL REUS W/TWL XL LVL3 (GOWN DISPOSABLE) ×4
HANDPIECE INTERPULSE COAX TIP (DISPOSABLE) ×2
KIT BASIN OR (CUSTOM PROCEDURE TRAY) ×4 IMPLANT
KIT ROOM TURNOVER OR (KITS) ×4 IMPLANT
MANIFOLD NEPTUNE II (INSTRUMENTS) ×4 IMPLANT
NS IRRIG 1000ML POUR BTL (IV SOLUTION) ×4 IMPLANT
PACK ORTHO EXTREMITY (CUSTOM PROCEDURE TRAY) ×4 IMPLANT
PAD ARMBOARD 7.5X6 YLW CONV (MISCELLANEOUS) ×12 IMPLANT
PADDING CAST ABS 4INX4YD NS (CAST SUPPLIES)
PADDING CAST ABS COTTON 4X4 ST (CAST SUPPLIES) IMPLANT
PADDING CAST COTTON 6X4 STRL (CAST SUPPLIES) IMPLANT
SET HNDPC FAN SPRY TIP SCT (DISPOSABLE) ×2 IMPLANT
SPONGE LAP 18X18 X RAY DECT (DISPOSABLE) ×4 IMPLANT
STOCKINETTE IMPERVIOUS 9X36 MD (GAUZE/BANDAGES/DRESSINGS) IMPLANT
SUT ETHILON 2 0 FS 18 (SUTURE) IMPLANT
SUT ETHILON 3 0 PS 1 (SUTURE) IMPLANT
SWAB CULTURE ESWAB REG 1ML (MISCELLANEOUS) IMPLANT
TOWEL OR 17X24 6PK STRL BLUE (TOWEL DISPOSABLE) ×4 IMPLANT
TOWEL OR 17X26 10 PK STRL BLUE (TOWEL DISPOSABLE) ×4 IMPLANT
TUBE CONNECTING 12'X1/4 (SUCTIONS) ×1
TUBE CONNECTING 12X1/4 (SUCTIONS) ×3 IMPLANT
UNDERPAD 30X30 (UNDERPADS AND DIAPERS) ×4 IMPLANT
YANKAUER SUCT BULB TIP NO VENT (SUCTIONS) ×4 IMPLANT

## 2017-05-16 NOTE — Care Management Note (Addendum)
Case Management Note  Patient Details  Name: DEVELL PARKERSON MRN: [id number] Date of Birth: 1986-08-01  Subjective/Objective:                    Action/Plan:  Discussed discharge planning with patient. Confirmed face sheet information . Patient lives alone.Already has wheel chair, and shower stool. Patient has regular bed with a gel overlay at home. Patient does NOT want hospital bed. Patient has transportation home.   Patient has assistance at home for wound care and possible IV ABX.  Choice offered , he wants [facility]. Will continue to follow. Expected Discharge Date:                  Expected Discharge Plan:  [place]  In-House Referral:     Discharge planning Services  CM Consult  Post Acute Care Choice:  Home Health Choice offered to:  Patient  DME Arranged:    DME Agency:     HH Arranged:    [place] Agency:     Status of Service:  In process, will continue to follow  If discussed at Long Length of Stay Meetings, dates discussed:    Additional Comments:  Marilu Favre, RN 05/16/2017, 12:03 PM
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
## 2017-05-16 NOTE — Progress Notes (Signed)
PT Note:  Spoke with pt about PLOF and OT worked with pt. Pt's functional status has remained unchanged with current issues. Pt independent with transfers and bed mobility. PT signing off. Thank you for referral.  Leighton Roach, [place]  [phone]

## 2017-05-16 NOTE — Transfer of Care (Signed)
Immediate Anesthesia Transfer of Care Note  Patient: Sean Horton  Procedure(s) Performed: Procedure(s): IRRIGATION AND DEBRIDEMENT WOUND LEFT HIP (Left) APPLICATION OF WOUND VAC (Left)  Patient Location: PACU  Anesthesia Type:General  Level of Consciousness: awake, alert , oriented and patient cooperative  Airway & Oxygen Therapy: Patient Spontanous Breathing  Post-op Assessment: Report given to RN and Post -op Vital signs reviewed and stable  Post vital signs: Reviewed and stable  Last Vitals:  Vitals:   05/16/17 1404 05/16/17 1526  BP: 96/61 108/66  Pulse: 78 80  Resp:  16  Temp: 36.9 C 36.7 C    Last Pain:  Vitals:   05/16/17 1621  TempSrc:   PainSc: 2       Patients Stated Pain Goal: 2 (09/27/27 1188)  Complications: No apparent anesthesia complications

## 2017-05-16 NOTE — Progress Notes (Signed)
OT Evaluation - 05/16/2017  PTA, pt was living alone and independent in ADLs and functional mobility at w/c level. Pt currently performing ADLs and functional mobility near baseline. Upon arrival, pt finishing sponge bath at bed level with set up. Pt performed transfer from EOB<>w/c at Mod I level with increased time. Pt performed IADLs by making his bed with some assistance. Recommend pt dc home once medically stable per physician. Answered all pt questions and provided appropriate education. All acute OT needs met and will sign off.     05/16/17 1600  OT Visit Information  Last OT Received On 05/16/17  Assistance Needed +1  History of Present Illness 31 yo M with known paraplegia stemming from [place] over 10 years ago, neurogenic bladder/chronic indwelling foley presenting with several day history of general malaise and not feeling well. Patient also had fevers and chills over the past week. He presented to outpatient clinic several days ago and was found to have UTI. Fevers and chills have resolved since starting on antibiotics. He is also noted increased purulent drainage from chronic left hip wound over the greater trochanter. Due to the fact he was not feeling well the fact that he had increased amount of pus from his chronic wound he came to the emergency department to be evaluated. CT scan in the ED revealed concern for abscess as well as air tracking down to the greater trochanter. Also with sacral decubitus ulcer which is known. Patient was therefore admitted to family practice teaching service for further evaluation.  Precautions  Precautions None  Restrictions  Weight Bearing Restrictions No  Home Living  Family/patient expects to be discharged to: Private residence  Living Arrangements Alone  Available Help at Discharge Family;Available PRN/intermittently  Type of Home Apartment  Home Access Level entry  Home Layout One level  Bathroom Shower/Tub Tub/shower unit  Personal assistant Tub bench  Additional Comments Pt home set up for his level of function  Prior Function  Level of Independence Independent with assistive device(s)  Comments Pt performing ADLs and functional mobility at w/c level and mother assists with grocery shoppign and driving.   Communication  Communication No difficulties  Pain Assessment  Pain Assessment Faces  Faces Pain Scale 2  Pain Location Wound sites  Pain Descriptors / Indicators Discomfort  Pain Intervention(s) Monitored during session  Cognition  Arousal/Alertness Awake/alert  Behavior During Therapy WFL for tasks assessed/performed  Overall Cognitive Status Within Functional Limits for tasks assessed  Upper Extremity Assessment  Upper Extremity Assessment Overall WFL for tasks assessed  Lower Extremity Assessment  Lower Extremity Assessment (Paraplegic at T3)  Cervical / Trunk Assessment  Cervical / Trunk Assessment Other exceptions (SCI )  Cervical / Trunk Exceptions SCI T3 (PTA)  ADL  Overall ADL's  At baseline;Modified independent  General ADL Comments Upon arrival, pt had completed bathing with set up from NT. Pt performing ADLs near baseline with increased time and at Mod I level. Pt performed transfer to/from bed<>w/c.  Pt demonstrated independence to perform bed making.   Bed Mobility  Overal bed mobility Independent  General bed mobility comments Performed bed mobility to/from w/c  Transfers  Overall transfer level Independent  Equipment used Ambulation equipment used (w/c)  General transfer comment Pt performed lateral scoot from EOB<>w/c  Balance  Overall balance assessment No apparent balance deficits (not formally assessed) (pt at w/c level and is independent for sitting balance)  OT - End of Session  Equipment Utilized During  Treatment Other (comment) (Pt's w/c)  Activity Tolerance Patient tolerated treatment well  Patient left in bed;with call bell/phone within reach;with  nursing/sitter in room  Nurse Communication Mobility status  OT Assessment  OT Recommendation/Assessment Patient does not need any further OT services  OT Visit Diagnosis Pain;Other abnormalities of gait and mobility (R26.89)  Pain - Right/Left (Wound sites)  Pain - part of body (Wound sites)  OT Problem List Pain;Decreased safety awareness;Decreased knowledge of use of DME or AE;Decreased activity tolerance  AM-PAC OT "6 Clicks" Daily Activity Outcome Measure  Help from another person eating meals? 4  Help from another person taking care of personal grooming? 3  Help from another person toileting, which includes using toliet, bedpan, or urinal? 4  Help from another person bathing (including washing, rinsing, drying)? 4  Help from another person to put on and taking off regular upper body clothing? 4  Help from another person to put on and taking off regular lower body clothing? 3  6 Click Score 22  ADL G Code Conversion CJ  OT Recommendation  Follow Up Recommendations No OT follow up;Supervision - Intermittent  OT Equipment None recommended by OT  Acute Rehab OT Goals  Patient Stated Goal Return home and PLOF  OT Goal Formulation With patient  Time For Goal Achievement 05/30/17  Potential to Achieve Goals Good  OT Time Calculation  OT Start Time (ACUTE ONLY) 1524  OT Stop Time (ACUTE ONLY) 1550  OT Time Calculation (min) 26 min  OT General Charges  $OT Visit 1 Procedure  OT Evaluation  $OT Eval Low Complexity 1 Procedure  OT Treatments  $Self Care/Home Management  8-22 mins  Written Expression  Dominant Hand Right   [place], OTR/L Acute Rehab Pager: [phone] Office: [phone]

## 2017-05-16 NOTE — Progress Notes (Signed)
Family Medicine Teaching Service Daily Progress Note Intern Pager: [phone]  Patient name: Sean Horton Medical record number: [id number] Date of birth: [birth year] Age: 31 y.o. Gender: male  Primary Care Provider: [name], DO Consultants: orthopedics Code Status: full  Pt Overview and Major Events to Date:  6/19- admitted to FPTS  Assessment and Plan: Sean Horton is a 31 y.o. male presenting with L hip infection. PMH is significant for paraplegia with neurogenic bladder and chronic indwelling foley.  Sepsis likely 2/2 L hip infection- VSS, afebrile. WBC trend 19.3 > 15.4 - vital signs per floor  - monitor on telemetry - continue IV vanc/zosyn (6/19-) - Orthopedics consulted, plan for wash out evening 6/20 - f/u blood cultures - consult to wound care  Recent UTI tx with Macrobid-  - check UA and get urine culture - continue oxybutynin; hold Macrobid as patient on broad spectrum abx   Anemia. On admission Hgb 8.0 dropped to 6.7, this am 6.3. - Monitor CBC closely - transfuse 2 units prior to surgery today, post trans H/H - check anemia panel  Chronic nerve pain 2/2 paraplegia. At home on baclofen, cymbalta, lyrica. Patient has had some worsening of his nerve pain with acute infection but is maxed out on those home medications. Also on oxycodone ER 27mg  q12h and prn percocet 10-325mg  q8prn. - continue home meds - prn dilaudid 0.5 mg q2H for severe, breakthrough pain  GERD At home on omeprazole - continue ppi  FEN/GI: NPO for procedure tonight, protonix Prophylaxis: lovenox  Disposition: pending clinical improvement  Subjective:  Feeling slightly improved, however still weak, having a lot of nerve pain. No fevers or chills.   Objective: Temp:  [97.8 F (36.6 C)-98.8 F (37.1 C)] 97.8 F (36.6 C) (06/20 0547) Pulse Rate:  [70-109] 70 (06/20 0547) Resp:  [14-20] 18 (06/20 0547) BP: (103-149)/(55-83) 104/56 (06/20 0547) SpO2:  [98 %-100 %] 99 %  (06/20 0547) Physical Exam: General: pleasant man sitting up in bed in NAD Cardiovascular: RRR no MRG Respiratory: CTAB no increased WOB Abdomen: soft NTND Skin: chronic wound changes over left axilla  Laboratory:  Recent Labs Lab 05/15/17 1430 05/15/17 1652  WBC 19.3* 15.4*  HGB 8.0* 6.7*  HCT 26.8* 23.0*  PLT 818* 680*    Recent Labs Lab 05/15/17 1430  NA 139  K 4.3  CL 104  CO2 25  BUN 18  CREATININE 0.90  CALCIUM 9.3  PROT 9.7*  BILITOT 0.6  ALKPHOS 141*  ALT 14*  AST 17  GLUCOSE 198*   LA 1.35  Imaging/Diagnostic Tests: Ct Pelvis W Contrast  Result Date: 05/15/2017 CLINICAL DATA:  Malodorous left hip in a patient with a history of remote left hip infection. EXAM: CT PELVIS WITH CONTRAST TECHNIQUE: Multidetector CT imaging of the pelvis was performed using the standard protocol following the bolus administration of intravenous contrast. CONTRAST:  100 ml ISOVUE-300 IOPAMIDOL (ISOVUE-300) INJECTION 61% COMPARISON:  Single-view of the pelvis 07/29/2011 and plain films left hip 07/18/2015. FINDINGS: The left femoral head is not visualized consistent with either osteolysis due to infection or resection. There is extensive heterotopic ossification about the left hip. Multiple small bone fragments are identified about the joint. The left proximal femur in trochanter are markedly thickened and sclerotic. Lucency in the greater trochanter appears unchanged since the comparison plain films. The appearance is not grossly changed compared to the prior plain films. There is a large decubitus ulcer at approximately the level of the left greater trochanter  with gas extending to the surface of bone along the posterior cortex of the greater trochanter. No finding to suggest acute bony destructive change is identified but gas extending to bone is worrisome for osteomyelitis. Marked soft tissue thickening is present about the left hip and at least partially consistent with the presence of  granulation tissue. Deep to the posterior aspect of the patient's skin ulceration, there is a fluid collection measuring 3.0 cm transverse x 2.2 cm AP x 3.0 cm craniocaudal most consistent with abscess. This collection is contiguous with bone. There is bone-on-bone right hip joint space narrowing. Small amount of heterotopic ossification off the right greater trochanter is noted. The sacroiliac joints and symphysis pubis appear normal. There is soft tissue thickening posterior to the coccyx. The coccyx is diminutive and sclerotic consistent with chronic or remote osteomyelitis. No focal fluid collection is identified with thickening of subcutaneous tissues over the right ischial tuberosity identified without underlying bony changes seen. The bladder is decompressed with a catheter in place. No fluid collection within the pelvis is identified. Imaged bowel loops are unremarkable. IMPRESSION: Large ulceration over the left hip with gas extending to the posterior aspect of the remnant of the greater trochanter. No CT signs of acute osteomyelitis are identified but gas extending to bone is worrisome for infection. Marked thickening of soft tissues about the left hip is compatible granulation tissue. Low attenuation collection deep to the posterior aspect of the ulceration is worrisome for abscess and contiguous with bone. Chronic osteolysis versus resection of the right femoral head. There is extensive heterotopic ossification about the left hip with changes consistent with chronic/remote osteomyelitis. Findings consistent with chronic/remote osteomyelitis of the coccyx. Bone-on-bone right hip joint space narrowing. Heterotopic calcification off the right greater trochanter may be due to old trauma or infection. Soft tissue thickening over the right ischial tuberosity is consistent with pressure change. No abscess or osteomyelitis. Electronically Signed   By: Inge Rise M.D.   On: 05/15/2017 16:32   [name], DO 05/16/2017, 8:07 AM PGY-1, [place] Intern pager: [phone], text pages welcome

## 2017-05-16 NOTE — Anesthesia Preprocedure Evaluation (Addendum)
Anesthesia Evaluation  Patient identified by MRN, date of birth, ID band Patient awake    Reviewed: Allergy & Precautions, NPO status , Patient's Chart, lab work & pertinent test results  Airway Mallampati: II  TM Distance: >3 FB Neck ROM: Full    Dental  (+) Dental Advisory Given   Pulmonary Current Smoker,    breath sounds clear to auscultation       Cardiovascular negative cardio ROS   Rhythm:Regular Rate:Normal     Neuro/Psych paraplegic  Neuromuscular disease    GI/Hepatic Neg liver ROS, GERD  ,  Endo/Other  negative endocrine ROS  Renal/GU negative Renal ROS     Musculoskeletal   Abdominal   Peds  Hematology  (+) anemia ,   Anesthesia Other Findings   Reproductive/Obstetrics                             Lab Results  Component Value Date   WBC 12.9 (H) 05/16/2017   HGB 9.9 (L) 05/16/2017   HCT 29.0 (L) 05/16/2017   MCV 64.8 (L) 05/16/2017   PLT 675 (H) 05/16/2017   Lab Results  Component Value Date   CREATININE 0.84 05/16/2017   BUN 13 05/16/2017   NA 143 05/16/2017   K 4.4 05/16/2017   CL 108 05/16/2017   CO2 24 05/16/2017    Anesthesia Physical Anesthesia Plan  ASA: III  Anesthesia Plan: General   Post-op Pain Management:    Induction: Intravenous  PONV Risk Score and Plan: 1 and Ondansetron and Dexamethasone  Airway Management Planned: LMA  Additional Equipment:   Intra-op Plan:   Post-operative Plan: Extubation in OR  Informed Consent: I have reviewed the patients History and Physical, chart, labs and discussed the procedure including the risks, benefits and alternatives for the proposed anesthesia with the patient or authorized representative who has indicated his/her understanding and acceptance.   Dental advisory given  Plan Discussed with: CRNA  Anesthesia Plan Comments: (DNR suspended for case after discussion with pt. )       Anesthesia  Quick Evaluation

## 2017-05-16 NOTE — Progress Notes (Signed)
   31yo M with T4 paraplegia c/b chronic left hip wound, chronic foley. Hx of recurrent uti, presents with worsening drainage from hip wound and leukocytosis  - I was not able to see patient today since he was being taken to the OR  - will plan to do empiric regimen of piptazo alone - no hx of mrsa - will follow up on OR cultures and see him formally tomorrow am  Caren Griffins B. [facility] [phone]

## 2017-05-16 NOTE — Anesthesia Procedure Notes (Signed)
Procedure Name: LMA Insertion Date/Time: 05/16/2017 6:25 PM Performed by: Myna Bright Pre-anesthesia Checklist: Patient identified, Emergency Drugs available, Suction available and Patient being monitored Patient Re-evaluated:Patient Re-evaluated prior to inductionOxygen Delivery Method: Circle system utilized Preoxygenation: Pre-oxygenation with 100% oxygen Intubation Type: IV induction Ventilation: Mask ventilation without difficulty LMA: LMA inserted LMA Size: 5.0 Tube type: Oral Number of attempts: 1 Placement Confirmation: positive ETCO2 and breath sounds checked- equal and bilateral Tube secured with: Tape Dental Injury: Teeth and Oropharynx as per pre-operative assessment

## 2017-05-16 NOTE — Discharge Summary (Signed)
[facility] Discharge Summary  Patient name: Sean Horton Medical record number: [id number] Date of birth: 1986/10/05 Age: 31 y.o. Gender: male Date of Admission: 05/15/2017  Date of Discharge: 05/21/17  Admitting Physician: Alveda Reasons, MD  Primary Care Provider: [name], DO Consultants: ortho, wound care, PT/OT  Indication for Hospitalization: sepsis  Discharge Diagnoses/Problem List:  Left hip soft tissue infection without osteomyelitis Sepsis- resolved Chronic indwelling Foley catheter Recurrent UTI Reflex sympathetic Dystrophy Neurogenic bladder Paraplegia Chronic pain  Disposition: home with [facility]  Discharge Condition: stable, improved  Discharge Exam: see progress note from day of discharge  Brief Hospital Course:  Sean Horton is a 31 year old male with PMH of paraplegia, neurogenic bladder, chronic indwelling Foley catheter, reflex sympathetic dystrophy, HTN, chronic nerve pain,   who presented to [facility] with right hip wound drainage and sepsis. Imaging of hip revealed abscess and gas within the right hip. He was transferred to [facility] and admitted to [facility] for management. Ortho was consulted and took patient to OR on 6/20 for wound wash out. ID was consulted for prolonged antibiotics. Patient was continued on IV Zosyn. He required several blood transfusions for iron deficiency anemia exacerbated by surgery. He was also given IV feraheme prior to discharge home. PT/OT was consulted and had no further recommendations. Plastic surgery saw the patient at the request of ortho for wound healing and closure. They will follow as an outpatient for possible flap reconstruction in the future. Patient was transitioned to oral ciprofloxacin, doxycycline, and metronidazole for 4 week duration. He was discharged home on 05/21/17 with [facility] RN for wound care.   Issues for Follow Up:  1. Encourage smoking cessation 2. Follow up with plastic surgery  for wound care/closure 3. Follow up with orthopedic surgery 4. Recheck hemoglobin in 4 weeks  Wound care recs: -Cleanse wounds to bilateral malleolus with NS and pat gently dry.  Apply Aquacel Ag to wound bed.  Cover with 2x2 and kerlix/tape.  Change daily.  -Cleanse left axilla wound thoroughly with soap and water and pat dry.  Apply Aquacel AG to nonintact wound.  MOisturize surrounding skin with barrier cream.  Cover with 4x4 gauze and tape. Change daily.    Significant Procedures: 6/20- left hip debridement with wound vac placement, 6/23- repeat left hip washout/debridement with change of wound vac  Significant Labs and Imaging:   Recent Labs Lab 05/19/17 0532 05/20/17 0447 05/20/17 2028 05/21/17 0613  WBC 12.1* 13.2*  --  9.4  HGB 7.6* 6.8* 7.9* 7.8*  HCT 26.7* 23.5* 27.7* 28.0*  PLT 591* 580*  --  567*    Recent Labs Lab 05/15/17 1430 05/16/17 0724 05/16/17 1632 05/18/17 0732 05/19/17 0532 05/20/17 0447 05/21/17 0613  NA 139 138 143 139 140 140 140  K 4.3 4.0 4.4 4.4 4.5 4.5 4.5  CL 104 108  --  109 106 107 108  CO2 25 24  --  22 25 25 26   GLUCOSE 198* 100* 107* 144* 84 118* 98  BUN 18 13  --  12 14 15 19   CREATININE 0.90 0.84  --  0.88 0.89 1.06 1.02  CALCIUM 9.3 9.0  --  8.8* 8.9 8.5* 8.9  MG 2.0  --   --   --   --   --   --   ALKPHOS 141*  --   --   --   --   --   --   AST 17  --   --   --   --   --   --  ALT 14*  --   --   --   --   --   --   ALBUMIN 3.1*  --   --   --   --   --   --     Ct Pelvis W Contrast Result Date: 05/15/2017  IMPRESSION: Large ulceration over the left hip with gas extending to the posterior aspect of the remnant of the greater trochanter. No CT signs of acute osteomyelitis are identified but gas extending to bone is worrisome for infection. Marked thickening of soft tissues about the left hip is compatible granulation tissue. Low attenuation collection deep to the posterior aspect of the ulceration is worrisome for abscess and  contiguous with bone. Chronic osteolysis versus resection of the right femoral head. There is extensive heterotopic ossification about the left hip with changes consistent with chronic/remote osteomyelitis. Findings consistent with chronic/remote osteomyelitis of the coccyx. Bone-on-bone right hip joint space narrowing. Heterotopic calcification off the right greater trochanter may be due to old trauma or infection. Soft tissue thickening over the right ischial tuberosity is consistent with pressure change. No abscess or osteomyelitis. Electronically Signed   By: Inge Rise M.D.   On: 05/15/2017 16:32   Results/Tests Pending at Time of Discharge: final blood culture  Discharge Medications:  Allergies as of 05/21/2017   No Known Allergies     Medication List    TAKE these medications   acetaminophen 325 MG tablet Commonly known as:  TYLENOL Take 2 tablets (650 mg total) by mouth every 6 (six) hours.   baclofen 20 MG tablet Commonly known as:  LIORESAL Take 40 mg by mouth 2 (two) times daily.   Chlorhexidine Gluconate Cloth 2 % Pads Apply 6 each topically daily. Start taking on:  05/22/2017   ciprofloxacin 500 MG tablet Commonly known as:  CIPRO Take 1 tablet (500 mg total) by mouth 2 (two) times daily.   collagenase ointment Commonly known as:  SANTYL Apply 1 application topically daily as needed (for wound care).   Cranberry 1000 MG Caps Take 2,000 mg by mouth 2 (two) times daily.   cyanocobalamin 1000 MCG tablet Take 1 tablet (1,000 mcg total) by mouth daily. Start taking on:  05/22/2017   doxycycline 100 MG tablet Commonly known as:  VIBRA-TABS Take 1 tablet (100 mg total) by mouth every 12 (twelve) hours.   DULoxetine 60 MG capsule Commonly known as:  CYMBALTA Take 60 mg by mouth 2 (two) times daily.   ferrous sulfate 325 (65 FE) MG EC tablet Take 1 tablet (325 mg total) by mouth 2 (two) times daily.   fexofenadine-pseudoephedrine 180-240 MG 24 hr  tablet Commonly known as:  ALLEGRA-D ALLERGY & CONGESTION Take 1 tablet by mouth daily.   folic acid 1 MG tablet Commonly known as:  FOLVITE Take 1 mg by mouth daily.   ibuprofen 800 MG tablet Commonly known as:  ADVIL,MOTRIN Take 1 tablet (800 mg total) by mouth every 8 (eight) hours as needed. What changed:  reasons to take this   metroNIDAZOLE 500 MG tablet Commonly known as:  FLAGYL Take 1 tablet (500 mg total) by mouth every 8 (eight) hours.   nitrofurantoin (macrocrystal-monohydrate) 100 MG capsule Commonly known as:  MACROBID Take 1 capsule (100 mg total) by mouth 2 (two) times daily.   omeprazole 20 MG capsule Commonly known as:  PRILOSEC Take 1 capsule (20 mg total) by mouth daily.   oxybutynin 5 MG tablet Commonly known as:  DITROPAN Take 1 tablet (  5 mg total) by mouth 3 (three) times daily.   oxyCODONE 5 MG immediate release tablet Commonly known as:  Oxy IR/ROXICODONE Take 1 tablet (5 mg total) by mouth every 6 (six) hours as needed for moderate pain or severe pain.   oxyCODONE-acetaminophen 10-325 MG tablet Commonly known as:  PERCOCET Take 1 tablet by mouth every 8 (eight) hours as needed for pain.   polyethylene glycol packet Commonly known as:  MIRALAX / GLYCOLAX Take 17 g by mouth daily as needed for mild constipation.   pregabalin 300 MG capsule Commonly known as:  LYRICA Take 300 mg by mouth 2 (two) times daily.   XTAMPZA ER 27 MG C12a Generic drug:  OxyCODONE ER Take 27 mg by mouth every 12 (twelve) hours.   zinc sulfate 220 (50 Zn) MG capsule Take 1 capsule (220 mg total) by mouth daily.       Discharge Instructions: Please refer to Patient Instructions section of EMR for full details.  Patient was counseled important signs and symptoms that should prompt return to medical care, changes in medications, dietary instructions, activity restrictions, and follow up appointments.   Follow-Up Appointments: Follow-up Information    [facility]             . Schedule an appointment as soon as possible for a visit in 1 week(s).   Contact information: [address] [id number] [place], [place], DO. Schedule an appointment as soon as possible for a visit.   Contact information: [place] [address] [phone]           [name], DO 05/21/2017, 3:03 PM PGY-1, [place]

## 2017-05-16 NOTE — Brief Op Note (Signed)
05/15/2017 - 05/16/2017  7:27 PM  PATIENT:  Sean Horton  31 y.o. male  PRE-OPERATIVE DIAGNOSIS:  Chronic left hip wound  POST-OPERATIVE DIAGNOSIS:  Chronic left hip wound  PROCEDURE:  Procedure(s): IRRIGATION AND DEBRIDEMENT WOUND LEFT HIP (Left) APPLICATION OF WOUND VAC (Left)  SURGEON:  Surgeon(s) and Role:    Mcarthur Rossetti, MD - Primary  ANESTHESIA:   general  EBL:  Total I/O In: 500 [I.V.:500] Out: 100 [Blood:100]  COUNTS:  YES  TOURNIQUET:  * No tourniquets in log *  PLAN OF CARE: Admit to inpatient   PATIENT DISPOSITION:  PACU - hemodynamically stable.   Delay start of Pharmacological VTE agent (>24hrs) due to surgical blood loss or risk of bleeding: no

## 2017-05-16 NOTE — Anesthesia Postprocedure Evaluation (Signed)
Anesthesia Post Note  Patient: Sean Horton  Procedure(s) Performed: Procedure(s) (LRB): IRRIGATION AND DEBRIDEMENT WOUND LEFT HIP (Left) APPLICATION OF WOUND VAC (Left)     Patient location during evaluation: PACU Anesthesia Type: General Level of consciousness: sedated, patient cooperative and oriented Pain management: pain level controlled Vital Signs Assessment: post-procedure vital signs reviewed and stable Respiratory status: spontaneous breathing, nonlabored ventilation, respiratory function stable and patient connected to nasal cannula oxygen Cardiovascular status: blood pressure returned to baseline and stable Postop Assessment: no signs of nausea or vomiting Anesthetic complications: no    Last Vitals:  Vitals:   05/16/17 2015 05/16/17 2045  BP: 115/78 133/75  Pulse: 67 68  Resp: 19 17  Temp: 36.2 C 37 C    Last Pain:  Vitals:   05/16/17 2045  TempSrc: Oral  PainSc:                  Gissella Niblack,E. Kylene Zamarron

## 2017-05-16 NOTE — Consult Note (Addendum)
[place] Nurse wound consult note Reason for Consult:Stage 4 pressure injury to left trochanter.  Has had surgery at [facility] to this area and now wound is draining purulent green effluent.  Bilateral unstageable pressure injuries to  malleolus.  Foul odor and purulence noted.  Wound type:Pressure injury.  S/P surgery and chronic osteomyelitis to left trochanter.  Surgery has been consulted.  Will order topical therapy for now to manage exudate.  Pressure Injury POA: Yes Measurement:Left trochanter:  6 cm x 5.2 cm scar tissue with effluent coming from proximal area.  Pinpoint opening.  Pink scar tissue to wound bed.   Left medial malleolus:  2 cm x 2 cm x 0.2 cm ruddy red, friable wound bed with malodorous effluent.  Right lateral malleolus:  2.4 cm x 2 cm x 0.2 cm ruddy red, friable wound bed with malodorous effluent.  Left axilla with full thickness injury from intertriginous dermatitis.  3 cm x 2 cm x 0.3 cm   Surrounding skin is flaky and dry.  Wound SLH:TDSKA red, friable Drainage (amount, consistency, odor) Moderate purulence with musty effluent.  Periwound: Has scattered 0.1 cm openings to lower legs. Wound bed is pink and moist.   Dressing procedure/placement/frequency:CLeanse wound to left hip with NS.  Apply Aquacel Ag to wound bed.  Cover with ABD pad and tape.  Change daily.  Awaiting surgical consult.   Cleanse wounds to bilateral malleolus with NS and pat gently dry.  Apply Aquacel Ag to wound bed.  Cover with 2x2 and kerlix/tape.  Change daily.   Cleanse left axilla wound thoroughly with soap and water and pat dry.  Apply Aquacel AG to nonintact wound.  MOisturize surrounding skin with barrier cream.  Cover with 4x4 gauze and tape. Change daily.    Will not follow at this time.  Please re-consult if needed.  Domenic Moras RN BSN [place] Pager [phone]

## 2017-05-16 NOTE — Progress Notes (Signed)
Consulted ID, and spoke with Dr. Baxter Flattery, who recommended holding antibiotics prior to ortho washout tonight in order to obtain more accurate cultures for better long-term antibiotic guidance. Will hold vanc/zosyn for now and likely resume after procedure tonight. Appreciate Dr. Storm Frisk recommendations.   Sean Hector, MD, MPH PGY-2 [place] [facility] Pager [phone]
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
## 2017-05-17 ENCOUNTER — Encounter (HOSPITAL_COMMUNITY): Payer: Self-pay | Admitting: Orthopaedic Surgery

## 2017-05-17 ENCOUNTER — Telehealth (INDEPENDENT_AMBULATORY_CARE_PROVIDER_SITE_OTHER): Payer: Self-pay | Admitting: *Deleted

## 2017-05-17 DIAGNOSIS — Z79899 Other long term (current) drug therapy: Secondary | ICD-10-CM

## 2017-05-17 DIAGNOSIS — F17210 Nicotine dependence, cigarettes, uncomplicated: Secondary | ICD-10-CM

## 2017-05-17 DIAGNOSIS — Z79891 Long term (current) use of opiate analgesic: Secondary | ICD-10-CM

## 2017-05-17 DIAGNOSIS — G8921 Chronic pain due to trauma: Secondary | ICD-10-CM

## 2017-05-17 LAB — BPAM RBC
Blood Product Expiration Date: 201806262359
Blood Product Expiration Date: 201806262359
ISSUE DATE / TIME: 201806200916
ISSUE DATE / TIME: 201806201237
UNIT TYPE AND RH: 9500
Unit Type and Rh: 9500

## 2017-05-17 LAB — TYPE AND SCREEN
ABO/RH(D): O POS
ANTIBODY SCREEN: NEGATIVE
UNIT DIVISION: 0
UNIT DIVISION: 0

## 2017-05-17 LAB — CBC
HCT: 25.1 % — ABNORMAL LOW (ref 39.0–52.0)
Hemoglobin: 7.3 g/dL — ABNORMAL LOW (ref 13.0–17.0)
MCH: 19.8 pg — ABNORMAL LOW (ref 26.0–34.0)
MCHC: 29.1 g/dL — ABNORMAL LOW (ref 30.0–36.0)
MCV: 68 fL — AB (ref 78.0–100.0)
Platelets: 586 10*3/uL — ABNORMAL HIGH (ref 150–400)
RBC: 3.69 MIL/uL — ABNORMAL LOW (ref 4.22–5.81)
RDW: 22.2 % — AB (ref 11.5–15.5)
WBC: 14.3 10*3/uL — AB (ref 4.0–10.5)

## 2017-05-17 LAB — URINE CULTURE: CULTURE: NO GROWTH

## 2017-05-17 MED ORDER — HYDROMORPHONE HCL 1 MG/ML IJ SOLN
1.0000 mg | INTRAMUSCULAR | Status: DC | PRN
  Administered 2017-05-17 – 2017-05-21 (×31): 1 mg via INTRAVENOUS
  Filled 2017-05-17 (×31): qty 1

## 2017-05-17 MED ORDER — VITAMIN B-12 1000 MCG PO TABS
1000.0000 ug | ORAL_TABLET | Freq: Every day | ORAL | Status: DC
  Administered 2017-05-17 – 2017-05-21 (×5): 1000 ug via ORAL
  Filled 2017-05-17 (×5): qty 1

## 2017-05-17 NOTE — Care Management (Signed)
Patient has VAC on . Called Dr Whitney Muse office spoke with [phone]. Will need wound measurements and home VAC and Medicaid form signed. Sabrina aware.   Magdalen Spatz RN [phone]

## 2017-05-17 NOTE — Progress Notes (Signed)
  Per Dr. Trevor Mace recommendations, left voicemail for Dr Theodoro Kos Dillingham requesting consultation from Plastic Surgery for further wound treatment and eventual closure. Left information for intern pager number to return call.  Lucila Maine, DO PGY-1, [facility] 05/17/2017 3:09 PM
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
## 2017-05-17 NOTE — Op Note (Signed)
NAME:  BARNET, BENAVIDES                      ACCOUNT NO.:  MEDICAL RECORD NO.:  [id number]  LOCATION:                                 FACILITY:  PHYSICIAN:  Lind Guest. Ninfa Linden, M.D.DATE OF BIRTH:  DATE OF PROCEDURE:  05/15/2017 DATE OF DISCHARGE:                              OPERATIVE REPORT   PREOPERATIVE DIAGNOSIS:  Left chronic hip wound with chronic drainage and gross purulence.  POSTOPERATIVE DIAGNOSIS:  Left chronic hip wound with chronic drainage and gross purulence.  PROCEDURE:  Irrigation and debridement of left hip wound including sharp excisional debridement of necrotic skin and fascia only.  No evidence of osteomyelitis with hard sclerotic, but exposed proximal femur.  SURGEON:  Lind Guest. Ninfa Linden, M.D.  ASSISTANT:  OR staff.  BLOOD LOSS:  Less than 200 mL.  COMPLICATIONS:  None.  INDICATIONS:  Mr. Langhorst is a 31 year old, paraplegic, who has had problems with a chronic left hip wound for many years now.  Apparently, he has had multiple surgeries on this hip with the last one being several years ago, that is a Girdlestone procedure.  He has no hip femoral head.  He has developed a chronically draining wound, he said for 7 years now.  Apparently, he has been referred at one point to Plastic Surgery in [place].  He presented to the emergency room yesterday, appearing heading towards sepsis.  He had gross purulence coming from his left hip wound.  CT scan was concerning for infection, which showed no evidence of osteomyelitis of the bone, with sclerotic changes of the proximal femur.  I agreed with this on x-ray, but do feel with the necrotic nature of his skin and gross purulence that he needed a thorough irrigation, debridement, and assessment of the proximal femur as well.  The risks and benefits were explained to him in detail and he did understand the need to proceed with surgery.  PROCEDURE DESCRIPTION:  After informed consent was obtained,  appropriate left hip was marked.  He was brought to the operating room, placed supine on the operating table.  General anesthesia was then obtained.  A bump was placed under his left hip and we could easily see his proximal femur and wound.  This area was prepped and draped with Betadine scrub and paint.  A time-out was called and he was identified as correct patient and correct left hip.  We then excised necrotic-appearing skin sharply with a #10 blade, removing skin and fascia, getting to a gross purulent area and a small pocket of an abscess.  I was able to assess the proximal femur and there was one sharp edge of bone that I used a rongeur to remove and it was very hard and sclerotic.  I probed the exposed distal femur and found no areas of soft bone or osteomyelitis. Once I sharply removed some necrotic fascia and thoroughly suctioned out the wound, I then irrigated with 6 L of normal saline solution using pulsatile lavage.  I then placed a decub VAC sponge deep and superficial and placed a seal over this and put at -125 mm continuous suction and got a good seal.  He was  awakened, extubated, and taken to the recovery room in stable condition.  All final counts were correct.  There were no complications noted.  Postoperatively, he will need extensive wound treatment and wound care with likely referral to Plastic Surgery for eventual coverage.     Lind Guest. Ninfa Linden, M.D.     CYB/MEDQ  D:  05/16/2017  T:  05/16/2017  Job:  [id number]

## 2017-05-17 NOTE — Telephone Encounter (Signed)
Please advise 

## 2017-05-17 NOTE — Progress Notes (Signed)
  Family Medicine Teaching Service Daily Progress Note Intern Pager: [phone]  Patient name: Sean Horton Medical record number: [id number] Date of birth: 10-24-1986 Age: 31 y.o. Gender: male  Primary Care Provider: [name], DO Consultants: orthopedics, ID Code Status: full  Pt Overview and Major Events to Date:  6/19- admitted to [facility] 6/20- irrigation and debridement of left hip, wound vac  Assessment and Plan: Sean Horton is a 31 y.o. male presenting with L hip infection. PMH is significant for paraplegia with neurogenic bladder and chronic indwelling foley.  Sepsis likely 2/2 L hip infection- VSS, afebrile. WBC trend 19.3 > 15.4 > 12.9.  - vital signs per floor  - monitor on telemetry - Orthopedics following, wound vac in place  - ID following, recommend treating with empiric zosyn, await cultures to narrow  - blood cultures no growth x 24 hours - consult to wound care  Recent UTI tx with Macrobid-  - curine culture no growth - continue oxybutynin - continue zosyn for infection as above   Anemia. On admission Hgb 8.0 > 6.7 > 6.3. S/p 2 units PRBC, post-transfusion H/H 9.9 - Monitor CBC closely - B12 low, supplement today  Chronic nerve pain 2/2 paraplegia. At home on baclofen, cymbalta, lyrica. Patient has had some worsening of his nerve pain with acute infection but is maxed out on those home medications. Also on oxycodone ER 27mg  q12h and prn percocet 10-325mg  q8prn. - continue home meds - prn dilaudid 1 mg q2H for severe, breakthrough pain  GERD- takes omeprazole at home - continue ppi  FEN/GI: regular diet Prophylaxis: lovenox  Disposition: pending clinical improvement  Subjective:  Doing well, no fevers or chills. Pain is hard to control. Breakthrough pain is an aching 5/10  Objective: Temp:  [97 F (36.1 C)-99.3 F (37.4 C)] 99.3 F (37.4 C) (06/21 0908) Pulse Rate:  [67-95] 74 (06/21 0908) Resp:  [11-21] 17 (06/21 0908) BP:  (96-133)/(60-78) 115/67 (06/21 0908) SpO2:  [98 %-100 %] 100 % (06/21 0908) Weight:  [200 lb (90.7 kg)] 200 lb (90.7 kg) (06/20 1623) Physical Exam: General: pleasant man sitting up in bed in NAD Cardiovascular: RRR no MRG Respiratory: CTAB no increased WOB Abdomen: soft NTND  Laboratory:  Recent Labs Lab 05/15/17 1430 05/15/17 1652 05/16/17 0724 05/16/17 1632  WBC 19.3* 15.4* 12.9*  --   HGB 8.0* 6.7* 6.3* 9.9*  HCT 26.8* 23.0* 22.8* 29.0*  PLT 818* 680* 675*  --     Recent Labs Lab 05/15/17 1430 05/16/17 0724 05/16/17 1632  NA 139 138 143  K 4.3 4.0 4.4  CL 104 108  --   CO2 25 24  --   BUN 18 13  --   CREATININE 0.90 0.84  --   CALCIUM 9.3 9.0  --   PROT 9.7*  --   --   BILITOT 0.6  --   --   ALKPHOS 141*  --   --   ALT 14*  --   --   AST 17  --   --   GLUCOSE 198* 100* 107*   LA 1.35 HIV NR B12 low at 136 Folate 17.6 Iron Low 14, TIBC normal 252, Sat low 6 Ferritin normal 94  Imaging/Diagnostic Tests: No results found. Sean Rattler, DO 05/17/2017, 9:15 AM PGY-1, [place] Intern pager: [phone], text pages welcome

## 2017-05-17 NOTE — Progress Notes (Signed)
Patient ID: Sean Horton, male   DOB: 03/20/1986, 31 y.o.   MRN: [id number]  No acute changes post-op.  I did not find any osteo, but there was necrotic tissue that was debrided.  He now has a VAC over the wound.  He needs a consultation from Plastic Surgery at this point to help with further wound treatment and eventual closure.  The primary team will need to consult Dr. Marla Roe or Iran Planas from Plastic Surgery for further evaluation and treatment.  The [facility] can be removed at the bedside if needed for an evaluation.  I can consider a return trip to the OR this weekend if needed.

## 2017-05-17 NOTE — Telephone Encounter (Signed)
Case Architectural technologist at [facility] called needing a signature for Home Wound Vac and Medicaid forms signed and also needs measurements states it wasn't written on sheet.   C/B [phone]

## 2017-05-17 NOTE — Progress Notes (Signed)
[facility] Infusion Coordinator will follow hospital course with ID team to support Home Infusion Pharmacy services for Home IV ABX at DC as ordered.  If patient discharges after hours, please call [phone].   Larry Sierras 05/17/2017, 11:09 PM

## 2017-05-18 LAB — BASIC METABOLIC PANEL
Anion gap: 8 (ref 5–15)
BUN: 12 mg/dL (ref 6–20)
CHLORIDE: 109 mmol/L (ref 101–111)
CO2: 22 mmol/L (ref 22–32)
CREATININE: 0.88 mg/dL (ref 0.61–1.24)
Calcium: 8.8 mg/dL — ABNORMAL LOW (ref 8.9–10.3)
GFR calc non Af Amer: >60 mL/min (ref >60)
Glucose, Bld: 144 mg/dL — ABNORMAL HIGH (ref 65–99)
POTASSIUM: 4.4 mmol/L (ref 3.5–5.1)
SODIUM: 139 mmol/L (ref 135–145)

## 2017-05-18 LAB — CBC
HCT: 26.9 % — ABNORMAL LOW (ref 39.0–52.0)
HEMATOCRIT: 27.3 % — AB (ref 39.0–52.0)
HEMOGLOBIN: 7.6 g/dL — AB (ref 13.0–17.0)
Hemoglobin: 7.8 g/dL — ABNORMAL LOW (ref 13.0–17.0)
MCH: 19.7 pg — AB (ref 26.0–34.0)
MCH: 19.9 pg — ABNORMAL LOW (ref 26.0–34.0)
MCHC: 28.3 g/dL — AB (ref 30.0–36.0)
MCHC: 28.6 g/dL — ABNORMAL LOW (ref 30.0–36.0)
MCV: 69.7 fL — ABNORMAL LOW (ref 78.0–100.0)
MCV: 69.6 fL — AB (ref 78.0–100.0)
PLATELETS: 637 10*3/uL — AB (ref 150–400)
Platelets: 635 10*3/uL — ABNORMAL HIGH (ref 150–400)
RBC: 3.86 MIL/uL — AB (ref 4.22–5.81)
RBC: 3.92 MIL/uL — AB (ref 4.22–5.81)
RDW: 23.1 % — ABNORMAL HIGH (ref 11.5–15.5)
RDW: 23.2 % — ABNORMAL HIGH (ref 11.5–15.5)
WBC: 13.6 10*3/uL — ABNORMAL HIGH (ref 4.0–10.5)
WBC: 13.5 10*3/uL — AB (ref 4.0–10.5)

## 2017-05-18 LAB — PREALBUMIN: Prealbumin: 9.6 mg/dL — ABNORMAL LOW (ref 18–38)

## 2017-05-18 MED ORDER — MUPIROCIN 2 % EX OINT
TOPICAL_OINTMENT | Freq: Two times a day (BID) | CUTANEOUS | Status: DC
  Administered 2017-05-18 – 2017-05-20 (×6): via NASAL
  Administered 2017-05-21: 1 via NASAL
  Filled 2017-05-18 (×4): qty 22

## 2017-05-18 NOTE — Care Management (Signed)
Please sign home VAC and Medicaid forms in shadow chart.   Thanks Magdalen Spatz RN BSN [phone]

## 2017-05-18 NOTE — Progress Notes (Signed)
Pharmacy Antibiotic Note  Sean Horton is a 31 y.o. male admitted on 05/15/2017 with hip infection.  Pharmacy has been consulted for Zosyn dosing. Patient is paraplegic with chronic indwelling catheter. Renal function is stable. Plan is for 4 weeks of Zosyn.  Plan: Continue Zosyn 3.375g IV q8h (4 hour infusion).  Pharmacy signing off, please re-consult if needed   Height: 6\' 5"  (062.3 cm) Weight: 200 lb (90.7 kg) IBW/kg (Calculated) : 89.1  Temp (24hrs), Avg:98 F (36.7 C), Min:97.7 F (36.5 C), Max:98.2 F (36.8 C)   Recent Labs Lab 05/15/17 1430 05/15/17 1652 05/15/17 1812 05/16/17 0724 05/17/17 1514 05/18/17 0732 05/18/17 1220  WBC 19.3* 15.4*  --  12.9* 14.3* 13.6* 13.5*  CREATININE 0.90  --   --  0.84  --  0.88  --   LATICACIDVEN  --   --  1.35  --   --   --   --     Estimated Creatinine Clearance: 153.3 mL/min (by C-G formula based on SCr of 0.88 mg/dL).    No Known Allergies   Thank you for allowing pharmacy to be a part of this patient's care.  Renold Genta, PharmD, BCPS Clinical Pharmacist Phone for today - [place] - [phone] 05/18/2017 2:19 PM

## 2017-05-18 NOTE — Progress Notes (Signed)
OT Cancellation Note  Patient Details Name: Sean Horton MRN: [id number] DOB: 11-02-1986   Cancelled Treatment:    Reason Eval/Treat Not Completed: OT screened, no needs identified, will sign off. Pt performing ADLs and functional mobility at w/c level Mod I and mother assists with grocery shoppign and driving  [name] 05/18/2017, 10:46 AM

## 2017-05-18 NOTE — Progress Notes (Signed)
  Family Medicine Teaching Service Daily Progress Note Intern Pager: [phone]  Patient name: Sean Horton Medical record number: [id number] Date of birth: [birth year] Age: 31 y.o. Gender: male  Primary Care Provider: [name], DO Consultants: orthopedics, ID Code Status: full  Pt Overview and Major Events to Date:  6/19- admitted to [facility] 6/20- irrigation and debridement of left hip, wound vac  Assessment and Plan: RASHAWD LASKARIS is a 31 y.o. male presenting with L hip infection. PMH is significant for paraplegia with neurogenic bladder and chronic indwelling foley.  Sepsis likely 2/2 L hip infection- VSS, afebrile. WBC trend 19.3 > 15.4 > 12.9.  - vital signs per floor  - monitor on telemetry - Orthopedics following, wound vac in place with serosanguinous drainage. Possibly returning to OR this weekend for second wash out.  - ID following, recommend treating with empiric zosyn, will need 4 weeks of antibiotics - blood cultures no growth x 2 days - consult to wound care - consult to plastic surgery for wound management and wound closure  Recent UTI tx with Macrobid- resolved. Repeat curine culture no growth - continue oxybutynin - continue zosyn for infection as above   Anemia. On admission Hgb 8.0 > 6.7 > 6.3. S/p 2 units PRBC, post-transfusion H/H 9.9 > 7.3  - Monitor CBC closely, recheck CBC at 2pm  - B12 low, supplement today  Chronic nerve pain 2/2 paraplegia. At home on baclofen, cymbalta, lyrica. Patient has had some worsening of his nerve pain with acute infection but is maxed out on those home medications. Also on oxycodone ER 27mg  q12h and prn percocet 10-325mg  q8prn. - continue home meds - prn dilaudid 1 mg q2H for severe, breakthrough pain  GERD- takes omeprazole at home - continue ppi  FEN/GI: regular diet Prophylaxis: lovenox  Disposition: pending clinical improvement  Subjective:  Feels well, no fevers or chills. Appetite is good.    Objective: Temp:  [97.7 F (36.5 C)-99.3 F (37.4 C)] 97.7 F (36.5 C) (06/22 0652) Pulse Rate:  [54-80] 54 (06/22 0652) Resp:  [17-18] 18 (06/22 0652) BP: (102-118)/(51-70) 102/56 (06/22 0652) SpO2:  [97 %-100 %] 97 % (06/22 9702) Physical Exam: General: pleasant man sitting up in bed in NAD Cardiovascular: RRR no MRG Respiratory: CTAB no increased WOB Abdomen: soft NTND  Laboratory:  Recent Labs Lab 05/15/17 1652 05/16/17 0724 05/16/17 1632 05/17/17 1514  WBC 15.4* 12.9*  --  14.3*  HGB 6.7* 6.3* 9.9* 7.3*  HCT 23.0* 22.8* 29.0* 25.1*  PLT 680* 675*  --  586*    Recent Labs Lab 05/15/17 1430 05/16/17 0724 05/16/17 1632  NA 139 138 143  K 4.3 4.0 4.4  CL 104 108  --   CO2 25 24  --   BUN 18 13  --   CREATININE 0.90 0.84  --   CALCIUM 9.3 9.0  --   PROT 9.7*  --   --   BILITOT 0.6  --   --   ALKPHOS 141*  --   --   ALT 14*  --   --   AST 17  --   --   GLUCOSE 198* 100* 107*   LA 1.35 HIV NR B12 low at 136 Folate 17.6 Iron Low 14, TIBC normal 252, Sat low 6 Ferritin normal 94  Imaging/Diagnostic Tests: No results found. [name], DO 05/18/2017, 8:19 AM PGY-1, [place] Intern pager: [phone], text pages welcome

## 2017-05-18 NOTE — Progress Notes (Signed)
[facility] for Infectious Disease    Date of Admission:  05/15/2017   Total days of antibiotics 4        Day 4 piptazo        ID: Sean Horton is a 31 y.o. male with paraplegia and chronic left hip wound with deep tissue abscess s/p debridement and wound vac Active Problems:   Wound infection   Sepsis ([place])   Acute cystitis without hematuria    Subjective: Afebrile, has been scratching his left axilla area. Having neuropathic pain to hip Medications:  . baclofen  40 mg Oral BID  . DULoxetine  60 mg Oral BID  . enoxaparin (LOVENOX) injection  40 mg Subcutaneous Q24H  . folic acid  1 mg Oral Daily  . loratadine  10 mg Oral Daily  . mupirocin ointment   Nasal BID  . oxybutynin  5 mg Oral TID  . oxyCODONE  30 mg Oral Q12H  . pantoprazole  40 mg Oral Daily  . pregabalin  300 mg Oral BID  . sodium chloride flush  3 mL Intravenous Q12H  . vitamin B-12  1,000 mcg Oral Daily  . zinc sulfate  220 mg Oral Daily    Objective: Vital signs in last 24 hours: Temp:  [97.7 F (36.5 C)-98.6 F (37 C)] 98.6 F (37 C) (06/22 1354) Pulse Rate:  [54-87] 87 (06/22 1354) Resp:  [18] 18 (06/22 1354) BP: (102-137)/(51-84) 137/84 (06/22 1354) SpO2:  [97 %-100 %] 99 % (06/22 1354) Physical Exam  Constitutional: He is oriented to person, place, and time. He appears well-developed and well-nourished. No distress. Watching tv HENT:  Mouth/Throat: Oropharynx is clear and moist. No oropharyngeal exudate.  Cardiovascular: Normal rate, regular rhythm and normal heart sounds. Exam reveals no gallop and no friction rub.  No murmur heard.  Ext= left hip wound vac in place Neurological: He is alert and oriented to person, place, and time. Lower extremity flaccid paralysis Skin: bandaging from yesterday to his arm is intact but axilla, shoulder bandaging is dangling with brown paper towel in place Psychiatric: He has a normal mood and affect. His behavior is normal.    Lab Results  Recent  Labs  05/16/17 0724 05/16/17 1632  05/18/17 0732 05/18/17 1220  WBC 12.9*  --   < > 13.6* 13.5*  HGB 6.3* 9.9*  < > 7.6* 7.8*  HCT 22.8* 29.0*  < > 26.9* 27.3*  NA 138 143  --  139  --   K 4.0 4.4  --  4.4  --   CL 108  --   --  109  --   CO2 24  --   --  22  --   BUN 13  --   --  12  --   CREATININE 0.84  --   --  0.88  --   < > = values in this interval not displayed. Lab Results  Component Value Date   ESRSEDRATE 73 (H) 07/18/2015    Microbiology:  6/19 blood cx ngtd Studies/Results: No results found.   Assessment/Plan: Deep tissue infection of left hip = please send cultures from tomorrow's debridement for aerobic culture.  He is currently on piptazo but I don't think he would be a good candidate for IV abtx. He lives alone and cares for a small puppy. He is also constantly scratching at wounds on shoulder which makes me concern that a picc line could easily be infected or dislodged.  Appreciate  plastics consultation - patient states he will not likely attend his 7/2 appt since he is unable to offload for 6 wk due to his responsibilities at home with his young dog.   For now keep on piptazo, would send him out on oral regimen of doxycycline 100mg  BID plus cipro 500mg  BID plus metronidazole 500mg  po TID x 4 wk.  Superficial wounds on torso/shoulder/axilla = follow wound care recs  I will be back on Monday to see patient. Dr Megan Salon available for questions over the weekend.  Baxter Flattery [facility] for Infectious Diseases Cell: [phone] Pager: [phone]  05/18/2017, 7:10 PM
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
## 2017-05-18 NOTE — Congregational Nurse Program (Signed)
[place] consulted 05/17/17 for left axillary wound, please see consultation note and orders written. [place], [place] Digirolamo, [place]

## 2017-05-18 NOTE — Progress Notes (Signed)
Patient ID: Sean Horton, male   DOB: 12-Dec-1985, 31 y.o.   MRN: [id number] I have decided to take Mr. Garrido back to surgery tomorrow am (Sat.) for a repeat I&D and VAC change to his left hip wound.  He understands this fully and agrees.  After that, his VAC can be changed at the bedside or home every 3-4 days pending further Plastics evaluation.

## 2017-05-19 ENCOUNTER — Encounter (HOSPITAL_COMMUNITY)
Admission: EM | Disposition: A | Discharge status: Home Health | Payer: Self-pay | Source: Home / Self Care | Attending: Family Medicine

## 2017-05-19 ENCOUNTER — Inpatient Hospital Stay (HOSPITAL_COMMUNITY): Payer: Medicaid Other | Admitting: Certified Registered Nurse Anesthetist

## 2017-05-19 ENCOUNTER — Encounter (HOSPITAL_COMMUNITY): Payer: Self-pay | Admitting: Anesthesiology

## 2017-05-19 HISTORY — PX: APPLICATION OF WOUND VAC: SHX5189

## 2017-05-19 HISTORY — PX: INCISION AND DRAINAGE HIP: SHX1801

## 2017-05-19 LAB — BASIC METABOLIC PANEL
ANION GAP: 9 (ref 5–15)
BUN: 14 mg/dL (ref 6–20)
CALCIUM: 8.9 mg/dL (ref 8.9–10.3)
CO2: 25 mmol/L (ref 22–32)
CREATININE: 0.89 mg/dL (ref 0.61–1.24)
Chloride: 106 mmol/L (ref 101–111)
GFR calc Af Amer: >60 mL/min (ref >60)
GLUCOSE: 84 mg/dL (ref 65–99)
Potassium: 4.5 mmol/L (ref 3.5–5.1)
Sodium: 140 mmol/L (ref 135–145)

## 2017-05-19 LAB — CBC
HCT: 26.7 % — ABNORMAL LOW (ref 39.0–52.0)
Hemoglobin: 7.6 g/dL — ABNORMAL LOW (ref 13.0–17.0)
MCH: 20 pg — ABNORMAL LOW (ref 26.0–34.0)
MCHC: 28.5 g/dL — AB (ref 30.0–36.0)
MCV: 70.3 fL — AB (ref 78.0–100.0)
Platelets: 591 10*3/uL — ABNORMAL HIGH (ref 150–400)
RBC: 3.8 MIL/uL — ABNORMAL LOW (ref 4.22–5.81)
RDW: 23.7 % — AB (ref 11.5–15.5)
WBC: 12.1 10*3/uL — AB (ref 4.0–10.5)

## 2017-05-19 SURGERY — IRRIGATION AND DEBRIDEMENT HIP
Anesthesia: General | Site: Hip | Laterality: Left

## 2017-05-19 MED ORDER — LIDOCAINE 2% (20 MG/ML) 5 ML SYRINGE
INTRAMUSCULAR | Status: DC | PRN
Start: 2017-05-19 — End: 2017-05-19
  Administered 2017-05-19: 60 mg via INTRAVENOUS

## 2017-05-19 MED ORDER — ONDANSETRON HCL 4 MG/2ML IJ SOLN
INTRAMUSCULAR | Status: DC | PRN
  Administered 2017-05-19: 4 mg via INTRAVENOUS

## 2017-05-19 MED ORDER — SODIUM CHLORIDE 0.9 % IR SOLN
Status: DC | PRN
  Administered 2017-05-19: 3000 mL

## 2017-05-19 MED ORDER — LIDOCAINE 2% (20 MG/ML) 5 ML SYRINGE
INTRAMUSCULAR | Status: AC
  Filled 2017-05-19: qty 5

## 2017-05-19 MED ORDER — MIDAZOLAM HCL 2 MG/2ML IJ SOLN
INTRAMUSCULAR | Status: AC
  Filled 2017-05-19: qty 2

## 2017-05-19 MED ORDER — LACTATED RINGERS IV SOLN
INTRAVENOUS | Status: DC | PRN
  Administered 2017-05-19: 09:00:00 via INTRAVENOUS

## 2017-05-19 MED ORDER — FENTANYL CITRATE (PF) 250 MCG/5ML IJ SOLN
INTRAMUSCULAR | Status: AC
  Filled 2017-05-19: qty 5

## 2017-05-19 MED ORDER — FENTANYL CITRATE (PF) 100 MCG/2ML IJ SOLN
INTRAMUSCULAR | Status: DC | PRN
  Administered 2017-05-19 (×2): 50 ug via INTRAVENOUS

## 2017-05-19 MED ORDER — CHLORHEXIDINE GLUCONATE CLOTH 2 % EX PADS
6.0000 | MEDICATED_PAD | Freq: Every day | CUTANEOUS | Status: DC
  Administered 2017-05-19 – 2017-05-21 (×3): 6 via TOPICAL

## 2017-05-19 MED ORDER — PROPOFOL 10 MG/ML IV BOLUS
INTRAVENOUS | Status: AC
  Filled 2017-05-19: qty 20

## 2017-05-19 MED ORDER — ONDANSETRON HCL 4 MG/2ML IJ SOLN
INTRAMUSCULAR | Status: AC
  Filled 2017-05-19: qty 2

## 2017-05-19 MED ORDER — MUPIROCIN 2 % EX OINT: 1.0000 "application " | TOPICAL_OINTMENT | Freq: Two times a day (BID) | CUTANEOUS | Status: DC

## 2017-05-19 MED ORDER — PROPOFOL 10 MG/ML IV BOLUS
INTRAVENOUS | Status: DC | PRN
  Administered 2017-05-19: 50 mg via INTRAVENOUS
  Administered 2017-05-19: 150 mg via INTRAVENOUS

## 2017-05-19 MED ORDER — MIDAZOLAM HCL 5 MG/5ML IJ SOLN
INTRAMUSCULAR | Status: DC | PRN
  Administered 2017-05-19: 2 mg via INTRAVENOUS

## 2017-05-19 SURGICAL SUPPLY — 62 items
BAG DECANTER FOR FLEXI CONT (MISCELLANEOUS) IMPLANT
BANDAGE ACE 4X5 VEL STRL LF (GAUZE/BANDAGES/DRESSINGS) IMPLANT
BARRIER SKIN 2 1/4 (WOUND CARE) ×3 IMPLANT
BARRIER SKIN 2 1/4INCH (WOUND CARE) ×1
BLADE SAGITTAL (BLADE) ×2
BLADE SAW THK.89X75X18XSGTL (BLADE) ×2 IMPLANT
BNDG GAUZE ELAST 4 BULKY (GAUZE/BANDAGES/DRESSINGS) IMPLANT
CANISTER SUCT 3000ML PPV (MISCELLANEOUS) ×4 IMPLANT
CANISTER WOUND CARE 500ML ATS (WOUND CARE) ×8 IMPLANT
COVER SURGICAL LIGHT HANDLE (MISCELLANEOUS) ×4 IMPLANT
DRAPE HALF SHEET 40X57 (DRAPES) IMPLANT
DRAPE IMP U-DRAPE 54X76 (DRAPES) ×4 IMPLANT
DRAPE INCISE IOBAN 66X45 STRL (DRAPES) IMPLANT
DRAPE ORTHO SPLIT 77X108 STRL (DRAPES) ×4
DRAPE SURG ORHT 6 SPLT 77X108 (DRAPES) ×4 IMPLANT
DRAPE U-SHAPE 47X51 STRL (DRAPES) ×8 IMPLANT
DRSG ADAPTIC 3X8 NADH LF (GAUZE/BANDAGES/DRESSINGS) ×4 IMPLANT
DRSG PAD ABDOMINAL 8X10 ST (GAUZE/BANDAGES/DRESSINGS) IMPLANT
DRSG VAC ATS LRG SENSATRAC (GAUZE/BANDAGES/DRESSINGS) ×8 IMPLANT
DRSG VAC ATS MED SENSATRAC (GAUZE/BANDAGES/DRESSINGS) IMPLANT
DRSG VAC ATS SM SENSATRAC (GAUZE/BANDAGES/DRESSINGS) IMPLANT
DURAPREP 26ML APPLICATOR (WOUND CARE) IMPLANT
ELECT CAUTERY BLADE 6.4 (BLADE) ×4 IMPLANT
ELECT REM PT RETURN 9FT ADLT (ELECTROSURGICAL) ×4
ELECTRODE REM PT RTRN 9FT ADLT (ELECTROSURGICAL) ×2 IMPLANT
GAUZE SPONGE 4X4 12PLY STRL (GAUZE/BANDAGES/DRESSINGS) ×4 IMPLANT
GAUZE XEROFORM 5X9 LF (GAUZE/BANDAGES/DRESSINGS) ×4 IMPLANT
GLOVE BIO SURGEON STRL SZ7.5 (GLOVE) ×4 IMPLANT
GLOVE BIO SURGEON STRL SZ8 (GLOVE) ×4 IMPLANT
GLOVE BIOGEL PI IND STRL 8 (GLOVE) ×4 IMPLANT
GLOVE BIOGEL PI INDICATOR 8 (GLOVE) ×4
GLOVE ORTHO TXT STRL SZ7.5 (GLOVE) ×4 IMPLANT
GOWN STRL REUS W/ TWL LRG LVL3 (GOWN DISPOSABLE) ×2 IMPLANT
GOWN STRL REUS W/ TWL XL LVL3 (GOWN DISPOSABLE) ×8 IMPLANT
GOWN STRL REUS W/TWL LRG LVL3 (GOWN DISPOSABLE) ×2
GOWN STRL REUS W/TWL XL LVL3 (GOWN DISPOSABLE) ×8
HANDPIECE INTERPULSE COAX TIP (DISPOSABLE) ×2
KIT BASIN OR (CUSTOM PROCEDURE TRAY) ×4 IMPLANT
KIT ROOM TURNOVER OR (KITS) ×4 IMPLANT
MANIFOLD NEPTUNE II (INSTRUMENTS) ×4 IMPLANT
NS IRRIG 1000ML POUR BTL (IV SOLUTION) ×4 IMPLANT
PACK ORTHO EXTREMITY (CUSTOM PROCEDURE TRAY) ×4 IMPLANT
PACK TOTAL JOINT (CUSTOM PROCEDURE TRAY) IMPLANT
PACK UNIVERSAL I (CUSTOM PROCEDURE TRAY) ×4 IMPLANT
PAD ARMBOARD 7.5X6 YLW CONV (MISCELLANEOUS) ×8 IMPLANT
SET HNDPC FAN SPRY TIP SCT (DISPOSABLE) ×2 IMPLANT
SPONGE LAP 18X18 X RAY DECT (DISPOSABLE) ×8 IMPLANT
STAPLER VISISTAT 35W (STAPLE) IMPLANT
SUT ETHILON 2 0 FS 18 (SUTURE) ×4 IMPLANT
SUT ETHILON 2 0 PSLX (SUTURE) ×16 IMPLANT
SUT VIC AB 1 CTB1 27 (SUTURE) ×8 IMPLANT
SUT VIC AB 2-0 CT1 27 (SUTURE) ×4
SUT VIC AB 2-0 CT1 TAPERPNT 27 (SUTURE) ×4 IMPLANT
SWAB CULTURE ESWAB REG 1ML (MISCELLANEOUS) IMPLANT
TOWEL OR 17X24 6PK STRL BLUE (TOWEL DISPOSABLE) ×4 IMPLANT
TOWEL OR 17X26 10 PK STRL BLUE (TOWEL DISPOSABLE) ×4 IMPLANT
TUBE CONNECTING 12'X1/4 (SUCTIONS) ×1
TUBE CONNECTING 12X1/4 (SUCTIONS) ×3 IMPLANT
UNDERPAD 30X30 (UNDERPADS AND DIAPERS) ×4 IMPLANT
WATER STERILE IRR 1000ML POUR (IV SOLUTION) ×4 IMPLANT
WND VAC CANISTER 500ML (MISCELLANEOUS) ×4 IMPLANT
YANKAUER SUCT BULB TIP NO VENT (SUCTIONS) ×4 IMPLANT

## 2017-05-19 NOTE — Progress Notes (Signed)
Seal checked - no suction registering on machine- suction noted when wound vac disconnected from patient and- will follow up with Dr Ninfa Linden

## 2017-05-19 NOTE — Anesthesia Procedure Notes (Signed)
Procedure Name: LMA Insertion Date/Time: 05/19/2017 9:53 AM Performed by: Scheryl Darter Pre-anesthesia Checklist: Patient identified, Emergency Drugs available, Suction available and Patient being monitored Patient Re-evaluated:Patient Re-evaluated prior to inductionOxygen Delivery Method: Circle System Utilized Preoxygenation: Pre-oxygenation with 100% oxygen Intubation Type: IV induction Ventilation: Mask ventilation without difficulty LMA: LMA inserted LMA Size: 5.0 Number of attempts: 1 Placement Confirmation: positive ETCO2 Tube secured with: Tape Dental Injury: Teeth and Oropharynx as per pre-operative assessment

## 2017-05-19 NOTE — Anesthesia Postprocedure Evaluation (Signed)
Anesthesia Post Note  Patient: CLEMENT DENEAULT  Procedure(s) Performed: Procedure(s) (LRB): REPEAT IRRIGATION AND DEBRIDEMENT HIP (Left) WOUND VAC CHANGE (Left)     Patient location during evaluation: PACU Anesthesia Type: General Level of consciousness: awake and alert Pain management: pain level controlled Vital Signs Assessment: post-procedure vital signs reviewed and stable Respiratory status: spontaneous breathing, nonlabored ventilation and respiratory function stable Cardiovascular status: blood pressure returned to baseline and stable Postop Assessment: no signs of nausea or vomiting Anesthetic complications: no    Last Vitals:  Vitals:   05/19/17 1129 05/19/17 1144  BP: 124/75 (!) 141/91  Pulse: (!) 54 (!) 53  Resp: (!) 8 (!) 9  Temp:      Last Pain:  Vitals:   05/19/17 1059  TempSrc:   PainSc: 0-No pain                 Aquila Delaughter,W. EDMOND

## 2017-05-19 NOTE — Transfer of Care (Signed)
Immediate Anesthesia Transfer of Care Note  Patient: Sean Horton  Procedure(s) Performed: Procedure(s): REPEAT IRRIGATION AND DEBRIDEMENT HIP (Left) WOUND VAC CHANGE (Left)  Patient Location: PACU  Anesthesia Type:General  Level of Consciousness: awake, alert , oriented and sedated  Airway & Oxygen Therapy: Patient Spontanous Breathing and Patient connected to nasal cannula oxygen  Post-op Assessment: Report given to RN, Post -op Vital signs reviewed and stable and Patient moving all extremities  Post vital signs: Reviewed and stable  Last Vitals:  Vitals:   05/19/17 0429 05/19/17 0451  BP: (!) 94/52 103/65  Pulse: 65 65  Resp: 19 18  Temp: 36.6 C 36.9 C    Last Pain:  Vitals:   05/19/17 0832  TempSrc:   PainSc: 2       Patients Stated Pain Goal: 3 (77/41/42 3953)  Complications: No apparent anesthesia complications

## 2017-05-19 NOTE — Brief Op Note (Signed)
05/15/2017 - 05/19/2017  10:46 AM  PATIENT:  Sean Horton  31 y.o. male  PRE-OPERATIVE DIAGNOSIS:  Infected left hip  POST-OPERATIVE DIAGNOSIS:  Infected left hip  PROCEDURE:  Procedure(s): REPEAT IRRIGATION AND DEBRIDEMENT HIP (Left) WOUND VAC CHANGE (Left)  SURGEON:  Surgeon(s) and Role:    Mcarthur Rossetti, MD - Primary  PHYSICIAN ASSISTANT: Benita Stabile, PA-C  ANESTHESIA:   general  EBL:  Total I/O In: 700 [I.V.:700] Out: 400 [Urine:300; Blood:100]  COUNTS:  YES  PLAN OF CARE: Admit to inpatient   PATIENT DISPOSITION:  PACU - hemodynamically stable.   Delay start of Pharmacological VTE agent (>24hrs) due to surgical blood loss or risk of bleeding: no

## 2017-05-19 NOTE — Anesthesia Preprocedure Evaluation (Addendum)
Anesthesia Evaluation  Patient identified by MRN, date of birth, ID band Patient awake    Reviewed: Allergy & Precautions, H&P , NPO status , Patient's Chart, lab work & pertinent test results  Airway Mallampati: III  TM Distance: >3 FB Neck ROM: Full    Dental no notable dental hx. (+) Teeth Intact, Dental Advisory Given   Pulmonary Current Smoker,    Pulmonary exam normal breath sounds clear to auscultation       Cardiovascular negative cardio ROS   Rhythm:Regular Rate:Normal     Neuro/Psych  Headaches, T3 paraplegic negative psych ROS   GI/Hepatic Neg liver ROS, GERD  Medicated and Controlled,  Endo/Other  negative endocrine ROS  Renal/GU negative Renal ROS  negative genitourinary   Musculoskeletal   Abdominal   Peds  Hematology negative hematology ROS (+)   Anesthesia Other Findings   Reproductive/Obstetrics negative OB ROS                            Anesthesia Physical Anesthesia Plan  ASA: III  Anesthesia Plan: General   Post-op Pain Management:    Induction: Intravenous  PONV Risk Score and Plan: 1 and Ondansetron, Dexamethasone, Propofol and Midazolam  Airway Management Planned: LMA  Additional Equipment:   Intra-op Plan:   Post-operative Plan: Extubation in OR  Informed Consent: I have reviewed the patients History and Physical, chart, labs and discussed the procedure including the risks, benefits and alternatives for the proposed anesthesia with the patient or authorized representative who has indicated his/her understanding and acceptance.   Dental advisory given  Plan Discussed with: CRNA  Anesthesia Plan Comments: (DNR: No defibrillation or CPR. Drugs OK.)       Anesthesia Quick Evaluation

## 2017-05-19 NOTE — Progress Notes (Signed)
Family Medicine Teaching Service Daily Progress Note Intern Pager: [phone]  Patient name: Sean Horton Medical record number: [id number] Date of birth: [birth year] Age: 31 y.o. Gender: male  Primary Care Provider: [name], DO Consultants: orthopedics, ID, plastic surgery Code Status: full  Pt Overview and Major Events to Date:  6/19- admitted to [facility] 6/20- irrigation and debridement of left hip, wound vac  Assessment and Plan: Sean Horton is a 31 y.o. male presenting with L hip infection. PMH is significant for paraplegia with neurogenic bladder and chronic indwelling foley.  Sepsis likely 2/2 L hip infection- VSS, afebrile. WBC trend 19.3 > 15.4 > 12.9. Plastic surgery consulted and will see patient outpatient after he begins to heal.  - vital signs per floor  - monitor on telemetry - ID following, recommend treating with empiric zosyn, will need 4 weeks of antibiotics - blood cultures no growth x 3 days - Wound care continuing to follow - Orthopedics following. Back to OR today for repeat I&D and wound vac change  Recent UTI tx with Macrobid- resolved. Repeat curine culture no growth.  - continue oxybutynin - continue zosyn for infection as above   Anemia. On admission Hgb 8.0 > 6.7 > 6.3. S/p 2 units PRBC, post-transfusion H/H 9.9 > 7.3>7.6>7.8. Received B12 supplementation.   - Monitor CBC closely  Chronic nerve pain 2/2 paraplegia. At home on baclofen, cymbalta, lyrica. Patient has had some worsening of his nerve pain with acute infection but is maxed out on those home medications. Also on oxycodone ER 27mg  q12h and prn percocet 10-325mg  q8prn. - continue home meds - prn dilaudid 1 mg q2H for severe, breakthrough pain  GERD- takes omeprazole at home - continue ppi  FEN/GI: regular diet Prophylaxis: lovenox  Disposition: pending clinical improvement  Subjective:  Patient reporting continued but not worsened hip pain today. Has no other complaints,  and says generally feels well otherwise. Ortho to take to OR this AM for another washout.  Objective: Temp:  [97.7 F (36.5 C)-98.6 F (37 C)] 98.5 F (36.9 C) (06/23 0451) Pulse Rate:  [54-87] 65 (06/23 0451) Resp:  [18-19] 18 (06/23 0451) BP: (94-137)/(50-84) 103/65 (06/23 0451) SpO2:  [97 %-99 %] 99 % (06/23 0451) Physical Exam: General: pleasant man lying in bed in NAD Cardiovascular: RRR no MRG Respiratory: CTAB no increased WOB on RA Abdomen: soft NTND, +BS  Laboratory:  Recent Labs Lab 05/17/17 1514 05/18/17 0732 05/18/17 1220  WBC 14.3* 13.6* 13.5*  HGB 7.3* 7.6* 7.8*  HCT 25.1* 26.9* 27.3*  PLT 586* 635* 637*    Recent Labs Lab 05/15/17 1430 05/16/17 0724 05/16/17 1632 05/18/17 0732  NA 139 138 143 139  K 4.3 4.0 4.4 4.4  CL 104 108  --  109  CO2 25 24  --  22  BUN 18 13  --  12  CREATININE 0.90 0.84  --  0.88  CALCIUM 9.3 9.0  --  8.8*  PROT 9.7*  --   --   --   BILITOT 0.6  --   --   --   ALKPHOS 141*  --   --   --   ALT 14*  --   --   --   AST 17  --   --   --   GLUCOSE 198* 100* 107* 144*   LA 1.35 HIV NR B12 low at 136 Folate 17.6 Iron Low 14, TIBC normal 252, Sat low 6 Ferritin normal 94  Imaging/Diagnostic Tests:  No results found. Verner Mould, MD 05/19/2017, 6:39 AM PGY-2, [place] Intern pager: [phone], text pages welcome

## 2017-05-20 ENCOUNTER — Encounter (HOSPITAL_COMMUNITY): Payer: Self-pay | Admitting: Orthopaedic Surgery

## 2017-05-20 DIAGNOSIS — D508 Other iron deficiency anemias: Secondary | ICD-10-CM

## 2017-05-20 LAB — CULTURE, BLOOD (ROUTINE X 2)
Culture: NO GROWTH
Culture: NO GROWTH
SPECIAL REQUESTS: ADEQUATE
SPECIAL REQUESTS: ADEQUATE

## 2017-05-20 LAB — CBC
HCT: 23.5 % — ABNORMAL LOW (ref 39.0–52.0)
Hemoglobin: 6.8 g/dL — CL (ref 13.0–17.0)
MCH: 20.2 pg — ABNORMAL LOW (ref 26.0–34.0)
MCHC: 28.9 g/dL — AB (ref 30.0–36.0)
MCV: 69.7 fL — ABNORMAL LOW (ref 78.0–100.0)
PLATELETS: 580 10*3/uL — AB (ref 150–400)
RBC: 3.37 MIL/uL — ABNORMAL LOW (ref 4.22–5.81)
RDW: 24.3 % — AB (ref 11.5–15.5)
WBC: 13.2 10*3/uL — AB (ref 4.0–10.5)

## 2017-05-20 LAB — HEMOGLOBIN AND HEMATOCRIT, BLOOD
HCT: 27.7 % — ABNORMAL LOW (ref 39.0–52.0)
HEMOGLOBIN: 7.9 g/dL — AB (ref 13.0–17.0)

## 2017-05-20 LAB — BASIC METABOLIC PANEL
ANION GAP: 8 (ref 5–15)
BUN: 15 mg/dL (ref 6–20)
CALCIUM: 8.5 mg/dL — AB (ref 8.9–10.3)
CO2: 25 mmol/L (ref 22–32)
CREATININE: 1.06 mg/dL (ref 0.61–1.24)
Chloride: 107 mmol/L (ref 101–111)
Glucose, Bld: 118 mg/dL — ABNORMAL HIGH (ref 65–99)
Potassium: 4.5 mmol/L (ref 3.5–5.1)
SODIUM: 140 mmol/L (ref 135–145)

## 2017-05-20 LAB — PREPARE RBC (CROSSMATCH)

## 2017-05-20 MED ORDER — OXYCODONE HCL 5 MG PO TABS: 5.0000 mg | ORAL_TABLET | Freq: Four times a day (QID) | ORAL | Status: DC | PRN

## 2017-05-20 MED ORDER — IBUPROFEN 400 MG PO TABS
400.0000 mg | ORAL_TABLET | Freq: Four times a day (QID) | ORAL | Status: DC
  Administered 2017-05-20 – 2017-05-21 (×5): 400 mg via ORAL
  Filled 2017-05-20 (×5): qty 1

## 2017-05-20 MED ORDER — ACETAMINOPHEN 325 MG PO TABS
650.0000 mg | ORAL_TABLET | Freq: Four times a day (QID) | ORAL | Status: DC
  Administered 2017-05-20 – 2017-05-21 (×5): 650 mg via ORAL
  Filled 2017-05-20 (×5): qty 2

## 2017-05-20 MED ORDER — SODIUM CHLORIDE 0.9 % IV SOLN
Freq: Once | INTRAVENOUS | Status: AC
  Administered 2017-05-20: 12:00:00 via INTRAVENOUS

## 2017-05-20 NOTE — Progress Notes (Signed)
Patient ID: Sean Horton, male   DOB: 12-15-1985, 31 y.o.   MRN: [id number] No acute changes.  Tolerated surgery well yesterday. Repeat I&D showed no gross purulence nor necrotic tissue.  I did remove some proximal femur to take some pressure off of the soft tissue.  A new VAC was applied.  At this point, there is nothing else from an Ortho standpoint that I can provide for this chronic wound.  Further care can be deferred to Plastics here or [place] can be changed every 3 days at home vs here in the hospital by the wound care nurses pending further Plastic follow-up.  If Plastics here would like to see the wound, the VAC can be removed by nursing and a wet-to-dry dressing placed.  I can also come to the bedside to do this if there is reluctance and the place a new VAC at the bedside if needed.

## 2017-05-20 NOTE — Progress Notes (Signed)
MD notified of Hgb 6.8. Stated he would look into giving patient a blood transfusion.

## 2017-05-20 NOTE — Progress Notes (Signed)
Family Medicine Teaching Service Daily Progress Note Intern Pager: [phone]  Patient name: STEPFON Horton Medical record number: [id number] Date of birth: 04-15-1986 Age: 31 y.o. Gender: male  Primary Care Provider: [name], DO Consultants: orthopedics, ID, plastic surgery Code Status: full  Pt Overview and Major Events to Date:  6/19- admitted to [facility] 6/20- irrigation and debridement of left hip, wound vac 6/23- irrigation and debridement left hip with replacement of wound vac  Assessment and Plan: Sean Horton is a 31 y.o. male presenting with L hip infection. PMH is significant for paraplegia with neurogenic bladder and chronic indwelling foley.  Sepsis likely 2/2 L hip infection- Sepsis resolved. VSS, afebrile. WBC 13.2 today - vital signs per floor  - monitor on telemetry - ID following, recommend 4 weeks of outpatient antibiotics doxy 100 BID, cipro 500 BID, flagyl 500 TID - blood cultures no growth x 4 days - Wound care continuing to follow - Orthopedics recs- change vac at bedside vs home q3-4 days - plastic surgery consulted and will see patient as outpatient for skin flap, wound healing  Recent UTI tx with Macrobid- resolved. Repeat curine culture no growth.  - continue oxybutynin - continue zosyn for infection as above   Anemia- persistent. On admission Hgb 8.0 > 6.7 > 6.3. S/p 2 units PRBC, post-transfusion H/H 9.9 > 7.3>7.6>7.8. Received B12 supplementation. This am Hgb 6.8 - transfuse additional 1 unit of PRBC - Monitor CBC closely  Chronic nerve pain 2/2 paraplegia. At home on baclofen, cymbalta, lyrica. Patient has had some worsening of his nerve pain with acute infection but is maxed out on those home medications. Also on oxycodone ER 27mg  q12h and prn percocet 10-325mg  q8prn. - continue home meds - prn dilaudid 1 mg q2H for severe, breakthrough pain - schedule tylenol and ibuprofen - oxy 5 IR q6 prn  GERD- takes omeprazole at home - continue  ppi  FEN/GI: regular diet Prophylaxis: lovenox  Disposition: pending clinical improvement  Subjective:  Has continued uncontrolled pain, dilaudid helps but does not last full 2 hours. Worried about plastic surgery in future as he does not want to go to SNF for 6 weeks.  Objective: Temp:  [97.4 F (36.3 C)-98.7 F (37.1 C)] 97.5 F (36.4 C) (06/24 0634) Pulse Rate:  [51-79] 79 (06/24 0634) Resp:  [6-16] 16 (06/24 0634) BP: (103-141)/(48-91) 103/48 (06/24 0634) SpO2:  [96 %-100 %] 98 % (06/24 7035) Physical Exam: General: pleasant man lying in bed in NAD Cardiovascular: RRR no MRG Respiratory: CTAB no increased WOB on RA Abdomen: soft NTND, +BS Extremities: wound vac in place over left hip draining bloody fluid  Laboratory:  Recent Labs Lab 05/18/17 1220 05/19/17 0532 05/20/17 0447  WBC 13.5* 12.1* 13.2*  HGB 7.8* 7.6* 6.8*  HCT 27.3* 26.7* 23.5*  PLT 637* 591* 580*    Recent Labs Lab 05/15/17 1430  05/18/17 0732 05/19/17 0532 05/20/17 0447  NA 139  < > 139 140 140  K 4.3  < > 4.4 4.5 4.5  CL 104  < > 109 106 107  CO2 25  < > 22 25 25   BUN 18  < > 12 14 15   CREATININE 0.90  < > 0.88 0.89 1.06  CALCIUM 9.3  < > 8.8* 8.9 8.5*  PROT 9.7*  --   --   --   --   BILITOT 0.6  --   --   --   --   ALKPHOS 141*  --   --   --   --  ALT 14*  --   --   --   --   AST 17  --   --   --   --   GLUCOSE 198*  < > 144* 84 118*  < > = values in this interval not displayed. LA 1.35 HIV NR B12 low at 136 Folate 17.6 Iron Low 14, TIBC normal 252, Sat low 6 Ferritin normal 94  Imaging/Diagnostic Tests: No results found. [name], DO 05/20/2017, 10:47 AM PGY-1, [place] Intern pager: [phone], text pages welcome

## 2017-05-21 DIAGNOSIS — D649 Anemia, unspecified: Secondary | ICD-10-CM

## 2017-05-21 LAB — CBC
HEMATOCRIT: 28 % — AB (ref 39.0–52.0)
HEMOGLOBIN: 7.8 g/dL — AB (ref 13.0–17.0)
MCH: 20.2 pg — AB (ref 26.0–34.0)
MCHC: 27.9 g/dL — AB (ref 30.0–36.0)
MCV: 72.5 fL — ABNORMAL LOW (ref 78.0–100.0)
Platelets: 567 10*3/uL — ABNORMAL HIGH (ref 150–400)
RBC: 3.86 MIL/uL — ABNORMAL LOW (ref 4.22–5.81)
RDW: 25.1 % — ABNORMAL HIGH (ref 11.5–15.5)
WBC: 9.4 10*3/uL (ref 4.0–10.5)

## 2017-05-21 LAB — BASIC METABOLIC PANEL
Anion gap: 6 (ref 5–15)
BUN: 19 mg/dL (ref 6–20)
CHLORIDE: 108 mmol/L (ref 101–111)
CO2: 26 mmol/L (ref 22–32)
CREATININE: 1.02 mg/dL (ref 0.61–1.24)
Calcium: 8.9 mg/dL (ref 8.9–10.3)
GFR calc Af Amer: >60 mL/min (ref >60)
GFR calc non Af Amer: >60 mL/min (ref >60)
Glucose, Bld: 98 mg/dL (ref 65–99)
Potassium: 4.5 mmol/L (ref 3.5–5.1)
Sodium: 140 mmol/L (ref 135–145)

## 2017-05-21 LAB — BPAM RBC
BLOOD PRODUCT EXPIRATION DATE: 201807182359
ISSUE DATE / TIME: 201806241110
Unit Type and Rh: 5100

## 2017-05-21 LAB — TYPE AND SCREEN
ABO/RH(D): O POS
Antibody Screen: NEGATIVE
Unit division: 0

## 2017-05-21 MED ORDER — CYANOCOBALAMIN 1000 MCG PO TABS: 1000.0000 ug | ORAL_TABLET | Freq: Every day | ORAL | 3 refills | Status: DC

## 2017-05-21 MED ORDER — METRONIDAZOLE 500 MG PO TABS: 500.0000 mg | ORAL_TABLET | Freq: Three times a day (TID) | ORAL | 0 refills | Status: AC

## 2017-05-21 MED ORDER — CIPROFLOXACIN HCL 500 MG PO TABS: 500.0000 mg | ORAL_TABLET | Freq: Two times a day (BID) | ORAL | 0 refills | Status: AC

## 2017-05-21 MED ORDER — DOXYCYCLINE HYCLATE 100 MG PO TABS
100.0000 mg | ORAL_TABLET | Freq: Two times a day (BID) | ORAL | Status: DC
  Administered 2017-05-21: 100 mg via ORAL
  Filled 2017-05-21: qty 1

## 2017-05-21 MED ORDER — ACETAMINOPHEN 325 MG PO TABS: 650.0000 mg | ORAL_TABLET | Freq: Four times a day (QID) | ORAL | 3 refills | Status: DC

## 2017-05-21 MED ORDER — METRONIDAZOLE 500 MG PO TABS
500.0000 mg | ORAL_TABLET | Freq: Three times a day (TID) | ORAL | Status: DC
  Administered 2017-05-21 (×2): 500 mg via ORAL
  Filled 2017-05-21 (×2): qty 1

## 2017-05-21 MED ORDER — CIPROFLOXACIN HCL 500 MG PO TABS
500.0000 mg | ORAL_TABLET | Freq: Two times a day (BID) | ORAL | Status: DC
  Administered 2017-05-21: 500 mg via ORAL
  Filled 2017-05-21: qty 1

## 2017-05-21 MED ORDER — OXYCODONE HCL 5 MG PO TABS: 5.0000 mg | ORAL_TABLET | Freq: Four times a day (QID) | ORAL | 0 refills | Status: DC | PRN

## 2017-05-21 MED ORDER — POLYETHYLENE GLYCOL 3350 17 G PO PACK: 17.0000 g | PACK | Freq: Every day | ORAL | 2 refills | Status: DC | PRN

## 2017-05-21 MED ORDER — FERROUS SULFATE 325 (65 FE) MG PO TBEC: 325.0000 mg | DELAYED_RELEASE_TABLET | Freq: Two times a day (BID) | ORAL | 3 refills | Status: DC

## 2017-05-21 MED ORDER — DOXYCYCLINE HYCLATE 100 MG PO TABS: 100.0000 mg | ORAL_TABLET | Freq: Two times a day (BID) | ORAL | 0 refills | Status: AC

## 2017-05-21 MED ORDER — CHLORHEXIDINE GLUCONATE CLOTH 2 % EX PADS: 6.0000 | MEDICATED_PAD | Freq: Every day | CUTANEOUS | 12 refills | Status: DC

## 2017-05-21 MED ORDER — SODIUM CHLORIDE 0.9 % IV SOLN
510.0000 mg | Freq: Once | INTRAVENOUS | Status: AC
  Administered 2017-05-21: 510 mg via INTRAVENOUS
  Filled 2017-05-21: qty 17

## 2017-05-21 NOTE — Progress Notes (Signed)
Discharge paperwork given to patient. Prescription given. No questions verbalized.

## 2017-05-21 NOTE — Care Management Note (Addendum)
Case Management Note  Patient Details  Name: Sean Horton MRN: [id number] Date of Birth: 12/03/1985  Subjective/Objective:                    Action/Plan: 20 [employer] VAC was released and will arrive to patient's hospital room in 4 to 5 hours . Patient and bedside nurse aware. [facility] aware discharge is today and is following.  VAC application faxed to [facility]. Plan patient will discharge today to home on PO ABX and [employer] VAC. Patient aware voices understanding. Patient's mother plans on transporting patient home.  Expected Discharge Date:                  Expected Discharge Plan:  [place]  In-House Referral:     Discharge planning Services  CM Consult  Post Acute Care Choice:  Home Health Choice offered to:  Patient  DME Arranged:  Vac DME Agency:  [facility]  HH Arranged:  RN [place] Agency:  [facility]  Status of Service:  In process, will continue to follow  If discussed at Long Length of Stay Meetings, dates discussed:    Additional Comments:  Marilu Favre, RN 05/21/2017, 10:50 AM

## 2017-05-21 NOTE — Op Note (Signed)
NAMEBRIX, BREARLEY NO.:  [id number]  MEDICAL RECORD NO.:  [id number]  LOCATION:                                 FACILITY:  PHYSICIAN:  Lind Guest. Ninfa Linden, M.D.DATE OF BIRTH:  01-Aug-1986  DATE OF PROCEDURE:  05/19/2017 DATE OF DISCHARGE:                              OPERATIVE REPORT   PREOPERATIVE DIAGNOSIS:  Chronic left hip wound, status post multiple surgeries and status post recent irrigation and debridement.  POSTOPERATIVE DIAGNOSIS:  Chronic left hip wound, status post multiple surgeries and status post recent irrigation and debridement.  PROCEDURE:  Repeat irrigation debridement of left hip wound including removal of some sclerotic greater trochanter and placement of a decub VAC wound sponge.  FINDINGS:  No gross purulence, no necrotic tissue, no evidence of osteomyelitis and only sclerotic proximal femur.  SURGEON:  Lind Guest. Ninfa Linden, M.D.  ASSISTANT:  Erskine Emery, PA-C.  ANESTHESIA:  General.  BLOOD LOSS:  100 mL.  COMPLICATIONS:  None.  INDICATIONS:  Mr. Chhim is returning to the operating room today for repeat irrigation and debridement and VAC change of his left hip wound. This has been a chronic issue for him and he underwent an irrigation and debridement due to gross purulence 3 days ago.  He understands the reason behind proceeding with surgery and does wish to proceed.  PROCEDURE DESCRIPTION:  After informed consent was obtained, appropriate left hip was marked.  He was brought to the operating room, placed supine on the operating table.  General anesthesia was then obtained.  A nonsterile bump was placed under his left hip, and his left hip was prepped and draped with Betadine scrub and paint.  We then removed the VAC sponge and found no evidence of grossly purulent tissue or necrotic tissue at all in this setting.  It was nice and clean with good granulation tissue; however, there was exposed greater  trochanter. Using an oscillating saw and elevating osteotomes, I was able to remove some of the proximal femur to hopefully relieve some of the tension off his tissues that hopefully Plastic Surgery will be able to work with toward closing eventually; however, this is still a chronic wound.  I thoroughly irrigated the wound out with 3 L of normal saline solution using pulsatile lavage.  I was able to rearrange some of the skin tissue and closed it loosely over the bone, but there were still a deficit in the hip wound area.  I packed it deep with VAC sponge in the wound and brought it out over the incision.  We set it to suction and got a good seal with -125 mm suction.  He was awakened, extubated, and taken to recovery room in stable condition.  All final counts were correct. There were no complications noted.  Of note, there was nothing else from an orthopedic standpoint that I have to offer other than referral to Plastic Surgery for continued chronic wound management and hopefully eventual closure of the wound. Again, there was no evidence of infection at all at this standpoint. Obviously, we can have the VAC changes at the bedside every 3 days prior to any Plastic Surgery follow up.  Lind Guest. Ninfa Linden, M.D.   ______________________________ Lind Guest. Ninfa Linden, M.D.    CYB/MEDQ  D:  05/19/2017  T:  05/19/2017  Job:  [id number]

## 2017-05-21 NOTE — Progress Notes (Signed)
Family Medicine Teaching Service Daily Progress Note Intern Pager: [phone]  Patient name: Sean Horton Medical record number: [id number] Date of birth: 1986-03-26 Age: 31 y.o. Gender: male  Primary Care Provider: [name], DO Consultants: orthopedics, ID, plastic surgery Code Status: full  Pt Overview and Major Events to Date:  6/19- admitted to [facility] 6/20- irrigation and debridement of left hip, wound vac 6/23- irrigation and debridement left hip with replacement of wound vac  Assessment and Plan: Sean Horton is a 31 y.o. male presenting with L hip infection. PMH is significant for paraplegia with neurogenic bladder and chronic indwelling foley.  Sepsis likely 2/2 L hip infection- Sepsis resolved. VSS, afebrile. WBC 13.2 today - vital signs per floor  - monitor on telemetry - ID following, recommend 4 weeks of outpatient antibiotics doxy 100 BID, cipro 500 BID, flagyl 500 TID - blood cultures no growth x 4 days - Wound care continuing to follow - Orthopedics recs- change vac at bedside vs home q3-4 days - plastic surgery consulted and will see patient as outpatient for skin flap, wound healing  Neurogenic Bladder -continue oxybutynin  Protein Calorie Malnutrition -nutrition c/s  Anemia- persistent. On admission Hgb 8.0 > 6.7 > 6.3. S/p 2 units PRBC, post-transfusion H/H 9.9 > 7.3>7.6>7.8. Received B12 supplementation. Hgb 6.8 > 1 unit PRBC > 7.9 >7.8  - continue to monitor CBC - will need iron supplementation, timing difficult due to acute hip infection - Monitor CBC   Chronic nerve pain 2/2 paraplegia. At home on baclofen, cymbalta, lyrica. Patient has had some worsening of his nerve pain with acute infection but is maxed out on those home medications. Also on oxycodone ER 27mg  q12h and prn percocet 10-325mg  q8prn. - continue home meds - prn dilaudid 1 mg q2H for severe, breakthrough pain - schedule tylenol and ibuprofen - oxy 5 IR q6 prn  GERD- takes  omeprazole at home - continue ppi  FEN/GI: regular diet Prophylaxis: lovenox  Disposition: pending clinical improvement  Subjective:  Pain continues. No other concerns or complaints. Hopeful to go home. Worried wound will not heal.   Objective: Temp:  [97.5 F (36.4 C)-97.7 F (36.5 C)] 97.5 F (36.4 C) (06/25 0626) Pulse Rate:  [59-104] 92 (06/25 0626) Resp:  [15-17] 17 (06/25 0626) BP: (96-120)/(53-69) 103/58 (06/25 0626) SpO2:  [98 %-100 %] 98 % (06/25 0626) Physical Exam: General: pleasant man lying in bed in NAD Cardiovascular: RRR no MRG Respiratory: CTAB no increased WOB on RA Abdomen: soft NTND, +BS Extremities: wound vac in place over left hip draining bloody fluid  Laboratory:  Recent Labs Lab 05/19/17 0532 05/20/17 0447 05/20/17 2028 05/21/17 0613  WBC 12.1* 13.2*  --  9.4  HGB 7.6* 6.8* 7.9* 7.8*  HCT 26.7* 23.5* 27.7* 28.0*  PLT 591* 580*  --  567*    Recent Labs Lab 05/15/17 1430  05/19/17 0532 05/20/17 0447 05/21/17 0613  NA 139  < > 140 140 140  K 4.3  < > 4.5 4.5 4.5  CL 104  < > 106 107 108  CO2 25  < > 25 25 26   BUN 18  < > 14 15 19   CREATININE 0.90  < > 0.89 1.06 1.02  CALCIUM 9.3  < > 8.9 8.5* 8.9  PROT 9.7*  --   --   --   --   BILITOT 0.6  --   --   --   --   ALKPHOS 141*  --   --   --   --  ALT 14*  --   --   --   --   AST 17  --   --   --   --   GLUCOSE 198*  < > 84 118* 98  < > = values in this interval not displayed. LA 1.35 HIV NR B12 low at 136 Folate 17.6 Iron Low 14, TIBC normal 252, Sat low 6 Ferritin normal 94  Imaging/Diagnostic Tests: No results found. [name], DO 05/21/2017, 7:53 AM PGY-1, [place] Intern pager: [phone], text pages welcome

## 2017-05-23 ENCOUNTER — Telehealth: Payer: Self-pay | Admitting: Family Medicine

## 2017-05-23 NOTE — Telephone Encounter (Signed)
Pt declined HFU and states he will get appt with the wound center. Let him know to give [place] a call if any complications arise. - Mesha Guinyard

## 2017-06-05 ENCOUNTER — Telehealth: Payer: Self-pay

## 2017-06-05 NOTE — Telephone Encounter (Signed)
Verbal orders given to Donita at [facility]

## 2017-06-05 NOTE — Telephone Encounter (Signed)
Donita with [facility] called about wounds on pt ankles. Left ankle wants to change to hydroferablue, changing dressing 2 times a week. Rt ankle change the order to change the dressing 2 times a week. Would also like and order for a prealbumin level. Please give Donita a call at [phone]. Ottis Stain, CMA

## 2017-06-11 ENCOUNTER — Telehealth: Payer: Self-pay | Admitting: Family Medicine

## 2017-06-11 NOTE — Telephone Encounter (Signed)
Sean Horton wanted PCP that she went out Friday for a blood draw, stuck pt multiple times couldn't get anything. Sean Horton will be going back to see pt today and try again. ep

## 2017-06-11 NOTE — Telephone Encounter (Signed)
Just Fyi

## 2017-06-26 ENCOUNTER — Encounter (HOSPITAL_BASED_OUTPATIENT_CLINIC_OR_DEPARTMENT_OTHER): Payer: Medicaid Other | Attending: Surgery

## 2017-07-09 ENCOUNTER — Telehealth: Payer: Self-pay | Admitting: *Deleted

## 2017-07-09 NOTE — Telephone Encounter (Signed)
Verbal orders given for medi honey dressing twice weekly with foam dressing to ankle wound to Donita from [place], DO PGY-2, [place] [facility] 07/09/2017 3:37 PM

## 2017-07-09 NOTE — Telephone Encounter (Signed)
Donita, [facility] RN, states patient's ankle wound is scabbing over but is not healed underneath. Wound care nurse recommending medi honey twice weekly and cover with foam dressing. Please contact Donita with orders.  Hubbard Hartshorn, RN, BSN

## 2017-07-20 ENCOUNTER — Telehealth: Payer: Self-pay | Admitting: *Deleted

## 2017-07-20 NOTE — Telephone Encounter (Signed)
Sean Horton needs verbal orders to recertify patient for wound care.   This needs to be done every 6 months.  Please call her @ [phone]. [name], [name], CMA

## 2017-07-23 NOTE — Telephone Encounter (Signed)
Please give verbal orders to recertify wound care. Thank you. Lucila Maine, DO

## 2017-07-23 NOTE — Telephone Encounter (Signed)
Verbal orders given to [facility] to recertify wound care.

## 2017-08-15 ENCOUNTER — Encounter (HOSPITAL_BASED_OUTPATIENT_CLINIC_OR_DEPARTMENT_OTHER): Payer: Medicaid Other | Attending: Surgery

## 2017-08-15 DIAGNOSIS — E440 Moderate protein-calorie malnutrition: Secondary | ICD-10-CM | POA: Diagnosis not present

## 2017-08-15 DIAGNOSIS — G8222 Paraplegia, incomplete: Secondary | ICD-10-CM | POA: Diagnosis not present

## 2017-08-15 DIAGNOSIS — F17210 Nicotine dependence, cigarettes, uncomplicated: Secondary | ICD-10-CM | POA: Diagnosis not present

## 2017-08-15 DIAGNOSIS — L89224 Pressure ulcer of left hip, stage 4: Secondary | ICD-10-CM | POA: Insufficient documentation

## 2017-08-15 DIAGNOSIS — G629 Polyneuropathy, unspecified: Secondary | ICD-10-CM | POA: Diagnosis not present

## 2017-08-21 ENCOUNTER — Encounter (INDEPENDENT_AMBULATORY_CARE_PROVIDER_SITE_OTHER): Payer: Self-pay

## 2017-08-26 ENCOUNTER — Other Ambulatory Visit: Payer: Self-pay | Admitting: Family Medicine

## 2017-08-27 ENCOUNTER — Other Ambulatory Visit: Payer: Self-pay | Admitting: Family Medicine

## 2017-08-28 ENCOUNTER — Telehealth (INDEPENDENT_AMBULATORY_CARE_PROVIDER_SITE_OTHER): Payer: Self-pay | Admitting: Orthopaedic Surgery

## 2017-08-28 NOTE — Telephone Encounter (Signed)
Colletta Maryland with [employer]) faxed over a medicaid form to cover the final billing for the [employer] WD Vac.  The form was faxed over 08/13/17. The number to contact Colletta Maryland is [phone]

## 2017-08-30 NOTE — Telephone Encounter (Signed)
I put this on your desk to fill out

## 2017-09-04 ENCOUNTER — Telehealth (INDEPENDENT_AMBULATORY_CARE_PROVIDER_SITE_OTHER): Payer: Self-pay | Admitting: Orthopaedic Surgery

## 2017-09-04 NOTE — Telephone Encounter (Signed)
I'm not sure I understand what this message is saying?

## 2017-09-04 NOTE — Telephone Encounter (Signed)
[facility] DMA/ request for prior approval  Need to a new form sent cant read   Ext [phone] Fax [phone]

## 2017-09-05 ENCOUNTER — Telehealth (INDEPENDENT_AMBULATORY_CARE_PROVIDER_SITE_OTHER): Payer: Self-pay

## 2017-09-05 NOTE — Telephone Encounter (Signed)
Colletta Maryland with [employer] stated that PA form needs to be re-faxed with all questions answered by doctor for renewal of wound vac for Medicaid.  Reference# is [id number].  CB# is [phone].  Fax# is [phone].  Please advise.   Thank You.

## 2017-09-06 ENCOUNTER — Telehealth: Payer: Self-pay | Admitting: Family Medicine

## 2017-09-06 NOTE — Telephone Encounter (Signed)
FMLA form dropped off for at front desk for completion.  Verified that patient section of form has been completed.  Last DOS with PCP was 05/09/17.  Placed form in [facility] team folder to be completed by clinical staff.  Carmina Miller

## 2017-09-06 NOTE — Telephone Encounter (Signed)
FMLA form completed and placed in Tamika's form box.

## 2017-09-06 NOTE — Telephone Encounter (Signed)
Left voice message for patient's mom that FMLA was faxed per request. Original copy placed in outgoing mail to home address. Forms also copied for scanning in patient's record.  Derl Barrow, RN

## 2017-09-06 NOTE — Telephone Encounter (Signed)
Clinical info completed on FMLA form.  Place form in Dr. Riccio's box for completion.  HARTSELL,  JAZMIN, CMA   

## 2017-09-17 NOTE — Telephone Encounter (Signed)
Faxed

## 2017-09-19 ENCOUNTER — Telehealth: Payer: Self-pay | Admitting: *Deleted

## 2017-09-19 NOTE — Telephone Encounter (Signed)
Sean Horton needs verbal orders to re certify patients for home health. Fleeger, [name], CMA

## 2017-09-20 NOTE — Telephone Encounter (Signed)
Verbal orders for home health give. Lucila Maine, DO PGY-2, [facility] 09/20/2017 11:01 AM

## 2017-09-26 ENCOUNTER — Telehealth: Payer: Self-pay | Admitting: *Deleted

## 2017-09-26 NOTE — Telephone Encounter (Signed)
Spoke with Sharee Pimple, RN with [facility] who was requesting wound care orders. States wound vac has been discontinued. Directed her to Dr. Jean Rosenthal who is following patient for wound care. Sharee Pimple will contact that office. Unfortunately, patient has not been following up with wound care clinic because he doesn't like that he has to sit there for 1.5 hours. Hubbard Hartshorn, RN, BSN

## 2017-09-27 NOTE — Telephone Encounter (Signed)
Sharee Pimple, RN with [facility] left message on nurse line requesting wound care orders now that wound vac has been d/c. She can be reached at [phone]. Hubbard Hartshorn, RN, BSN

## 2017-09-29 ENCOUNTER — Other Ambulatory Visit: Payer: Self-pay | Admitting: Family Medicine

## 2017-10-01 ENCOUNTER — Telehealth: Payer: Self-pay | Admitting: Family Medicine

## 2017-10-01 ENCOUNTER — Telehealth (INDEPENDENT_AMBULATORY_CARE_PROVIDER_SITE_OTHER): Payer: Self-pay | Admitting: Orthopaedic Surgery

## 2017-10-01 NOTE — Telephone Encounter (Signed)
Will forward to MD to place this order.  Jackilyn Umphlett,CMA  

## 2017-10-01 NOTE — Telephone Encounter (Signed)
Verbal orders given to Lena.   Lucila Maine, DO PGY-2, [facility] 10/01/2017 1:38 PM

## 2017-10-01 NOTE — Telephone Encounter (Signed)
Pt needs order for wound care. Home nurse who called is Sharee Pimple and can be reached at [phone].

## 2017-10-03 NOTE — Telephone Encounter (Signed)
This encounter was created in error - please disregard.

## 2017-10-25 ENCOUNTER — Telehealth: Payer: Self-pay | Admitting: Family Medicine

## 2017-10-25 NOTE — Telephone Encounter (Signed)
Will forward to MD. Jazmin Hartsell,CMA  

## 2017-10-25 NOTE — Telephone Encounter (Signed)
[facility]:  Pt is now able to do his own wound care. Needs verbal orders to reduce visits to one per week

## 2017-10-26 NOTE — Telephone Encounter (Signed)
Verbal orders to reduce HH to one time a week given to [facility], [place].  Lucila Maine, DO PGY-2, [facility] 10/26/2017 10:28 AM

## 2017-11-07 ENCOUNTER — Telehealth: Payer: Self-pay | Admitting: Family Medicine

## 2017-11-07 NOTE — Telephone Encounter (Signed)
Verbal orders given  

## 2017-11-07 NOTE — Telephone Encounter (Signed)
[facility]:  Needs to change orders to: hyrodgel to wound, normal saline, wet to dry dressing every other day. Verbal orders will be ok.  Can leave voicemail

## 2017-11-29 ENCOUNTER — Other Ambulatory Visit: Payer: Self-pay | Admitting: Family Medicine

## 2017-12-13 ENCOUNTER — Inpatient Hospital Stay (HOSPITAL_COMMUNITY)
Admission: EM | Admit: 2017-12-13 | Discharge: 2017-12-16 | DRG: 698 | Disposition: A | Discharge status: Home Health | Payer: Medicaid Other | Attending: Internal Medicine | Admitting: Internal Medicine

## 2017-12-13 ENCOUNTER — Encounter (HOSPITAL_COMMUNITY): Payer: Self-pay | Admitting: *Deleted

## 2017-12-13 ENCOUNTER — Other Ambulatory Visit: Payer: Self-pay

## 2017-12-13 DIAGNOSIS — N179 Acute kidney failure, unspecified: Secondary | ICD-10-CM | POA: Diagnosis present

## 2017-12-13 DIAGNOSIS — D649 Anemia, unspecified: Secondary | ICD-10-CM | POA: Diagnosis not present

## 2017-12-13 DIAGNOSIS — L089 Local infection of the skin and subcutaneous tissue, unspecified: Secondary | ICD-10-CM | POA: Diagnosis present

## 2017-12-13 DIAGNOSIS — L89223 Pressure ulcer of left hip, stage 3: Secondary | ICD-10-CM | POA: Diagnosis present

## 2017-12-13 DIAGNOSIS — F17290 Nicotine dependence, other tobacco product, uncomplicated: Secondary | ICD-10-CM | POA: Diagnosis present

## 2017-12-13 DIAGNOSIS — L98499 Non-pressure chronic ulcer of skin of other sites with unspecified severity: Secondary | ICD-10-CM | POA: Diagnosis not present

## 2017-12-13 DIAGNOSIS — D6489 Other specified anemias: Secondary | ICD-10-CM | POA: Diagnosis present

## 2017-12-13 DIAGNOSIS — Z978 Presence of other specified devices: Secondary | ICD-10-CM

## 2017-12-13 DIAGNOSIS — F329 Major depressive disorder, single episode, unspecified: Secondary | ICD-10-CM | POA: Diagnosis present

## 2017-12-13 DIAGNOSIS — G8220 Paraplegia, unspecified: Secondary | ICD-10-CM

## 2017-12-13 DIAGNOSIS — Y846 Urinary catheterization as the cause of abnormal reaction of the patient, or of later complication, without mention of misadventure at the time of the procedure: Secondary | ICD-10-CM | POA: Diagnosis present

## 2017-12-13 DIAGNOSIS — R509 Fever, unspecified: Secondary | ICD-10-CM | POA: Diagnosis present

## 2017-12-13 DIAGNOSIS — A4151 Sepsis due to Escherichia coli [E. coli]: Secondary | ICD-10-CM | POA: Diagnosis present

## 2017-12-13 DIAGNOSIS — G9050 Complex regional pain syndrome I, unspecified: Secondary | ICD-10-CM | POA: Diagnosis present

## 2017-12-13 DIAGNOSIS — T83518A Infection and inflammatory reaction due to other urinary catheter, initial encounter: Principal | ICD-10-CM | POA: Diagnosis present

## 2017-12-13 DIAGNOSIS — R7881 Bacteremia: Secondary | ICD-10-CM | POA: Diagnosis not present

## 2017-12-13 DIAGNOSIS — N390 Urinary tract infection, site not specified: Secondary | ICD-10-CM | POA: Diagnosis present

## 2017-12-13 DIAGNOSIS — L98492 Non-pressure chronic ulcer of skin of other sites with fat layer exposed: Secondary | ICD-10-CM | POA: Diagnosis not present

## 2017-12-13 DIAGNOSIS — D500 Iron deficiency anemia secondary to blood loss (chronic): Secondary | ICD-10-CM | POA: Diagnosis not present

## 2017-12-13 DIAGNOSIS — T148XXA Other injury of unspecified body region, initial encounter: Secondary | ICD-10-CM

## 2017-12-13 DIAGNOSIS — Z960 Presence of urogenital implants: Secondary | ICD-10-CM

## 2017-12-13 DIAGNOSIS — E860 Dehydration: Secondary | ICD-10-CM | POA: Diagnosis present

## 2017-12-13 DIAGNOSIS — N319 Neuromuscular dysfunction of bladder, unspecified: Secondary | ICD-10-CM | POA: Diagnosis present

## 2017-12-13 DIAGNOSIS — A419 Sepsis, unspecified organism: Secondary | ICD-10-CM | POA: Diagnosis present

## 2017-12-13 DIAGNOSIS — Z9289 Personal history of other medical treatment: Secondary | ICD-10-CM | POA: Diagnosis not present

## 2017-12-13 LAB — CBC WITH DIFFERENTIAL/PLATELET
BASOS ABS: 0 10*3/uL (ref 0.0–0.1)
Basophils Relative: 0 %
EOS ABS: 0 10*3/uL (ref 0.0–0.7)
Eosinophils Relative: 0 %
HEMATOCRIT: 27.7 % — AB (ref 39.0–52.0)
Hemoglobin: 8.6 g/dL — ABNORMAL LOW (ref 13.0–17.0)
LYMPHS ABS: 3 10*3/uL (ref 0.7–4.0)
LYMPHS PCT: 19 %
MCH: 20.8 pg — ABNORMAL LOW (ref 26.0–34.0)
MCHC: 31 g/dL (ref 30.0–36.0)
MCV: 66.9 fL — ABNORMAL LOW (ref 78.0–100.0)
MONOS PCT: 9 %
Monocytes Absolute: 1.4 10*3/uL — ABNORMAL HIGH (ref 0.1–1.0)
NEUTROS ABS: 11.6 10*3/uL — AB (ref 1.7–7.7)
Neutrophils Relative %: 72 %
Platelets: 411 10*3/uL — ABNORMAL HIGH (ref 150–400)
RBC: 4.14 MIL/uL — ABNORMAL LOW (ref 4.22–5.81)
RDW: 17.6 % — AB (ref 11.5–15.5)
WBC: 16 10*3/uL — AB (ref 4.0–10.5)

## 2017-12-13 LAB — URINALYSIS, ROUTINE W REFLEX MICROSCOPIC
Glucose, UA: NEGATIVE mg/dL
KETONES UR: 5 mg/dL — AB
NITRITE: POSITIVE — AB
PROTEIN: 100 mg/dL — AB
Specific Gravity, Urine: 1.027 (ref 1.005–1.030)
pH: 5 (ref 5.0–8.0)

## 2017-12-13 LAB — BASIC METABOLIC PANEL
ANION GAP: 9 (ref 5–15)
BUN: 35 mg/dL — ABNORMAL HIGH (ref 6–20)
CALCIUM: 9 mg/dL (ref 8.9–10.3)
CO2: 24 mmol/L (ref 22–32)
CREATININE: 1.41 mg/dL — AB (ref 0.61–1.24)
Chloride: 102 mmol/L (ref 101–111)
Glucose, Bld: 228 mg/dL — ABNORMAL HIGH (ref 65–99)
Potassium: 4.3 mmol/L (ref 3.5–5.1)
SODIUM: 135 mmol/L (ref 135–145)

## 2017-12-13 LAB — I-STAT CG4 LACTIC ACID, ED: LACTIC ACID, VENOUS: 1.92 mmol/L — AB (ref 0.5–1.9)

## 2017-12-13 LAB — LACTIC ACID, PLASMA: Lactic Acid, Venous: 1.5 mmol/L (ref 0.5–1.9)

## 2017-12-13 MED ORDER — ACETAMINOPHEN 325 MG PO TABS
650.0000 mg | ORAL_TABLET | Freq: Four times a day (QID) | ORAL | Status: DC
  Administered 2017-12-13 – 2017-12-16 (×9): 650 mg via ORAL
  Filled 2017-12-13 (×10): qty 2

## 2017-12-13 MED ORDER — VITAMIN B-12 1000 MCG PO TABS
1000.0000 ug | ORAL_TABLET | Freq: Every day | ORAL | Status: DC
  Administered 2017-12-14 – 2017-12-16 (×3): 1000 ug via ORAL
  Filled 2017-12-13 (×3): qty 1

## 2017-12-13 MED ORDER — SODIUM CHLORIDE 0.9% FLUSH: 3.0000 mL | INTRAVENOUS | Status: DC | PRN

## 2017-12-13 MED ORDER — OXYBUTYNIN CHLORIDE 5 MG PO TABS
5.0000 mg | ORAL_TABLET | Freq: Two times a day (BID) | ORAL | Status: DC
  Administered 2017-12-13 – 2017-12-16 (×6): 5 mg via ORAL
  Filled 2017-12-13 (×6): qty 1

## 2017-12-13 MED ORDER — SODIUM CHLORIDE 0.9 % IV SOLN
INTRAVENOUS | Status: DC
  Administered 2017-12-13: 19:00:00 via INTRAVENOUS

## 2017-12-13 MED ORDER — TRAZODONE HCL 50 MG PO TABS
50.0000 mg | ORAL_TABLET | Freq: Every evening | ORAL | Status: DC | PRN
  Administered 2017-12-13 – 2017-12-15 (×3): 50 mg via ORAL
  Filled 2017-12-13 (×3): qty 1

## 2017-12-13 MED ORDER — OXYCODONE ER 36 MG PO C12A: 36.0000 mg | EXTENDED_RELEASE_CAPSULE | Freq: Two times a day (BID) | ORAL | Status: DC

## 2017-12-13 MED ORDER — PANTOPRAZOLE SODIUM 40 MG PO TBEC
40.0000 mg | DELAYED_RELEASE_TABLET | Freq: Every day | ORAL | Status: DC
  Administered 2017-12-14 – 2017-12-16 (×3): 40 mg via ORAL
  Filled 2017-12-13 (×3): qty 1

## 2017-12-13 MED ORDER — SODIUM CHLORIDE 0.9 % IV BOLUS (SEPSIS)
1000.0000 mL | Freq: Once | INTRAVENOUS | Status: AC
  Administered 2017-12-13: 1000 mL via INTRAVENOUS

## 2017-12-13 MED ORDER — DEXTROSE 5 % IV SOLN
1.0000 g | INTRAVENOUS | Status: DC
  Administered 2017-12-14: 1 g via INTRAVENOUS
  Filled 2017-12-13: qty 10

## 2017-12-13 MED ORDER — SODIUM CHLORIDE 0.9 % IV BOLUS (SEPSIS)
2000.0000 mL | Freq: Once | INTRAVENOUS | Status: AC
  Administered 2017-12-13: 2000 mL via INTRAVENOUS

## 2017-12-13 MED ORDER — FERROUS SULFATE 325 (65 FE) MG PO TABS
325.0000 mg | ORAL_TABLET | Freq: Two times a day (BID) | ORAL | Status: DC
  Administered 2017-12-14 – 2017-12-16 (×5): 325 mg via ORAL
  Filled 2017-12-13 (×5): qty 1

## 2017-12-13 MED ORDER — OXYCODONE-ACETAMINOPHEN 10-325 MG PO TABS: 1.0000 | ORAL_TABLET | Freq: Three times a day (TID) | ORAL | Status: DC | PRN

## 2017-12-13 MED ORDER — ALBUTEROL SULFATE (2.5 MG/3ML) 0.083% IN NEBU: 2.5000 mg | INHALATION_SOLUTION | RESPIRATORY_TRACT | Status: DC | PRN

## 2017-12-13 MED ORDER — ACETAMINOPHEN 325 MG PO TABS
650.0000 mg | ORAL_TABLET | Freq: Once | ORAL | Status: AC
  Administered 2017-12-13: 650 mg via ORAL
  Filled 2017-12-13: qty 2

## 2017-12-13 MED ORDER — CRANBERRY 1000 MG PO CAPS: 2000.0000 mg | ORAL_CAPSULE | Freq: Two times a day (BID) | ORAL | Status: DC

## 2017-12-13 MED ORDER — ACETAMINOPHEN 650 MG RE SUPP: 650.0000 mg | Freq: Four times a day (QID) | RECTAL | Status: DC | PRN

## 2017-12-13 MED ORDER — POLYETHYLENE GLYCOL 3350 17 G PO PACK: 17.0000 g | PACK | Freq: Every day | ORAL | Status: DC | PRN

## 2017-12-13 MED ORDER — ONDANSETRON HCL 4 MG PO TABS: 4.0000 mg | ORAL_TABLET | Freq: Four times a day (QID) | ORAL | Status: DC | PRN

## 2017-12-13 MED ORDER — SENNA 8.6 MG PO TABS
1.0000 | ORAL_TABLET | Freq: Two times a day (BID) | ORAL | Status: DC
  Administered 2017-12-14 – 2017-12-16 (×4): 8.6 mg via ORAL
  Filled 2017-12-13 (×5): qty 1

## 2017-12-13 MED ORDER — ONDANSETRON HCL 4 MG/2ML IJ SOLN: 4.0000 mg | Freq: Four times a day (QID) | INTRAMUSCULAR | Status: DC | PRN

## 2017-12-13 MED ORDER — PREGABALIN 50 MG PO CAPS
300.0000 mg | ORAL_CAPSULE | Freq: Two times a day (BID) | ORAL | Status: DC
  Administered 2017-12-13 – 2017-12-16 (×6): 300 mg via ORAL
  Filled 2017-12-13 (×6): qty 6

## 2017-12-13 MED ORDER — LORATADINE 10 MG PO TABS
10.0000 mg | ORAL_TABLET | Freq: Every day | ORAL | Status: DC
  Administered 2017-12-14 – 2017-12-16 (×3): 10 mg via ORAL
  Filled 2017-12-13 (×3): qty 1

## 2017-12-13 MED ORDER — HEPARIN SODIUM (PORCINE) 5000 UNIT/ML IJ SOLN
5000.0000 [IU] | Freq: Three times a day (TID) | INTRAMUSCULAR | Status: DC
  Administered 2017-12-13 – 2017-12-16 (×9): 5000 [IU] via SUBCUTANEOUS
  Filled 2017-12-13 (×9): qty 1

## 2017-12-13 MED ORDER — DULOXETINE HCL 30 MG PO CPEP
60.0000 mg | ORAL_CAPSULE | Freq: Two times a day (BID) | ORAL | Status: DC
  Administered 2017-12-13 – 2017-12-16 (×6): 60 mg via ORAL
  Filled 2017-12-13 (×6): qty 2

## 2017-12-13 MED ORDER — OXYCODONE-ACETAMINOPHEN 5-325 MG PO TABS
1.0000 | ORAL_TABLET | Freq: Three times a day (TID) | ORAL | Status: DC | PRN
  Administered 2017-12-15 – 2017-12-16 (×4): 1 via ORAL
  Filled 2017-12-13 (×4): qty 1

## 2017-12-13 MED ORDER — BACLOFEN 10 MG PO TABS
40.0000 mg | ORAL_TABLET | Freq: Two times a day (BID) | ORAL | Status: DC
  Administered 2017-12-13 – 2017-12-16 (×6): 40 mg via ORAL
  Filled 2017-12-13 (×6): qty 4

## 2017-12-13 MED ORDER — SODIUM CHLORIDE 0.9% FLUSH
3.0000 mL | Freq: Two times a day (BID) | INTRAVENOUS | Status: DC
  Administered 2017-12-15: 3 mL via INTRAVENOUS

## 2017-12-13 MED ORDER — ENSURE ENLIVE PO LIQD
237.0000 mL | Freq: Two times a day (BID) | ORAL | Status: DC
  Administered 2017-12-14 – 2017-12-16 (×5): 237 mL via ORAL

## 2017-12-13 MED ORDER — ZINC SULFATE 220 (50 ZN) MG PO CAPS
220.0000 mg | ORAL_CAPSULE | Freq: Every day | ORAL | Status: DC
  Administered 2017-12-14 – 2017-12-16 (×3): 220 mg via ORAL
  Filled 2017-12-13 (×3): qty 1

## 2017-12-13 MED ORDER — DEXTROSE 5 % IV SOLN
2.0000 g | Freq: Once | INTRAVENOUS | Status: AC
  Administered 2017-12-13: 2 g via INTRAVENOUS
  Filled 2017-12-13: qty 2

## 2017-12-13 MED ORDER — OXYCODONE HCL ER 40 MG PO T12A
40.0000 mg | EXTENDED_RELEASE_TABLET | Freq: Two times a day (BID) | ORAL | Status: DC
  Administered 2017-12-13 – 2017-12-16 (×7): 40 mg via ORAL
  Filled 2017-12-13: qty 4
  Filled 2017-12-13 (×6): qty 1

## 2017-12-13 MED ORDER — OXYCODONE HCL 5 MG PO TABS
5.0000 mg | ORAL_TABLET | Freq: Three times a day (TID) | ORAL | Status: DC | PRN
  Administered 2017-12-15 – 2017-12-16 (×3): 5 mg via ORAL
  Filled 2017-12-13 (×4): qty 1

## 2017-12-13 MED ORDER — FOLIC ACID 1 MG PO TABS
1.0000 mg | ORAL_TABLET | Freq: Every day | ORAL | Status: DC
  Administered 2017-12-14 – 2017-12-16 (×3): 1 mg via ORAL
  Filled 2017-12-13 (×3): qty 1

## 2017-12-13 MED ORDER — COLLAGENASE 250 UNIT/GM EX OINT: 1.0000 "application " | TOPICAL_OINTMENT | Freq: Every day | CUTANEOUS | Status: DC | PRN

## 2017-12-13 MED ORDER — SODIUM CHLORIDE 0.9 % IV SOLN: 250.0000 mL | INTRAVENOUS | Status: DC | PRN

## 2017-12-13 MED ORDER — ACETAMINOPHEN 325 MG PO TABS: 650.0000 mg | ORAL_TABLET | Freq: Four times a day (QID) | ORAL | Status: DC | PRN

## 2017-12-13 MED ORDER — SODIUM CHLORIDE 0.9 % IV SOLN
INTRAVENOUS | Status: DC
  Administered 2017-12-15 – 2017-12-16 (×3): via INTRAVENOUS

## 2017-12-13 NOTE — Progress Notes (Signed)
PHARMACY NOTE -  Ceftriaxone  Pharmacy has been consulted to dose Ceftriaxone for UTI. Ceftriaxone 2g IV x 1 ordered in ED for today.  Will order Ceftriaxone 1g IV q24h, starting tomorrow for UTI indication.  No renal adjustment needed; therefore, pharmacy will sign off since need for further dosage adjustment appears unlikely at present.    Please reconsult if a change in clinical status warrants re-evaluation of dosage.  Thank you for the consult.  [name], PharmD Pager: [phone] 12/13/2017

## 2017-12-13 NOTE — ED Triage Notes (Signed)
Pt reports possible UTI. Pt is paraplegic. Pt denies pain. Pt reports weakness, discolored urine, chills.
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
## 2017-12-13 NOTE — Progress Notes (Signed)
This RN and Bess Kinds RN performed extensive skin assessment. Patient has pressure injury to both ankles, worse on right than left. Left foot and almost up to knee are edematous, hot to touch, and red. Healing pressure injury to coccyx. Unstageable to left hip. Across the top of his back and down left shoulder is severe dermatitis with multiple open areas. Also open dermatitis areas to chest, left abdomen, left face at jaw line, right lower back, and right thigh. All open areas were cleansed with normal saline and covered with foam dressing.

## 2017-12-13 NOTE — ED Notes (Signed)
Bed: WA09 Expected date:  Expected time:  Means of arrival:  Comments: 32 yo UTI, paralyzed from waist down
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
## 2017-12-14 DIAGNOSIS — T148XXA Other injury of unspecified body region, initial encounter: Secondary | ICD-10-CM

## 2017-12-14 DIAGNOSIS — L089 Local infection of the skin and subcutaneous tissue, unspecified: Secondary | ICD-10-CM

## 2017-12-14 DIAGNOSIS — D649 Anemia, unspecified: Secondary | ICD-10-CM

## 2017-12-14 LAB — BASIC METABOLIC PANEL
Anion gap: 5 (ref 5–15)
BUN: 25 mg/dL — AB (ref 6–20)
CO2: 24 mmol/L (ref 22–32)
CREATININE: 1.06 mg/dL (ref 0.61–1.24)
Calcium: 8.5 mg/dL — ABNORMAL LOW (ref 8.9–10.3)
Chloride: 111 mmol/L (ref 101–111)
GFR calc Af Amer: >60 mL/min (ref >60)
GFR calc non Af Amer: >60 mL/min (ref >60)
GLUCOSE: 118 mg/dL — AB (ref 65–99)
Potassium: 4.1 mmol/L (ref 3.5–5.1)
Sodium: 140 mmol/L (ref 135–145)

## 2017-12-14 LAB — BLOOD CULTURE ID PANEL (REFLEXED)
Acinetobacter baumannii: NOT DETECTED
CANDIDA ALBICANS: NOT DETECTED
CANDIDA KRUSEI: NOT DETECTED
CANDIDA PARAPSILOSIS: NOT DETECTED
CANDIDA TROPICALIS: NOT DETECTED
CARBAPENEM RESISTANCE: NOT DETECTED
Candida glabrata: NOT DETECTED
ENTEROBACTER CLOACAE COMPLEX: NOT DETECTED
Enterobacteriaceae species: NOT DETECTED
Enterococcus species: NOT DETECTED
Escherichia coli: NOT DETECTED
HAEMOPHILUS INFLUENZAE: NOT DETECTED
KLEBSIELLA OXYTOCA: NOT DETECTED
KLEBSIELLA PNEUMONIAE: NOT DETECTED
Listeria monocytogenes: NOT DETECTED
METHICILLIN RESISTANCE: NOT DETECTED
Neisseria meningitidis: NOT DETECTED
PROTEUS SPECIES: NOT DETECTED
Pseudomonas aeruginosa: NOT DETECTED
STREPTOCOCCUS PNEUMONIAE: NOT DETECTED
STREPTOCOCCUS PYOGENES: NOT DETECTED
Serratia marcescens: NOT DETECTED
Staphylococcus aureus (BCID): NOT DETECTED
Staphylococcus species: DETECTED — AB
Streptococcus agalactiae: NOT DETECTED
Streptococcus species: NOT DETECTED

## 2017-12-14 LAB — CBC
HEMATOCRIT: 23 % — AB (ref 39.0–52.0)
Hemoglobin: 6.9 g/dL — CL (ref 13.0–17.0)
MCH: 20.1 pg — AB (ref 26.0–34.0)
MCHC: 30 g/dL (ref 30.0–36.0)
MCV: 67.1 fL — ABNORMAL LOW (ref 78.0–100.0)
PLATELETS: 328 10*3/uL (ref 150–400)
RBC: 3.43 MIL/uL — ABNORMAL LOW (ref 4.22–5.81)
RDW: 17.8 % — AB (ref 11.5–15.5)
WBC: 11.9 10*3/uL — ABNORMAL HIGH (ref 4.0–10.5)

## 2017-12-14 LAB — PREPARE RBC (CROSSMATCH)

## 2017-12-14 MED ORDER — CEFTRIAXONE SODIUM 2 G IJ SOLR
2.0000 g | INTRAMUSCULAR | Status: DC
  Administered 2017-12-15: 2 g via INTRAVENOUS
  Filled 2017-12-14 (×2): qty 2

## 2017-12-14 MED ORDER — ADULT MULTIVITAMIN W/MINERALS CH
1.0000 | ORAL_TABLET | Freq: Every day | ORAL | Status: DC
  Administered 2017-12-14 – 2017-12-16 (×3): 1 via ORAL
  Filled 2017-12-14 (×3): qty 1

## 2017-12-14 MED ORDER — DEXTROSE 5 % IV SOLN
1.0000 g | Freq: Once | INTRAVENOUS | Status: AC
  Administered 2017-12-14: 1 g via INTRAVENOUS
  Filled 2017-12-14: qty 10

## 2017-12-14 MED ORDER — SALINE SPRAY 0.65 % NA SOLN
1.0000 | NASAL | Status: DC | PRN
  Filled 2017-12-14: qty 44

## 2017-12-14 MED ORDER — LIP MEDEX EX OINT
TOPICAL_OINTMENT | CUTANEOUS | Status: DC | PRN
  Filled 2017-12-14: qty 7

## 2017-12-14 MED ORDER — SODIUM CHLORIDE 0.9 % IV SOLN
Freq: Once | INTRAVENOUS | Status: AC
  Administered 2017-12-14: 15:00:00 via INTRAVENOUS
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
## 2017-12-14 NOTE — Progress Notes (Signed)
CRITICAL VALUE ALERT  Critical Value:  hemaglobin = 6.9  Date & Time Notied: 12/14/17 @ 0700  Provider Notified: [facility] via Amion   Orders Received/Actions taken: pending
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
## 2017-12-14 NOTE — Progress Notes (Addendum)
B/P low at 73/43  -  repeated several times fetting b/ps <80/44 Increased current IVfluids  300cc/hr

## 2017-12-14 NOTE — Progress Notes (Addendum)
PHARMACY - PHYSICIAN COMMUNICATION CRITICAL VALUE ALERT - BLOOD CULTURE IDENTIFICATION (BCID)  Sean Horton is an 32 y.o. male who presented to [facility] on 12/13/2017 with a chief complaint of sepsis, UTI.  Assessment:  41 yoM with sepsis with UTI and left hip wounds. Per discussion with MD, patient still with fever so question if blood culture result contaminant vs potential pathogen. Only 1 of the 3 bottles collected for blood culture grew coag neg staph, with is MecA negative.  Name of physician (or Provider) Contacted: Dr. Darrick Meigs  Current antibiotics: Ceftriaxone 1g q24h  Changes to prescribed antibiotics recommended:  Increase Ceftriaxone to 2g IV q24h for potential blood stream infection  Results for orders placed or performed during the hospital encounter of 12/13/17  Blood Culture ID Panel (Reflexed) (Collected: 12/13/2017 11:50 AM)  Result Value Ref Range   Enterococcus species NOT DETECTED NOT DETECTED   Listeria monocytogenes NOT DETECTED NOT DETECTED   Staphylococcus species DETECTED (A) NOT DETECTED   Staphylococcus aureus NOT DETECTED NOT DETECTED   Methicillin resistance NOT DETECTED NOT DETECTED   Streptococcus species NOT DETECTED NOT DETECTED   Streptococcus agalactiae NOT DETECTED NOT DETECTED   Streptococcus pneumoniae NOT DETECTED NOT DETECTED   Streptococcus pyogenes NOT DETECTED NOT DETECTED   Acinetobacter baumannii NOT DETECTED NOT DETECTED   Enterobacteriaceae species NOT DETECTED NOT DETECTED   Enterobacter cloacae complex NOT DETECTED NOT DETECTED   Escherichia coli NOT DETECTED NOT DETECTED   Klebsiella oxytoca NOT DETECTED NOT DETECTED   Klebsiella pneumoniae NOT DETECTED NOT DETECTED   Proteus species NOT DETECTED NOT DETECTED   Serratia marcescens NOT DETECTED NOT DETECTED   Carbapenem resistance NOT DETECTED NOT DETECTED   Haemophilus influenzae NOT DETECTED NOT DETECTED   Neisseria meningitidis NOT DETECTED NOT DETECTED   Pseudomonas aeruginosa  NOT DETECTED NOT DETECTED   Candida albicans NOT DETECTED NOT DETECTED   Candida glabrata NOT DETECTED NOT DETECTED   Candida krusei NOT DETECTED NOT DETECTED   Candida parapsilosis NOT DETECTED NOT DETECTED   Candida tropicalis NOT DETECTED NOT DETECTED    [name] 12/14/2017  4:33 PM

## 2017-12-14 NOTE — Care Management Note (Signed)
Case Management Note  Patient Details  Name: Sean Horton MRN: [id number] Date of Birth: 01-01-1986  Subjective/Objective:                  sepsis  Action/Plan: Date: December 14, 2017 Velva Harman, BSN, [place], [place] Chart and notes review for patient progress and needs. Will follow for case management and discharge needs./Has RN from [place] at home No cm or discharge needs present at time of this review. Next review date: [id number] Expected Discharge Date:  (unknown)               Expected Discharge Plan:  [place]  In-House Referral:     Discharge planning Services  CM Consult  Post Acute Care Choice:  Resumption of Svcs/PTA Provider Choice offered to:     DME Arranged:    DME Agency:     HH Arranged:    [place] Agency:  [place]  Status of Service:  In process, will continue to follow  If discussed at Long Length of Stay Meetings, dates discussed:    Additional Comments:  Leeroy Cha, RN 12/14/2017, 8:46 AM

## 2017-12-15 DIAGNOSIS — A4151 Sepsis due to Escherichia coli [E. coli]: Secondary | ICD-10-CM

## 2017-12-15 DIAGNOSIS — N390 Urinary tract infection, site not specified: Secondary | ICD-10-CM

## 2017-12-15 DIAGNOSIS — L98492 Non-pressure chronic ulcer of skin of other sites with fat layer exposed: Secondary | ICD-10-CM

## 2017-12-15 DIAGNOSIS — R7881 Bacteremia: Secondary | ICD-10-CM

## 2017-12-15 DIAGNOSIS — L98499 Non-pressure chronic ulcer of skin of other sites with unspecified severity: Secondary | ICD-10-CM

## 2017-12-15 DIAGNOSIS — Z9289 Personal history of other medical treatment: Secondary | ICD-10-CM

## 2017-12-15 LAB — BASIC METABOLIC PANEL
ANION GAP: 4 — AB (ref 5–15)
BUN: 17 mg/dL (ref 6–20)
CALCIUM: 8.6 mg/dL — AB (ref 8.9–10.3)
CO2: 24 mmol/L (ref 22–32)
CREATININE: 0.81 mg/dL (ref 0.61–1.24)
Chloride: 110 mmol/L (ref 101–111)
GLUCOSE: 117 mg/dL — AB (ref 65–99)
Potassium: 4 mmol/L (ref 3.5–5.1)
Sodium: 138 mmol/L (ref 135–145)

## 2017-12-15 LAB — URINE CULTURE: Culture: >=100000 — AB

## 2017-12-15 LAB — PREPARE RBC (CROSSMATCH)

## 2017-12-15 LAB — CBC
HEMATOCRIT: 24.2 % — AB (ref 39.0–52.0)
Hemoglobin: 7.4 g/dL — ABNORMAL LOW (ref 13.0–17.0)
MCH: 21.1 pg — ABNORMAL LOW (ref 26.0–34.0)
MCHC: 30.6 g/dL (ref 30.0–36.0)
MCV: 68.9 fL — AB (ref 78.0–100.0)
Platelets: 327 10*3/uL (ref 150–400)
RBC: 3.51 MIL/uL — ABNORMAL LOW (ref 4.22–5.81)
RDW: 19.4 % — AB (ref 11.5–15.5)
WBC: 11.8 10*3/uL — AB (ref 4.0–10.5)

## 2017-12-15 LAB — ABO/RH: ABO/RH(D): O POS

## 2017-12-15 MED ORDER — MORPHINE SULFATE (PF) 4 MG/ML IV SOLN
1.0000 mg | INTRAVENOUS | Status: DC | PRN
  Administered 2017-12-15: 1 mg via INTRAVENOUS
  Filled 2017-12-15: qty 1

## 2017-12-15 MED ORDER — FUROSEMIDE 10 MG/ML IJ SOLN
20.0000 mg | Freq: Once | INTRAMUSCULAR | Status: AC
  Administered 2017-12-15: 20 mg via INTRAVENOUS
  Filled 2017-12-15: qty 2

## 2017-12-15 MED ORDER — SODIUM CHLORIDE 0.9 % IV SOLN
Freq: Once | INTRAVENOUS | Status: AC
  Administered 2017-12-15: 17:00:00 via INTRAVENOUS
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
## 2017-12-16 DIAGNOSIS — N319 Neuromuscular dysfunction of bladder, unspecified: Secondary | ICD-10-CM

## 2017-12-16 DIAGNOSIS — D500 Iron deficiency anemia secondary to blood loss (chronic): Secondary | ICD-10-CM

## 2017-12-16 DIAGNOSIS — G8220 Paraplegia, unspecified: Secondary | ICD-10-CM

## 2017-12-16 LAB — BPAM RBC
Blood Product Expiration Date: 201902162359
Blood Product Expiration Date: 201902172359
ISSUE DATE / TIME: 201901182144
ISSUE DATE / TIME: 201901191608
UNIT TYPE AND RH: 5100
Unit Type and Rh: 5100

## 2017-12-16 LAB — CULTURE, BLOOD (ROUTINE X 2): Special Requests: ADEQUATE

## 2017-12-16 LAB — TYPE AND SCREEN
ABO/RH(D): O POS
ANTIBODY SCREEN: NEGATIVE
UNIT DIVISION: 0
UNIT DIVISION: 0

## 2017-12-16 LAB — CBC WITH DIFFERENTIAL/PLATELET
BASOS ABS: 0 10*3/uL (ref 0.0–0.1)
Basophils Relative: 0 %
EOS ABS: 0.4 10*3/uL (ref 0.0–0.7)
Eosinophils Relative: 3 %
HEMATOCRIT: 28.2 % — AB (ref 39.0–52.0)
HEMOGLOBIN: 8.9 g/dL — AB (ref 13.0–17.0)
LYMPHS PCT: 19 %
Lymphs Abs: 2.2 10*3/uL (ref 0.7–4.0)
MCH: 21.5 pg — ABNORMAL LOW (ref 26.0–34.0)
MCHC: 31.6 g/dL (ref 30.0–36.0)
MCV: 68.3 fL — ABNORMAL LOW (ref 78.0–100.0)
MONOS PCT: 8 %
Monocytes Absolute: 0.9 10*3/uL (ref 0.1–1.0)
Neutro Abs: 8.3 10*3/uL — ABNORMAL HIGH (ref 1.7–7.7)
Neutrophils Relative %: 70 %
Platelets: 369 10*3/uL (ref 150–400)
RBC: 4.13 MIL/uL — AB (ref 4.22–5.81)
RDW: 19.8 % — AB (ref 11.5–15.5)
WBC: 11.8 10*3/uL — AB (ref 4.0–10.5)

## 2017-12-16 MED ORDER — CEFUROXIME AXETIL 500 MG PO TABS: 500.0000 mg | ORAL_TABLET | Freq: Two times a day (BID) | ORAL | 0 refills | Status: AC

## 2017-12-16 MED ORDER — POLYSACCHARIDE IRON COMPLEX 150 MG PO CAPS: 150.0000 mg | ORAL_CAPSULE | Freq: Two times a day (BID) | ORAL | 1 refills | Status: DC

## 2017-12-16 NOTE — Care Management Note (Signed)
Case Management Note  Patient Details  Name: Sean Horton MRN: [id number] Date of Birth: [birth year]  Subjective/Objective:  Sepsis d/t UTI, decubitus ulcer, AKI, paraplegia                    Action/Plan: Spoke to pt and lives alone. States he has family that assist with his care if needed. States he manages at home. Has hospital bed, wheelchair, and shower chair. Active with [facility] for [facility] RN. Contacted [facility] to make aware of dc home today.  PTAR arranged.  PCP [name] MD   Expected Discharge Date:  12/16/17               Expected Discharge Plan:  [place]  In-House Referral:  NA  Discharge planning Services  CM Consult  Post Acute Care Choice:  Resumption of Svcs/PTA Provider, Home Health Choice offered to:  Patient  DME Arranged:  N/A DME Agency:  NA  HH Arranged:  RN [place] Agency:  [place]  Status of Service:  Completed, signed off  If discussed at [place] of Stay Meetings, dates discussed:    Additional Comments:  Erenest Rasher, RN 12/16/2017, 3:44 PM

## 2017-12-16 NOTE — Progress Notes (Signed)
Pt is discharged to home. DC instructions given. Prescription x 1 given. No concerns voiced. Left unit via ambulance transport to home. Left in stable condition. [facility] Bogus.
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
## 2017-12-18 ENCOUNTER — Telehealth: Payer: Self-pay | Admitting: Family Medicine

## 2017-12-18 ENCOUNTER — Telehealth: Payer: Self-pay | Admitting: *Deleted

## 2017-12-18 LAB — CULTURE, BLOOD (ROUTINE X 2): Culture: NO GROWTH

## 2017-12-18 NOTE — Telephone Encounter (Signed)
Received message on nurse line from Nurse with [facility] requesting resumption of care orders to resume [place]. Also reporting pt with LLL and foot swelling with one small weepy area. Nurse may be reached at [phone]. Hubbard Hartshorn, RN, BSN

## 2017-12-18 NOTE — Telephone Encounter (Signed)
pts left leg is swollen. It will not go down even when he elevates it.  He would like lasix for it.  [place].  Please advise

## 2017-12-18 NOTE — Telephone Encounter (Signed)
This sounds concerning for a DVT. I'd prefer he be seen to assess the leg swelling in case there is a clot in there. He also should be seen for hospital follow up anyway since he was just discharged.

## 2017-12-18 NOTE — Telephone Encounter (Signed)
Will forward to MD. Jazmin Hartsell,CMA  

## 2017-12-19 ENCOUNTER — Emergency Department (HOSPITAL_COMMUNITY)
Admission: EM | Admit: 2017-12-19 | Discharge: 2017-12-19 | Disposition: A | Discharge status: Home / Self Care | Payer: Medicaid Other | Attending: Emergency Medicine | Admitting: Emergency Medicine

## 2017-12-19 ENCOUNTER — Emergency Department (HOSPITAL_BASED_OUTPATIENT_CLINIC_OR_DEPARTMENT_OTHER): Payer: Medicaid Other

## 2017-12-19 ENCOUNTER — Encounter (HOSPITAL_COMMUNITY): Payer: Self-pay | Admitting: Emergency Medicine

## 2017-12-19 ENCOUNTER — Telehealth: Payer: Self-pay | Admitting: Family Medicine

## 2017-12-19 ENCOUNTER — Other Ambulatory Visit: Payer: Self-pay | Admitting: Family Medicine

## 2017-12-19 ENCOUNTER — Other Ambulatory Visit: Payer: Self-pay

## 2017-12-19 DIAGNOSIS — I10 Essential (primary) hypertension: Secondary | ICD-10-CM | POA: Diagnosis not present

## 2017-12-19 DIAGNOSIS — Z79899 Other long term (current) drug therapy: Secondary | ICD-10-CM | POA: Insufficient documentation

## 2017-12-19 DIAGNOSIS — R591 Generalized enlarged lymph nodes: Secondary | ICD-10-CM | POA: Insufficient documentation

## 2017-12-19 DIAGNOSIS — Z87440 Personal history of urinary (tract) infections: Secondary | ICD-10-CM | POA: Insufficient documentation

## 2017-12-19 DIAGNOSIS — M7989 Other specified soft tissue disorders: Secondary | ICD-10-CM

## 2017-12-19 DIAGNOSIS — L03116 Cellulitis of left lower limb: Secondary | ICD-10-CM | POA: Insufficient documentation

## 2017-12-19 DIAGNOSIS — R609 Edema, unspecified: Secondary | ICD-10-CM | POA: Diagnosis not present

## 2017-12-19 DIAGNOSIS — F17210 Nicotine dependence, cigarettes, uncomplicated: Secondary | ICD-10-CM | POA: Diagnosis not present

## 2017-12-19 DIAGNOSIS — G8221 Paraplegia, complete: Secondary | ICD-10-CM | POA: Diagnosis not present

## 2017-12-19 DIAGNOSIS — R600 Localized edema: Secondary | ICD-10-CM | POA: Diagnosis present

## 2017-12-19 DIAGNOSIS — M79662 Pain in left lower leg: Secondary | ICD-10-CM

## 2017-12-19 LAB — AEROBIC/ANAEROBIC CULTURE (SURGICAL/DEEP WOUND): SPECIAL REQUESTS: NORMAL

## 2017-12-19 LAB — AEROBIC/ANAEROBIC CULTURE W GRAM STAIN (SURGICAL/DEEP WOUND)

## 2017-12-19 MED ORDER — RIVAROXABAN 15 MG PO TABS
15.0000 mg | ORAL_TABLET | Freq: Once | ORAL | Status: AC
  Administered 2017-12-19: 15 mg via ORAL
  Filled 2017-12-19: qty 1

## 2017-12-19 MED ORDER — FERROUS SULFATE 325 (65 FE) MG PO TABS: 325.0000 mg | ORAL_TABLET | Freq: Two times a day (BID) | ORAL | 3 refills | Status: DC

## 2017-12-19 MED ORDER — CLINDAMYCIN HCL 150 MG PO CAPS: 300.0000 mg | ORAL_CAPSULE | Freq: Four times a day (QID) | ORAL | 0 refills | Status: AC

## 2017-12-19 MED ORDER — ACETAMINOPHEN 325 MG PO TABS
650.0000 mg | ORAL_TABLET | Freq: Once | ORAL | Status: AC
Start: 2017-12-19 — End: 2017-12-19
  Administered 2017-12-19: 650 mg via ORAL
  Filled 2017-12-19: qty 2

## 2017-12-19 MED ORDER — CLINDAMYCIN HCL 300 MG PO CAPS
300.0000 mg | ORAL_CAPSULE | Freq: Once | ORAL | Status: AC
  Administered 2017-12-19: 300 mg via ORAL
  Filled 2017-12-19: qty 1

## 2017-12-19 NOTE — ED Notes (Signed)
Bed: XY58 Expected date:  Expected time:  Means of arrival:  Comments: 32 yr old, paraplegic left lower leg swelling

## 2017-12-19 NOTE — ED Triage Notes (Signed)
Pt BIB [facility] from home with complaints of left leg swelling. Patient was recently admitted for a UTI and noticed the swelling when he was discharged. Patient states that the swelling has increased and the leg is now draining. Patient called his doctor yesterday but has yet to hear back.
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
## 2017-12-19 NOTE — Telephone Encounter (Signed)
Returned call to give verbal orders to resume [place] once a week for 9 more weeks.   Lucila Maine, DO PGY-2, [facility] 12/19/2017 10:46 AM

## 2017-12-19 NOTE — Telephone Encounter (Signed)
Ok great, thank you. Happy to see him in clinic it looks like they sent him home with antibiotics for presumed infection and recommended recheck with [place] in 2 days. I'm not in clinic Friday but if he is willing to come in he can be seen by anyone. Thank you!

## 2017-12-19 NOTE — Telephone Encounter (Signed)
Returned call to patient. We discussed his hospitalization due to anemia and UTI. He reports his left leg is infected and he was prescribed Clindamycin but has not picked it up yet. Leg is swollen, "full of fluid". Leg [place] was negative for DVT. He thinks his pressure ulcer got infected on his left leg. He doesn't think he can be seen tomorrow or Friday but will work on getting a ride. He prefers to see me but I encouraged him to make an appointment with any resident. He states he will call when he is able to figure out a ride. I encouraged him to go to the ED if he worsens.   Lucila Maine, DO PGY-2, [facility] 12/19/2017 3:41 PM

## 2017-12-19 NOTE — Progress Notes (Signed)
*  PRELIMINARY RESULTS* Vascular Ultrasound Left lower extremity venous duplex has been completed.  Preliminary findings: No evidence of deep vein thrombosis in the visualized veins of the right lower extremity.  Negative for baker's cyst on the left.  Difficult exam due to patient positioning and lower extremity edema.  Prominent lymph node noted in the right groin.   Everrett Coombe 12/19/2017, 8:46 AM

## 2017-12-19 NOTE — Telephone Encounter (Signed)
Pt wants Riccio to call him. He said Vanetta Shawl wanted him to come in for an appointment but he's trying to figure out how to get into the office. He found out in the hospital his leg was infected and he has some questions for [facility]

## 2017-12-19 NOTE — Discharge Instructions (Addendum)
You are seen in the ER for the leg swelling. If the DVT scan is negative, you will be sent home with antibiotics.  Take the antibiotics as prescribed, and the swelling should get better.  If the swelling does not get better despite antibiotics, see your doctor and you might need a repeat ultrasound of your leg.

## 2017-12-19 NOTE — ED Provider Notes (Signed)
Received care of patient at 7 AM from Dr. Kathrynn Humble.  Please see his note for prior history, physical and care.  Briefly this is a 32 year old male with a history of paraplegia and recent hospital admission for urinary tract infection who presented with left lower extremity swelling.  Patient had received a dose of Xarelto, DVT studies pending.   DVT study shows no evidence of DVT, although venous difficult to compress.  He does have inguinal lymphadenopathy.  In setting of lymphadenopathy, erythema and swelling of the left lower extremity with associated pressure ulcer, suspect symptoms are likely related to infection or cellulitis.  Patient will be discharged on clindamycin for continued care.  Recommend follow-up with his primary care physician in 48 hours for recheck of cellulitis, as well as consideration for repeat DVT ultrasound. Patient discharged in stable condition with understanding of reasons to return.    Sean Morgan, MD 12/19/17 [phone]

## 2017-12-19 NOTE — Progress Notes (Signed)
  Insurance would not pay for niferex. Sent in Rx for ferrous sulfate.   Lucila Maine, DO PGY-2, [facility] 12/19/2017 10:52 AM

## 2017-12-19 NOTE — Telephone Encounter (Signed)
Patient in the ED for this swelling currently and scheduled for an ultrasound this morning. Jazmin Hartsell,CMA

## 2017-12-19 NOTE — ED Notes (Signed)
Pharmacy will need to be notified for patient med counseling if pt is given an Rx for xarelto at discharge.

## 2017-12-20 ENCOUNTER — Telehealth: Payer: Self-pay | Admitting: Family Medicine

## 2017-12-20 NOTE — Telephone Encounter (Signed)
  Called Mr. Hallum to check in. He reports the swelling has gone down some and he started taking clindamycin last night. He is working on a ride to come to clinic. He can come Wednesday late morning. Scheduled appointment with me for Wed at 10:55. Asked him to call back with any questions or concerns. He agrees.   Lucila Maine, DO PGY-2, [facility] 12/20/2017 2:39 PM

## 2017-12-25 ENCOUNTER — Telehealth: Payer: Self-pay | Admitting: Family Medicine

## 2017-12-25 NOTE — Telephone Encounter (Signed)
Wells Guiles from [facility] called and wanted the doctor to know that they were not ab;e to do a blood draw today from the patient. She tried several times. jw

## 2017-12-25 NOTE — Telephone Encounter (Signed)
Ok thanks, I see him tomorrow in clinic and can assess then.

## 2017-12-26 ENCOUNTER — Other Ambulatory Visit: Payer: Self-pay

## 2017-12-26 ENCOUNTER — Ambulatory Visit: Payer: Medicaid Other | Admitting: Family Medicine

## 2017-12-26 ENCOUNTER — Encounter: Payer: Self-pay | Admitting: Family Medicine

## 2017-12-26 VITALS — BP 104/60 | HR 65 | Temp 98.2°F | Wt 200.0 lb

## 2017-12-26 DIAGNOSIS — Z23 Encounter for immunization: Secondary | ICD-10-CM

## 2017-12-26 DIAGNOSIS — G8220 Paraplegia, unspecified: Secondary | ICD-10-CM | POA: Diagnosis not present

## 2017-12-26 DIAGNOSIS — S41102A Unspecified open wound of left upper arm, initial encounter: Secondary | ICD-10-CM | POA: Diagnosis not present

## 2017-12-26 DIAGNOSIS — L98492 Non-pressure chronic ulcer of skin of other sites with fat layer exposed: Secondary | ICD-10-CM | POA: Diagnosis not present

## 2017-12-26 DIAGNOSIS — D500 Iron deficiency anemia secondary to blood loss (chronic): Secondary | ICD-10-CM

## 2017-12-26 MED ORDER — ZINC OXIDE 12.8 % EX OINT: 1.0000 "application " | TOPICAL_OINTMENT | CUTANEOUS | 5 refills | Status: DC | PRN

## 2017-12-26 NOTE — Progress Notes (Signed)
    Subjective:    Patient ID: Sean Horton, male    DOB: 1986-10-12, 32 y.o.   MRN: [id number]  CC: follow up leg swelling, anemia  Leg swelling- Improving a lot with clinda. The wound on ankle is healing well. It is dressed by home health. He is no longer concerned about his leg. No fevers or chills.  Anemia- was hospitalized recently and had 2 blood transfusions. He reports Energy still low. Taking iron twice a day w meals. Denies stomach pain. Stools have become dark with iron but no blood in his stools. He reports prior to hospital he was exhausted and could barely sit up. He feels better but still not 100%. Denies palpitations, SOB.  Chronic wounds- dressed by home health, area on his arm (biopsied in past showing "chronic irritation") still present, he puts vaseline on it.   Smoking status reviewed- current smoker  Review of Systems- see HPI   Objective:  BP 104/60   Pulse 65   Temp 98.2 F (36.8 C) (Oral)   Wt 200 lb (90.7 kg) Comment: pt reported  SpO2 98%   BMI 23.72 kg/m  Vitals and nursing note reviewed  General: wheelchair bound, in NAD Extremities: right leg larger than left, no erythema, warmth, or discharge present. Lateral right ankle bandaged.  Skin: warm and dry. Large area of hyperkerotic skin over right arm and axilla with areas of open wounds, no drainage present Neuro: alert and oriented, no focal deficits   Assessment & Plan:    Absolute anemia  Check CBC or POCT hgb today (patient hard stick) Continue oral iron Patient may benefit from outpatient iron infusions  Non-healing ulcer, multiple sites.  Home health aide ordered for patient Advised barrier cream to area of ulceration on arms    Return if symptoms worsen or fail to improve.   Lucila Maine, DO Family Medicine Resident PGY-2

## 2017-12-26 NOTE — Assessment & Plan Note (Signed)
  Check CBC or POCT hgb today (patient hard stick) Continue oral iron Patient may benefit from outpatient iron infusions

## 2017-12-26 NOTE — Patient Instructions (Signed)
   It was great seeing you today!  I'll call you with your lab results. Please let me know if you have any needs in the meantime.   If you have questions or concerns please do not hesitate to call at [phone].  Lucila Maine, DO PGY-2, [facility] 12/26/2017 11:40 AM

## 2017-12-26 NOTE — Assessment & Plan Note (Signed)
  Home health aide ordered for patient Advised barrier cream to area of ulceration on arms

## 2017-12-27 ENCOUNTER — Telehealth: Payer: Self-pay | Admitting: Family Medicine

## 2017-12-27 LAB — CBC
Hematocrit: 32.2 % — ABNORMAL LOW (ref 37.5–51.0)
Hemoglobin: 9.8 g/dL — ABNORMAL LOW (ref 13.0–17.7)
MCH: 21.8 pg — ABNORMAL LOW (ref 26.6–33.0)
MCHC: 30.4 g/dL — AB (ref 31.5–35.7)
MCV: 72 fL — ABNORMAL LOW (ref 79–97)
Platelets: 645 10*3/uL — ABNORMAL HIGH (ref 150–379)
RBC: 4.5 x10E6/uL (ref 4.14–5.80)
RDW: 22.1 % — AB (ref 12.3–15.4)
WBC: 11.4 10*3/uL — ABNORMAL HIGH (ref 3.4–10.8)

## 2017-12-27 NOTE — Telephone Encounter (Signed)
Tried to call patient but there was no answer and voicemail was full.  Will continue to try and reach him regarding his results. [name] Ta,CMA

## 2017-12-27 NOTE — Telephone Encounter (Signed)
[facility]/ [facility] needs verbal orders for a change in wound care. Right outer ankle change to cleaning with normal saline, apply protected treatment to wound skin, apply hydrogel to wound badge, cover with normal salined gauge and change 3 times a week. Also, with his other wounds, they recommend to continue treatment that's been done this whole time on those wounds, but change twice a week instead of 3 times. Call with verbal orders [phone].

## 2017-12-27 NOTE — Telephone Encounter (Signed)
-----   Message from [name], DO sent at 12/27/2017 10:08 AM EST ----- Regarding: labs good  Can we just call Mr. Muff and let him know his blood counts are good- hemoglobin was 9.8! Thank you!

## 2017-12-27 NOTE — Telephone Encounter (Signed)
Patient informed of results and no other questions. Sean Horton,CMA

## 2017-12-28 NOTE — Telephone Encounter (Signed)
Yes - ok for verbal orders.  

## 2017-12-28 NOTE — Telephone Encounter (Signed)
Verbal ok given to Donita.  Justan Gaede,CMA

## 2018-01-17 ENCOUNTER — Other Ambulatory Visit: Payer: Self-pay

## 2018-01-17 NOTE — Telephone Encounter (Signed)
Bonita (?) left message on nurse line requesting verbal orders to re-certify for care. Call her at [phone].  Patient also needs refill on Omeprazole. Danley Danker, RN [facility] RN)

## 2018-01-18 MED ORDER — OMEPRAZOLE 20 MG PO CPDR: 20.0000 mg | DELAYED_RELEASE_CAPSULE | Freq: Every day | ORAL | 12 refills | Status: DC

## 2018-01-18 NOTE — Telephone Encounter (Signed)
Verbal orders given to [facility].  Lucila Maine, DO PGY-2, [facility] 01/18/2018 11:52 AM

## 2018-02-03 ENCOUNTER — Other Ambulatory Visit: Payer: Self-pay | Admitting: Family Medicine

## 2018-03-16 ENCOUNTER — Other Ambulatory Visit: Payer: Self-pay | Admitting: Family Medicine

## 2018-03-31 ENCOUNTER — Other Ambulatory Visit: Payer: Self-pay | Admitting: Family Medicine

## 2018-04-19 ENCOUNTER — Other Ambulatory Visit: Payer: Self-pay | Admitting: Family Medicine

## 2018-05-13 ENCOUNTER — Telehealth: Payer: Self-pay | Admitting: Family Medicine

## 2018-05-13 NOTE — Telephone Encounter (Signed)
Disability parking placard form dropped off for at front desk for completion.  Verified that patient section of form has been completed.  Last DOS/WCC with PCP was 12/26/17.  Placed form in blue team folder to be completed by clinical staff.  Carmina Miller

## 2018-05-13 NOTE — Telephone Encounter (Signed)
Clinical info completed on handicap form.  Place form in Dr. Stefano Gaul box for completion.  Sean Horton, [place]

## 2018-05-18 ENCOUNTER — Other Ambulatory Visit: Payer: Self-pay | Admitting: Family Medicine

## 2018-05-21 ENCOUNTER — Telehealth: Payer: Self-pay

## 2018-05-21 DIAGNOSIS — L89214 Pressure ulcer of right hip, stage 4: Secondary | ICD-10-CM

## 2018-05-21 NOTE — Telephone Encounter (Signed)
Patient informed form available for pick up at front desk. Copy made for batch scanning. Danley Danker, RN [facility] RN)

## 2018-05-21 NOTE — Telephone Encounter (Signed)
Spoke with patient regarding form completion and he asked if PCP can call him to discuss some home health orders he will need as well as some wheelchair supplies.  Call back is [phone]  Danley Danker, RN [facility])

## 2018-05-21 NOTE — Telephone Encounter (Signed)
Form placed in rn box.  Jazmin Hartsell,CMA

## 2018-05-21 NOTE — Telephone Encounter (Signed)
Form completed, up front for pick up.  Lucila Maine, DO PGY-2, [facility] 05/21/2018 10:38 AM

## 2018-05-23 NOTE — Telephone Encounter (Signed)
Community message sent to [facility]. They will let us know if anything else is needed. Danley Danker, RN [facility] RN)

## 2018-05-23 NOTE — Telephone Encounter (Signed)
Returned call to [employer]. He is in need of dressing change supplies. [facility] has stopped coming out to his house for wound care. He also needs repairs to wheelchair; wheels and "frame broke". DME orders placed for dressing supplies. Would appreciate [facility] addressing wheelchair needs as well, he said he placed a call to them 6/26 but has not heard back.  Lucila Maine, DO PGY-2, [facility] 05/23/2018 11:06 AM

## 2018-06-04 ENCOUNTER — Telehealth: Payer: Self-pay

## 2018-06-04 DIAGNOSIS — L98492 Non-pressure chronic ulcer of skin of other sites with fat layer exposed: Secondary | ICD-10-CM

## 2018-06-04 DIAGNOSIS — G8220 Paraplegia, unspecified: Secondary | ICD-10-CM

## 2018-06-04 DIAGNOSIS — N319 Neuromuscular dysfunction of bladder, unspecified: Secondary | ICD-10-CM

## 2018-06-04 NOTE — Telephone Encounter (Signed)
Patient left message on nurse line stating he thought PCP was going to order home health nursing for him.  Call back is [phone]  Danley Danker, RN [facility])

## 2018-06-05 NOTE — Telephone Encounter (Signed)
To you 

## 2018-06-05 NOTE — Telephone Encounter (Signed)
Patient informed and note made in referral that patient prefers [facility].  Sean Horton,CMA

## 2018-06-05 NOTE — Telephone Encounter (Signed)
  I reordered home health. Hopefully nursing can come out to help him with wound care and ordering supplies. Please let patient know.  Lucila Maine, DO PGY-2, [facility] 06/05/2018 8:08 AM

## 2018-06-14 ENCOUNTER — Telehealth: Payer: Self-pay | Admitting: Family Medicine

## 2018-06-14 NOTE — Telephone Encounter (Signed)
[facility] health is requesting the patient come in for a visit before they could start home health. They said that the patient has not been seen in our office since January. Sean Horton who has seen him in the past declined in seeing the patient again, they said they just have the time and people to take him on.

## 2018-06-14 NOTE — Telephone Encounter (Signed)
Spoke with patient yesterday regarding this and he is going to call us to schedule an appointment once he can get a ride up here. Jazmin Hartsell,CMA

## 2018-06-23 ENCOUNTER — Other Ambulatory Visit: Payer: Self-pay | Admitting: Family Medicine

## 2018-07-10 ENCOUNTER — Telehealth: Payer: Self-pay

## 2018-07-10 NOTE — Telephone Encounter (Signed)
Unfortunately need him to be seen by me, urgent care, or ED if he has concerns for wound infection. Cannot adequately treat without seeing him. Thank you.

## 2018-07-10 NOTE — Telephone Encounter (Signed)
Patient left message on nurse line requesting a broad spectrum antibiotic for hip wound. Stated he believes it is infected.  Returned call to patient to obtain more information and possibly make appt but went straight to voicemail.  Call back is [phone]  Danley Danker, RN [facility])

## 2018-07-11 NOTE — Telephone Encounter (Signed)
Patient informed of all options and that we are unable to call in abx over the phone.  Patient does have difficulty with transportation and will see if he can get a ride to the ED.  Gaynelle Pastrana,CMA

## 2018-07-16 ENCOUNTER — Other Ambulatory Visit: Payer: Self-pay

## 2018-07-16 ENCOUNTER — Ambulatory Visit: Payer: Medicaid Other | Admitting: Family Medicine

## 2018-07-16 ENCOUNTER — Encounter: Payer: Self-pay | Admitting: Family Medicine

## 2018-07-16 ENCOUNTER — Encounter (HOSPITAL_COMMUNITY): Payer: Self-pay

## 2018-07-16 ENCOUNTER — Inpatient Hospital Stay (HOSPITAL_COMMUNITY)
Admission: EM | Admit: 2018-07-16 | Discharge: 2018-08-01 | DRG: 853 | Disposition: A | Discharge status: Home Health | Payer: Medicaid Other | Source: Ambulatory Visit | Attending: Family Medicine | Admitting: Family Medicine

## 2018-07-16 DIAGNOSIS — K5900 Constipation, unspecified: Secondary | ICD-10-CM | POA: Diagnosis present

## 2018-07-16 DIAGNOSIS — N319 Neuromuscular dysfunction of bladder, unspecified: Secondary | ICD-10-CM | POA: Diagnosis present

## 2018-07-16 DIAGNOSIS — Z6823 Body mass index (BMI) 23.0-23.9, adult: Secondary | ICD-10-CM

## 2018-07-16 DIAGNOSIS — M009 Pyogenic arthritis, unspecified: Secondary | ICD-10-CM | POA: Diagnosis present

## 2018-07-16 DIAGNOSIS — N179 Acute kidney failure, unspecified: Secondary | ICD-10-CM | POA: Diagnosis present

## 2018-07-16 DIAGNOSIS — D509 Iron deficiency anemia, unspecified: Secondary | ICD-10-CM | POA: Diagnosis present

## 2018-07-16 DIAGNOSIS — I959 Hypotension, unspecified: Secondary | ICD-10-CM | POA: Diagnosis not present

## 2018-07-16 DIAGNOSIS — E860 Dehydration: Secondary | ICD-10-CM | POA: Diagnosis present

## 2018-07-16 DIAGNOSIS — L989 Disorder of the skin and subcutaneous tissue, unspecified: Secondary | ICD-10-CM | POA: Diagnosis not present

## 2018-07-16 DIAGNOSIS — M609 Myositis, unspecified: Secondary | ICD-10-CM | POA: Diagnosis present

## 2018-07-16 DIAGNOSIS — F17290 Nicotine dependence, other tobacco product, uncomplicated: Secondary | ICD-10-CM | POA: Diagnosis present

## 2018-07-16 DIAGNOSIS — G8220 Paraplegia, unspecified: Secondary | ICD-10-CM | POA: Diagnosis not present

## 2018-07-16 DIAGNOSIS — G894 Chronic pain syndrome: Secondary | ICD-10-CM | POA: Diagnosis not present

## 2018-07-16 DIAGNOSIS — I10 Essential (primary) hypertension: Secondary | ICD-10-CM | POA: Diagnosis present

## 2018-07-16 DIAGNOSIS — D473 Essential (hemorrhagic) thrombocythemia: Secondary | ICD-10-CM | POA: Diagnosis present

## 2018-07-16 DIAGNOSIS — Z978 Presence of other specified devices: Secondary | ICD-10-CM | POA: Diagnosis not present

## 2018-07-16 DIAGNOSIS — E440 Moderate protein-calorie malnutrition: Secondary | ICD-10-CM | POA: Diagnosis present

## 2018-07-16 DIAGNOSIS — K219 Gastro-esophageal reflux disease without esophagitis: Secondary | ICD-10-CM | POA: Diagnosis present

## 2018-07-16 DIAGNOSIS — M00052 Staphylococcal arthritis, left hip: Secondary | ICD-10-CM | POA: Diagnosis not present

## 2018-07-16 DIAGNOSIS — L03116 Cellulitis of left lower limb: Secondary | ICD-10-CM | POA: Diagnosis present

## 2018-07-16 DIAGNOSIS — L089 Local infection of the skin and subcutaneous tissue, unspecified: Secondary | ICD-10-CM

## 2018-07-16 DIAGNOSIS — B9561 Methicillin susceptible Staphylococcus aureus infection as the cause of diseases classified elsewhere: Secondary | ICD-10-CM | POA: Diagnosis not present

## 2018-07-16 DIAGNOSIS — M86452 Chronic osteomyelitis with draining sinus, left femur: Secondary | ICD-10-CM | POA: Diagnosis not present

## 2018-07-16 DIAGNOSIS — M86652 Other chronic osteomyelitis, left thigh: Secondary | ICD-10-CM | POA: Diagnosis not present

## 2018-07-16 DIAGNOSIS — A419 Sepsis, unspecified organism: Secondary | ICD-10-CM | POA: Diagnosis present

## 2018-07-16 DIAGNOSIS — L89150 Pressure ulcer of sacral region, unstageable: Secondary | ICD-10-CM | POA: Diagnosis not present

## 2018-07-16 DIAGNOSIS — M62838 Other muscle spasm: Secondary | ICD-10-CM | POA: Diagnosis present

## 2018-07-16 DIAGNOSIS — M4628 Osteomyelitis of vertebra, sacral and sacrococcygeal region: Secondary | ICD-10-CM | POA: Diagnosis not present

## 2018-07-16 DIAGNOSIS — M8668 Other chronic osteomyelitis, other site: Secondary | ICD-10-CM | POA: Diagnosis present

## 2018-07-16 DIAGNOSIS — L89159 Pressure ulcer of sacral region, unspecified stage: Secondary | ICD-10-CM | POA: Diagnosis present

## 2018-07-16 DIAGNOSIS — L89224 Pressure ulcer of left hip, stage 4: Secondary | ICD-10-CM | POA: Diagnosis present

## 2018-07-16 DIAGNOSIS — G8221 Paraplegia, complete: Secondary | ICD-10-CM | POA: Diagnosis present

## 2018-07-16 DIAGNOSIS — N390 Urinary tract infection, site not specified: Secondary | ICD-10-CM | POA: Diagnosis present

## 2018-07-16 DIAGNOSIS — L97329 Non-pressure chronic ulcer of left ankle with unspecified severity: Secondary | ICD-10-CM | POA: Diagnosis present

## 2018-07-16 DIAGNOSIS — Z720 Tobacco use: Secondary | ICD-10-CM | POA: Diagnosis not present

## 2018-07-16 DIAGNOSIS — Z22321 Carrier or suspected carrier of Methicillin susceptible Staphylococcus aureus: Secondary | ICD-10-CM | POA: Diagnosis not present

## 2018-07-16 DIAGNOSIS — L02219 Cutaneous abscess of trunk, unspecified: Secondary | ICD-10-CM | POA: Diagnosis present

## 2018-07-16 DIAGNOSIS — W3400XS Accidental discharge from unspecified firearms or gun, sequela: Secondary | ICD-10-CM | POA: Diagnosis not present

## 2018-07-16 DIAGNOSIS — Z960 Presence of urogenital implants: Secondary | ICD-10-CM | POA: Diagnosis not present

## 2018-07-16 DIAGNOSIS — T148XXA Other injury of unspecified body region, initial encounter: Secondary | ICD-10-CM | POA: Diagnosis present

## 2018-07-16 DIAGNOSIS — M869 Osteomyelitis, unspecified: Secondary | ICD-10-CM

## 2018-07-16 DIAGNOSIS — S24112S Complete lesion at T2-T6 level of thoracic spinal cord, sequela: Secondary | ICD-10-CM | POA: Diagnosis not present

## 2018-07-16 DIAGNOSIS — Z87440 Personal history of urinary (tract) infections: Secondary | ICD-10-CM

## 2018-07-16 DIAGNOSIS — L89229 Pressure ulcer of left hip, unspecified stage: Secondary | ICD-10-CM | POA: Diagnosis not present

## 2018-07-16 LAB — URINALYSIS, ROUTINE W REFLEX MICROSCOPIC
Bilirubin Urine: NEGATIVE
Glucose, UA: NEGATIVE mg/dL
Ketones, ur: NEGATIVE mg/dL
Nitrite: NEGATIVE
Protein, ur: 100 mg/dL — AB
Specific Gravity, Urine: 1.019 (ref 1.005–1.030)
WBC, UA: >50 WBC/hpf — ABNORMAL HIGH (ref 0–5)
pH: 5 (ref 5.0–8.0)

## 2018-07-16 LAB — COMPREHENSIVE METABOLIC PANEL
ALT: 23 U/L (ref 0–44)
AST: 25 U/L (ref 15–41)
Albumin: 2.2 g/dL — ABNORMAL LOW (ref 3.5–5.0)
Alkaline Phosphatase: 190 U/L — ABNORMAL HIGH (ref 38–126)
Anion gap: 10 (ref 5–15)
BUN: 23 mg/dL — ABNORMAL HIGH (ref 6–20)
CO2: 24 mmol/L (ref 22–32)
Calcium: 9.4 mg/dL (ref 8.9–10.3)
Chloride: 102 mmol/L (ref 98–111)
Creatinine, Ser: 1.32 mg/dL — ABNORMAL HIGH (ref 0.61–1.24)
GFR calc Af Amer: >60 mL/min (ref >60)
GFR calc non Af Amer: >60 mL/min (ref >60)
Glucose, Bld: 101 mg/dL — ABNORMAL HIGH (ref 70–99)
Potassium: 4.8 mmol/L (ref 3.5–5.1)
Sodium: 136 mmol/L (ref 135–145)
Total Bilirubin: 0.7 mg/dL (ref 0.3–1.2)
Total Protein: 9.3 g/dL — ABNORMAL HIGH (ref 6.5–8.1)

## 2018-07-16 LAB — CBC WITH DIFFERENTIAL/PLATELET
Abs Immature Granulocytes: 0.2 10*3/uL — ABNORMAL HIGH (ref 0.0–0.1)
Basophils Absolute: 0.1 10*3/uL (ref 0.0–0.1)
Basophils Relative: 1 %
Eosinophils Absolute: 0.4 10*3/uL (ref 0.0–0.7)
Eosinophils Relative: 2 %
HCT: 27.1 % — ABNORMAL LOW (ref 39.0–52.0)
Hemoglobin: 8.1 g/dL — ABNORMAL LOW (ref 13.0–17.0)
Immature Granulocytes: 1 %
Lymphocytes Relative: 17 %
Lymphs Abs: 3.2 10*3/uL (ref 0.7–4.0)
MCH: 21.1 pg — ABNORMAL LOW (ref 26.0–34.0)
MCHC: 29.9 g/dL — ABNORMAL LOW (ref 30.0–36.0)
MCV: 70.6 fL — ABNORMAL LOW (ref 78.0–100.0)
Monocytes Absolute: 1.4 10*3/uL — ABNORMAL HIGH (ref 0.1–1.0)
Monocytes Relative: 8 %
Neutro Abs: 13.2 10*3/uL — ABNORMAL HIGH (ref 1.7–7.7)
Neutrophils Relative %: 71 %
Platelets: 675 10*3/uL — ABNORMAL HIGH (ref 150–400)
RBC: 3.84 MIL/uL — ABNORMAL LOW (ref 4.22–5.81)
RDW: 16.8 % — ABNORMAL HIGH (ref 11.5–15.5)
WBC: 18.4 10*3/uL — ABNORMAL HIGH (ref 4.0–10.5)

## 2018-07-16 MED ORDER — OXYCODONE HCL ER 10 MG PO T12A
40.0000 mg | EXTENDED_RELEASE_TABLET | Freq: Two times a day (BID) | ORAL | Status: DC
  Administered 2018-07-17 – 2018-07-22 (×13): 40 mg via ORAL
  Filled 2018-07-16 (×13): qty 4

## 2018-07-16 MED ORDER — VANCOMYCIN HCL IN DEXTROSE 1-5 GM/200ML-% IV SOLN: 1000.0000 mg | Freq: Three times a day (TID) | INTRAVENOUS | Status: DC

## 2018-07-16 MED ORDER — ZINC SULFATE 220 (50 ZN) MG PO CAPS
220.0000 mg | ORAL_CAPSULE | Freq: Every day | ORAL | Status: DC
  Administered 2018-07-17 – 2018-08-01 (×15): 220 mg via ORAL
  Filled 2018-07-16 (×16): qty 1

## 2018-07-16 MED ORDER — SODIUM CHLORIDE 0.9 % IV SOLN
INTRAVENOUS | Status: DC
  Administered 2018-07-17 – 2018-07-24 (×13): via INTRAVENOUS

## 2018-07-16 MED ORDER — ENSURE ENLIVE PO LIQD
237.0000 mL | Freq: Three times a day (TID) | ORAL | Status: DC
  Administered 2018-07-17 – 2018-08-01 (×38): 237 mL via ORAL

## 2018-07-16 MED ORDER — OXYBUTYNIN CHLORIDE 5 MG PO TABS
5.0000 mg | ORAL_TABLET | Freq: Three times a day (TID) | ORAL | Status: DC
  Administered 2018-07-17 – 2018-08-01 (×43): 5 mg via ORAL
  Filled 2018-07-16 (×45): qty 1

## 2018-07-16 MED ORDER — DOCUSATE SODIUM 100 MG PO CAPS
100.0000 mg | ORAL_CAPSULE | Freq: Two times a day (BID) | ORAL | Status: DC
  Administered 2018-07-17 – 2018-07-29 (×17): 100 mg via ORAL
  Filled 2018-07-16 (×23): qty 1

## 2018-07-16 MED ORDER — METRONIDAZOLE 500 MG PO TABS
500.0000 mg | ORAL_TABLET | Freq: Three times a day (TID) | ORAL | Status: DC
  Administered 2018-07-17 – 2018-07-24 (×22): 500 mg via ORAL
  Filled 2018-07-16 (×22): qty 1

## 2018-07-16 MED ORDER — OXYCODONE-ACETAMINOPHEN 5-325 MG PO TABS: 1.0000 | ORAL_TABLET | Freq: Three times a day (TID) | ORAL | Status: DC | PRN

## 2018-07-16 MED ORDER — LIDOCAINE VISCOUS HCL 2 % MT SOLN
15.0000 mL | Freq: Once | OROMUCOSAL | Status: AC
  Administered 2018-07-16: 15 mL via OROMUCOSAL
  Filled 2018-07-16: qty 15

## 2018-07-16 MED ORDER — SODIUM CHLORIDE 0.9 % IV SOLN
1.0000 g | Freq: Three times a day (TID) | INTRAVENOUS | Status: DC
  Filled 2018-07-16 (×2): qty 1

## 2018-07-16 MED ORDER — OXYCODONE HCL 5 MG PO TABS: 5.0000 mg | ORAL_TABLET | Freq: Three times a day (TID) | ORAL | Status: DC | PRN

## 2018-07-16 MED ORDER — SODIUM CHLORIDE 0.9 % IV BOLUS
1000.0000 mL | Freq: Once | INTRAVENOUS | Status: AC
  Administered 2018-07-17: 1000 mL via INTRAVENOUS

## 2018-07-16 MED ORDER — SODIUM CHLORIDE 0.9 % IV SOLN: 2.0000 g | INTRAVENOUS | Status: DC

## 2018-07-16 MED ORDER — SODIUM CHLORIDE 0.9 % IV BOLUS (SEPSIS)
1000.0000 mL | Freq: Once | INTRAVENOUS | Status: AC
  Administered 2018-07-16: 1000 mL via INTRAVENOUS

## 2018-07-16 MED ORDER — OXYCODONE-ACETAMINOPHEN 10-325 MG PO TABS: 1.0000 | ORAL_TABLET | Freq: Three times a day (TID) | ORAL | Status: DC | PRN

## 2018-07-16 MED ORDER — BACLOFEN 20 MG PO TABS: 40.0000 mg | ORAL_TABLET | Freq: Two times a day (BID) | ORAL | Status: DC

## 2018-07-16 MED ORDER — SODIUM CHLORIDE 0.9 % IV SOLN
2.0000 g | Freq: Once | INTRAVENOUS | Status: AC
  Administered 2018-07-16: 2 g via INTRAVENOUS
  Filled 2018-07-16: qty 2

## 2018-07-16 MED ORDER — POLYETHYLENE GLYCOL 3350 17 G PO PACK: 17.0000 g | PACK | Freq: Every day | ORAL | Status: DC | PRN

## 2018-07-16 MED ORDER — VANCOMYCIN HCL IN DEXTROSE 1-5 GM/200ML-% IV SOLN
1000.0000 mg | Freq: Once | INTRAVENOUS | Status: DC
  Filled 2018-07-16: qty 200

## 2018-07-16 MED ORDER — PANTOPRAZOLE SODIUM 40 MG PO TBEC
40.0000 mg | DELAYED_RELEASE_TABLET | Freq: Every day | ORAL | Status: DC
  Administered 2018-07-17 – 2018-08-01 (×14): 40 mg via ORAL
  Filled 2018-07-16 (×16): qty 1

## 2018-07-16 MED ORDER — VANCOMYCIN HCL IN DEXTROSE 1-5 GM/200ML-% IV SOLN: 1000.0000 mg | Freq: Once | INTRAVENOUS | Status: DC

## 2018-07-16 MED ORDER — PROMETHAZINE HCL 25 MG PO TABS
12.5000 mg | ORAL_TABLET | Freq: Four times a day (QID) | ORAL | Status: DC | PRN
  Administered 2018-07-18: 12.5 mg via ORAL
  Filled 2018-07-16: qty 1

## 2018-07-16 MED ORDER — HEPARIN SODIUM (PORCINE) 5000 UNIT/ML IJ SOLN
5000.0000 [IU] | Freq: Three times a day (TID) | INTRAMUSCULAR | Status: DC
  Administered 2018-07-17 – 2018-07-19 (×8): 5000 [IU] via SUBCUTANEOUS
  Filled 2018-07-16 (×8): qty 1

## 2018-07-16 MED ORDER — PREGABALIN 100 MG PO CAPS
300.0000 mg | ORAL_CAPSULE | Freq: Two times a day (BID) | ORAL | Status: DC
  Administered 2018-07-17 – 2018-07-24 (×15): 300 mg via ORAL
  Filled 2018-07-16: qty 3
  Filled 2018-07-16 (×2): qty 6
  Filled 2018-07-16 (×3): qty 3
  Filled 2018-07-16: qty 6
  Filled 2018-07-16 (×6): qty 3
  Filled 2018-07-16 (×3): qty 6

## 2018-07-16 MED ORDER — HYDROCODONE-ACETAMINOPHEN 5-325 MG PO TABS
1.0000 | ORAL_TABLET | Freq: Once | ORAL | Status: DC
  Filled 2018-07-16: qty 1

## 2018-07-16 MED ORDER — VANCOMYCIN HCL 10 G IV SOLR
2000.0000 mg | Freq: Once | INTRAVENOUS | Status: AC
  Administered 2018-07-16: 2000 mg via INTRAVENOUS
  Filled 2018-07-16: qty 2000

## 2018-07-16 MED ORDER — METRONIDAZOLE IN NACL 5-0.79 MG/ML-% IV SOLN
500.0000 mg | Freq: Three times a day (TID) | INTRAVENOUS | Status: DC
  Administered 2018-07-16: 500 mg via INTRAVENOUS
  Filled 2018-07-16 (×2): qty 100

## 2018-07-16 MED ORDER — VANCOMYCIN HCL IN DEXTROSE 1-5 GM/200ML-% IV SOLN
1000.0000 mg | Freq: Three times a day (TID) | INTRAVENOUS | Status: DC
  Administered 2018-07-17 – 2018-07-19 (×7): 1000 mg via INTRAVENOUS
  Filled 2018-07-16 (×9): qty 200

## 2018-07-16 MED ORDER — DULOXETINE HCL 60 MG PO CPEP
60.0000 mg | ORAL_CAPSULE | Freq: Two times a day (BID) | ORAL | Status: DC
  Administered 2018-07-17 – 2018-08-01 (×31): 60 mg via ORAL
  Filled 2018-07-16 (×32): qty 1

## 2018-07-16 NOTE — Progress Notes (Signed)
Pharmacy Antibiotic Note  Sean Horton is a 32 y.o. male admitted on 07/16/2018 with wound infection.  Pharmacy has been consulted for cefepime and vancomycin dosing.  Plan: Vancomycin 2000mg  loading dose, 1000mg  given in ED.  Vancomycin 1000 IV every 8 hours.  Goal trough 10-15 mcg/mL.  Cefepime 2g IV x1: Loading dose. Cefepime 1g IV every 8 hours  Monitor signs and symptoms of infection, WBC, temperature, clinical improvement, cultures and sensitivity.   Height: 6\' 5"  (195.6 cm) Weight: 199 lb 15.3 oz (90.7 kg) IBW/kg (Calculated) : 89.1  Temp (24hrs), Avg:100.6 F (38.1 C), Min:99 F (37.2 C), Max:102.2 F (39 C)  Recent Labs  Lab 07/16/18 1711  WBC 18.4*  CREATININE 1.32*    Estimated Creatinine Clearance: 100.3 mL/min (A) (by C-G formula based on SCr of 1.32 mg/dL (H)).  Baseline Scr ~1.06  No Known Allergies  Antimicrobials this admission: 8/20 Vancomycin >>  8/20  Cefepime >>  Dose adjustments this admission:   Microbiology results: 8/20 BCx: sent  Thank you for allowing pharmacy to be a part of this patient's care.  Tamela Gammon, PharmD PGY1 Pharmacy Resident Please look at [facility] for current pharmacy phone numbers. 07/16/2018 6:47 PM

## 2018-07-16 NOTE — Assessment & Plan Note (Signed)
Patient presents with left hip draining ulcer for the past two weeks. He also endorses fever, chills and decrease appetite. Today in clinic, patient is tachycardic and febrile concerning for sepsis given wound. Patient has had prior admission for IV antibiotic in the setting of wound infection. Based on my clinical assessment there is concerned for possible osteomyelitis. Will send patient to ED for admission. He will need cultures, imaging, basic lab work and antibiotic and would most likely need admission for further management.
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
## 2018-07-16 NOTE — ED Notes (Signed)
Report attempted 

## 2018-07-16 NOTE — ED Notes (Signed)
E-link called questioning lack of antibiotics, provider PA Lawyer notified.
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
## 2018-07-16 NOTE — ED Notes (Signed)
Pt refused Vicodin, requesting 5% lidocaine
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
## 2018-07-16 NOTE — ED Notes (Signed)
Foley leg bag changed to collect sample
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
## 2018-07-16 NOTE — ED Notes (Signed)
Lawyer, PA aware pt requesting lidocaine for tooth pain.  Waiting for order.Marland KitchenMarland Kitchen

## 2018-07-16 NOTE — ED Notes (Signed)
Report attempted.  Informed that charge Rn on the phone trying to get pt transferred to orthopedic floor and to try back

## 2018-07-17 ENCOUNTER — Inpatient Hospital Stay (HOSPITAL_COMMUNITY): Payer: Medicaid Other

## 2018-07-17 LAB — CBC
HCT: 22.8 % — ABNORMAL LOW (ref 39.0–52.0)
HEMOGLOBIN: 6.7 g/dL — AB (ref 13.0–17.0)
MCH: 20.7 pg — ABNORMAL LOW (ref 26.0–34.0)
MCHC: 29.4 g/dL — ABNORMAL LOW (ref 30.0–36.0)
MCV: 70.6 fL — ABNORMAL LOW (ref 78.0–100.0)
Platelets: 539 10*3/uL — ABNORMAL HIGH (ref 150–400)
RBC: 3.23 MIL/uL — AB (ref 4.22–5.81)
RDW: 16.5 % — ABNORMAL HIGH (ref 11.5–15.5)
WBC: 14.3 10*3/uL — ABNORMAL HIGH (ref 4.0–10.5)

## 2018-07-17 LAB — PREPARE RBC (CROSSMATCH)

## 2018-07-17 LAB — COMPREHENSIVE METABOLIC PANEL
ALK PHOS: 146 U/L — AB (ref 38–126)
ALT: 17 U/L (ref 0–44)
ANION GAP: 5 (ref 5–15)
AST: 46 U/L — ABNORMAL HIGH (ref 15–41)
Albumin: 1.7 g/dL — ABNORMAL LOW (ref 3.5–5.0)
BILIRUBIN TOTAL: 0.5 mg/dL (ref 0.3–1.2)
BUN: 20 mg/dL (ref 6–20)
CALCIUM: 8.2 mg/dL — AB (ref 8.9–10.3)
CO2: 22 mmol/L (ref 22–32)
Chloride: 110 mmol/L (ref 98–111)
Creatinine, Ser: 1.02 mg/dL (ref 0.61–1.24)
GFR calc Af Amer: >60 mL/min (ref >60)
GFR calc non Af Amer: >60 mL/min (ref >60)
Glucose, Bld: 212 mg/dL — ABNORMAL HIGH (ref 70–99)
POTASSIUM: 4.3 mmol/L (ref 3.5–5.1)
SODIUM: 137 mmol/L (ref 135–145)
Total Protein: 7.8 g/dL (ref 6.5–8.1)

## 2018-07-17 LAB — HIV ANTIBODY (ROUTINE TESTING W REFLEX): HIV SCREEN 4TH GENERATION: NONREACTIVE

## 2018-07-17 LAB — FERRITIN: FERRITIN: 326 ng/mL (ref 24–336)

## 2018-07-17 LAB — CG4 I-STAT (LACTIC ACID)
Lactic Acid, Venous: 1.12 mmol/L (ref 0.5–1.9)
Lactic Acid, Venous: 1.16 mmol/L (ref 0.5–1.9)

## 2018-07-17 LAB — VITAMIN B12: Vitamin B-12: 232 pg/mL (ref 180–914)

## 2018-07-17 LAB — RETICULOCYTES
RBC.: 3.33 MIL/uL — ABNORMAL LOW (ref 4.22–5.81)
Retic Count, Absolute: 43.3 10*3/uL (ref 19.0–186.0)
Retic Ct Pct: 1.3 % (ref 0.4–3.1)

## 2018-07-17 LAB — HEMOGLOBIN AND HEMATOCRIT, BLOOD
HCT: 23.6 % — ABNORMAL LOW (ref 39.0–52.0)
HEMOGLOBIN: 7 g/dL — AB (ref 13.0–17.0)

## 2018-07-17 LAB — MRSA PCR SCREENING: MRSA BY PCR: NEGATIVE

## 2018-07-17 LAB — IRON AND TIBC
IRON: 15 ug/dL — AB (ref 45–182)
Saturation Ratios: 9 % — ABNORMAL LOW (ref 17.9–39.5)
TIBC: 175 ug/dL — ABNORMAL LOW (ref 250–450)
UIBC: 160 ug/dL

## 2018-07-17 LAB — FOLATE: Folate: 19.6 ng/mL (ref >5.9)

## 2018-07-17 MED ORDER — SODIUM CHLORIDE 0.9 % IV SOLN
2.0000 g | Freq: Three times a day (TID) | INTRAVENOUS | Status: DC
  Filled 2018-07-17 (×2): qty 2

## 2018-07-17 MED ORDER — OXYCODONE-ACETAMINOPHEN 5-325 MG PO TABS
1.0000 | ORAL_TABLET | Freq: Once | ORAL | Status: AC
  Administered 2018-07-17: 1 via ORAL
  Filled 2018-07-17: qty 1

## 2018-07-17 MED ORDER — ADULT MULTIVITAMIN W/MINERALS CH
1.0000 | ORAL_TABLET | Freq: Every day | ORAL | Status: DC
  Administered 2018-07-17 – 2018-08-01 (×15): 1 via ORAL
  Filled 2018-07-17 (×16): qty 1

## 2018-07-17 MED ORDER — GADOBENATE DIMEGLUMINE 529 MG/ML IV SOLN
19.0000 mL | Freq: Once | INTRAVENOUS | Status: AC
  Administered 2018-07-17: 19 mL via INTRAVENOUS

## 2018-07-17 MED ORDER — SODIUM CHLORIDE 0.9 % IV SOLN
1.0000 g | Freq: Three times a day (TID) | INTRAVENOUS | Status: DC
  Administered 2018-07-17 – 2018-07-26 (×28): 1 g via INTRAVENOUS
  Filled 2018-07-17 (×31): qty 1

## 2018-07-17 MED ORDER — SODIUM CHLORIDE 0.9% IV SOLUTION
Freq: Once | INTRAVENOUS | Status: AC
  Administered 2018-07-17: 11:00:00 via INTRAVENOUS

## 2018-07-17 MED ORDER — JUVEN PO PACK
1.0000 | PACK | Freq: Two times a day (BID) | ORAL | Status: DC
  Administered 2018-07-18 (×2): 1 via ORAL
  Filled 2018-07-17 (×7): qty 1

## 2018-07-17 MED ORDER — BACLOFEN 20 MG PO TABS
40.0000 mg | ORAL_TABLET | Freq: Two times a day (BID) | ORAL | Status: DC
  Administered 2018-07-17 – 2018-07-24 (×13): 40 mg via ORAL
  Filled 2018-07-17 (×14): qty 2

## 2018-07-17 MED ORDER — BACLOFEN 20 MG PO TABS
40.0000 mg | ORAL_TABLET | Freq: Every day | ORAL | Status: DC
Start: 2018-07-18 — End: 2018-07-17
  Filled 2018-07-17: qty 2

## 2018-07-17 MED ORDER — OXYCODONE HCL 5 MG PO TABS
5.0000 mg | ORAL_TABLET | ORAL | Status: DC | PRN
  Administered 2018-07-17 – 2018-07-18 (×5): 5 mg via ORAL
  Filled 2018-07-17 (×5): qty 1

## 2018-07-17 NOTE — Progress Notes (Addendum)
CRITICAL VALUE ALERT  Critical Value:  Hgb=6.7  Date & Time Notied:  07/17/18; 0622  Provider Notified: FMTS on-call pager at [phone]  Orders Received/Actions taken: Awaiting orders   On-call FMTS, Dr. Sandi Carne called back at [phone] and ordered for repeat H & H..

## 2018-07-17 NOTE — Consult Note (Signed)
[facility] Nurse wound consult note Patient evaluated in [facility] [phone].  No family present.  The patient describes a variety of wound care approaches, and admits to being "slack" with his wound care over the last 2 weeks because he felt bad.  The patient is on a low air loss mattress. Reason for Consult: Lower extremity wound per consult order Wound type: Patient has multiple wounds, all pressure related, all chronic and non-healing.  The left trochanter is a stage 4 and has a gauze dressing over the wound area that is heavily saturated with purulent appearing drainage that is yellow/tan in color.  The actual opening measures 4.8 cm x 1.8 cm x 8 cm.  It feels as if the tip of the cotton applicator touched bone during insertion into the wound.  The wound is surrounded by hypopigmented, re-epithelialized, contracted tissue.    The left lateral malleolus wound measures 4.3 cm x 4.8 cm x unknown depth.  This is an unstagable wound due to the fact the entire wound bed is dry, yellow, slough.  The wound has a dry gauze dressing over it.  The right lateral malleolus and lower leg has a wound that measures 13 cm x 2 cm x unknown depth.  This is an unstagable wound due to the fact that the entire wound bed is dry brown slough with a central area that is black eschar. The wound has a dry gauze dressing over it.  None of the wounds have erythema or induration.  There is not any drainage to the lower leg wounds. Pressure Injury POA: Yes Dressing procedure/placement/frequency:  For the left trochanter, pack the wound with 1/2 inch Iodoform gauze Kellie Simmering 883).  Cover with an ABD pad.  Change daily. For the bilateral lateral malleolar wounds, apply hydrocolloid dressings to the wounds Kellie Simmering 652).  Use multiple of these for the right lateral lower leg.  These can stay on up to 5 days.  Date the dressings.  Change every 5 days and prn.  Monitor the wound area(s) for worsening of condition such as: Signs/symptoms of infection,   Increase in size,  Development of or worsening of odor, Development of pain, or increased pain at the affected locations.  Notify the medical team if any of these develop.  Thank you for the consult.  Discussed plan of care with the patient and bedside nurse.  [facility] nurse will not follow at this time.  Please re-consult the [facility] team if needed.  Val Riles, RN, MSN, CWOCN, CNS-BC, pager [phone]

## 2018-07-17 NOTE — Care Management Note (Addendum)
Case Management Note  Patient Details  Name: Sean Horton MRN: [id number] Date of Birth: 1986-08-13  Subjective/Objective:                    Action/Plan:Dan with [facility] following. Patient from home alone. Has had [facility] in the past and and needed would like their services again.  Patient has wheelchair which is at [facility] , patient was told they will bring wheelchair here.   Patient may needed transportation home via [facility], face sheet information confirmed.   SW will see patient to give him SCAT application and Medicaid transportation applications.     Confirmed patient has Medicaid and medications are $3 co pay.    Patient lives alone , states he doesn't need any assistance at home. His family visits on weekends and do errands for him ( EX shopping ).   Will continue to follow. Expected Discharge Date:                  Expected Discharge Plan:  [place]  In-House Referral:     Discharge planning Services  CM Consult  Post Acute Care Choice:  Home Health Choice offered to:  Patient  DME Arranged:  N/A DME Agency:  NA  HH Arranged:    [place] Agency:  [facility]  Status of Service:  In process, will continue to follow  If discussed at Long Length of Stay Meetings, dates discussed:    Additional Comments:  Marilu Favre, RN 07/17/2018, 9:47 AM

## 2018-07-17 NOTE — Progress Notes (Deleted)
Pharmacy Antibiotic Note  Sean Horton is a 32 y.o. male admitted on 07/16/2018 with osteomyelitis.  Pharmacy has been consulted for cefepime dosing.  Plan: Cefepime 2gm IV q8 hours F/u renal function, cultures and clinical course  Height: 6\' 5"  (195.6 cm) Weight: 199 lb 15.3 oz (90.7 kg) IBW/kg (Calculated) : 89.1  Temp (24hrs), Avg:100 F (37.8 C), Min:98.8 F (37.1 C), Max:102.2 F (39 C)  Recent Labs  Lab 07/16/18 1711  WBC 18.4*  CREATININE 1.32*    Estimated Creatinine Clearance: 100.3 mL/min (A) (by C-G formula based on SCr of 1.32 mg/dL (H)).    No Known Allergies   Thank you for allowing pharmacy to be a part of this patient's care.  Excell Seltzer [place] 07/17/2018 12:14 AM

## 2018-07-17 NOTE — Social Work (Addendum)
CSW acknowledging consult for "access meds for discharge" for medication support please consult RN Case Manager. CSW will give hand off to RN Case Manager Nira Conn, pt likely will qualify for [facility] program at discharge should they need it.   12:35pm- Aware pt does have insurance through [facility], will not qualify for [facility] program, will meet with pt to discuss pt transportation.   [place], [place] Work [phone]
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
## 2018-07-17 NOTE — Consult Note (Signed)
[facility] Nurse wound consult note Exam of sacral area completed in 6N 05.  No family present Sacrum has a contracted area that is hypopigmented.  The entire area is healed except for a spot the size of the head of a cotton tipped applicator.  The patient states he does not put anything on this area because it is as healed as it is going to get.  I agree the area does not need a dressing. Val Riles, RN, MSN, CWOCN, CNS-BC, pager [phone]

## 2018-07-17 NOTE — Progress Notes (Signed)
Received pt from ED, AOx4, indwelling foley cathether, elevated BP at 150/94 d/t pain at 8/10, noted to have malodorous draining wounds at left hip, left lateral lower foot, right lateral lower leg and sacrum, paraplegia and O2Sat at 100% on RA. Placed on telemetry, oriented to room, bed controls and call light.  Administered scheduled pain medications.  Patient now resting on bed with air matress with both eyes closed.  Will monitor.

## 2018-07-18 ENCOUNTER — Encounter (HOSPITAL_COMMUNITY): Payer: Self-pay | Admitting: General Practice

## 2018-07-18 ENCOUNTER — Other Ambulatory Visit: Payer: Self-pay

## 2018-07-18 LAB — BASIC METABOLIC PANEL
ANION GAP: 7 (ref 5–15)
BUN: 14 mg/dL (ref 6–20)
CHLORIDE: 108 mmol/L (ref 98–111)
CO2: 24 mmol/L (ref 22–32)
Calcium: 8.9 mg/dL (ref 8.9–10.3)
Creatinine, Ser: 0.87 mg/dL (ref 0.61–1.24)
GFR calc non Af Amer: >60 mL/min (ref >60)
Glucose, Bld: 121 mg/dL — ABNORMAL HIGH (ref 70–99)
Potassium: 4.3 mmol/L (ref 3.5–5.1)
Sodium: 139 mmol/L (ref 135–145)

## 2018-07-18 LAB — CBC WITH DIFFERENTIAL/PLATELET
ABS IMMATURE GRANULOCYTES: 0.1 10*3/uL (ref 0.0–0.1)
Basophils Absolute: 0.1 10*3/uL (ref 0.0–0.1)
Basophils Relative: 1 %
Eosinophils Absolute: 0.9 10*3/uL — ABNORMAL HIGH (ref 0.0–0.7)
Eosinophils Relative: 6 %
HEMATOCRIT: 27.7 % — AB (ref 39.0–52.0)
Hemoglobin: 8.4 g/dL — ABNORMAL LOW (ref 13.0–17.0)
IMMATURE GRANULOCYTES: 1 %
LYMPHS ABS: 3 10*3/uL (ref 0.7–4.0)
LYMPHS PCT: 20 %
MCH: 22.6 pg — ABNORMAL LOW (ref 26.0–34.0)
MCHC: 30.3 g/dL (ref 30.0–36.0)
MCV: 74.5 fL — AB (ref 78.0–100.0)
MONOS PCT: 8 %
Monocytes Absolute: 1.2 10*3/uL — ABNORMAL HIGH (ref 0.1–1.0)
NEUTROS ABS: 9.6 10*3/uL — AB (ref 1.7–7.7)
NEUTROS PCT: 64 %
Platelets: 535 10*3/uL — ABNORMAL HIGH (ref 150–400)
RBC: 3.72 MIL/uL — ABNORMAL LOW (ref 4.22–5.81)
RDW: 19.5 % — ABNORMAL HIGH (ref 11.5–15.5)
WBC: 14.9 10*3/uL — ABNORMAL HIGH (ref 4.0–10.5)

## 2018-07-18 LAB — SEDIMENTATION RATE: SED RATE: 135 mm/h — AB (ref 0–16)

## 2018-07-18 LAB — C-REACTIVE PROTEIN: CRP: 18.8 mg/dL — ABNORMAL HIGH (ref <1.0)

## 2018-07-18 MED ORDER — OXYCODONE HCL 5 MG PO TABS: 5.0000 mg | ORAL_TABLET | Freq: Three times a day (TID) | ORAL | Status: DC

## 2018-07-18 MED ORDER — OXYCODONE-ACETAMINOPHEN 5-325 MG PO TABS
1.0000 | ORAL_TABLET | Freq: Three times a day (TID) | ORAL | Status: DC | PRN
  Administered 2018-07-18 – 2018-07-23 (×12): 1 via ORAL
  Filled 2018-07-18 (×12): qty 1

## 2018-07-18 MED ORDER — OXYCODONE HCL 5 MG PO TABS
5.0000 mg | ORAL_TABLET | Freq: Three times a day (TID) | ORAL | Status: DC | PRN
  Administered 2018-07-20 – 2018-07-23 (×8): 5 mg via ORAL
  Filled 2018-07-18 (×8): qty 1

## 2018-07-18 MED ORDER — OXYCODONE-ACETAMINOPHEN 10-325 MG PO TABS: 1.0000 | ORAL_TABLET | Freq: Three times a day (TID) | ORAL | Status: DC | PRN

## 2018-07-18 MED ORDER — IBUPROFEN 400 MG PO TABS
400.0000 mg | ORAL_TABLET | Freq: Three times a day (TID) | ORAL | Status: DC | PRN
  Administered 2018-07-18: 400 mg via ORAL
  Filled 2018-07-18: qty 1

## 2018-07-18 MED ORDER — OXYCODONE-ACETAMINOPHEN 5-325 MG PO TABS: 1.0000 | ORAL_TABLET | Freq: Three times a day (TID) | ORAL | Status: DC

## 2018-07-18 MED ORDER — POLYETHYLENE GLYCOL 3350 17 G PO PACK
17.0000 g | PACK | Freq: Every day | ORAL | Status: DC
  Filled 2018-07-18: qty 1
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
## 2018-07-18 NOTE — Progress Notes (Signed)
Patient refused the scheduled dressing change at his left hip wound and request to have it done later since day shift RN yesterday changed it around 1 pm.  Will endorse to day shift RN accordingly.

## 2018-07-18 NOTE — Consult Note (Signed)
Reviewed chart, see extensive note from [facility] consultation yesterday 07/17/18.  No topical wound care ordered for sacrum/back at this time. Hyperpigmented skin with pinpoint opening that would not require wound care. Consulted discontinued. No new wounds per bedside nurse.  WTA on the unit for PIP data collections as well, will notify this [place] nurse if anything is new or changed.   [place], [place], [place]

## 2018-07-18 NOTE — Progress Notes (Signed)
Patient ID: Sean Horton, male   DOB: Jul 28, 1986, 32 y.o.   MRN: [id number]  No emergent indication for I&D tonight. Will see in am to determine best course of action.    Lisette Abu, PA-C Orthopedic Surgery [phone]

## 2018-07-18 NOTE — Consult Note (Addendum)
[facility] Nurse wound consult note Pt having spasms in legs, refusing visit presently due to inability to turn with active spasms. Bedside RN, Jearld Fenton, RN, to call me when pt will turn for assessment. Pt consulted yesterday for other wounds, orders are in place. No orders placed for sacral wound because it was determined that it did not need a dressing. Will return when pt allows to assess if there is a different wound not addressed yesterday.   Fara Olden, RN-C, WTA-C, [place] [facility] Associate

## 2018-07-19 ENCOUNTER — Encounter (HOSPITAL_COMMUNITY)
Admission: EM | Disposition: A | Discharge status: Home Health | Payer: Self-pay | Source: Ambulatory Visit | Attending: Family Medicine

## 2018-07-19 ENCOUNTER — Encounter (HOSPITAL_COMMUNITY): Payer: Self-pay | Admitting: *Deleted

## 2018-07-19 ENCOUNTER — Inpatient Hospital Stay (HOSPITAL_COMMUNITY): Payer: Medicaid Other | Admitting: Anesthesiology

## 2018-07-19 ENCOUNTER — Inpatient Hospital Stay: Payer: Self-pay

## 2018-07-19 DIAGNOSIS — L89159 Pressure ulcer of sacral region, unspecified stage: Secondary | ICD-10-CM

## 2018-07-19 DIAGNOSIS — G8220 Paraplegia, unspecified: Secondary | ICD-10-CM

## 2018-07-19 DIAGNOSIS — L89229 Pressure ulcer of left hip, unspecified stage: Secondary | ICD-10-CM

## 2018-07-19 DIAGNOSIS — F17290 Nicotine dependence, other tobacco product, uncomplicated: Secondary | ICD-10-CM

## 2018-07-19 DIAGNOSIS — M869 Osteomyelitis, unspecified: Secondary | ICD-10-CM

## 2018-07-19 HISTORY — PX: APPLICATION OF WOUND VAC: SHX5189

## 2018-07-19 HISTORY — PX: I&D EXTREMITY: SHX5045

## 2018-07-19 LAB — CBC WITH DIFFERENTIAL/PLATELET
Abs Immature Granulocytes: 0.1 10*3/uL (ref 0.0–0.1)
BASOS PCT: 1 %
Basophils Absolute: 0.1 10*3/uL (ref 0.0–0.1)
Eosinophils Absolute: 0.8 10*3/uL — ABNORMAL HIGH (ref 0.0–0.7)
Eosinophils Relative: 6 %
HEMATOCRIT: 28 % — AB (ref 39.0–52.0)
Hemoglobin: 8.4 g/dL — ABNORMAL LOW (ref 13.0–17.0)
IMMATURE GRANULOCYTES: 1 %
LYMPHS ABS: 3.2 10*3/uL (ref 0.7–4.0)
Lymphocytes Relative: 25 %
MCH: 22.5 pg — AB (ref 26.0–34.0)
MCHC: 30 g/dL (ref 30.0–36.0)
MCV: 75.1 fL — AB (ref 78.0–100.0)
MONO ABS: 0.8 10*3/uL (ref 0.1–1.0)
Monocytes Relative: 6 %
Neutro Abs: 8.1 10*3/uL — ABNORMAL HIGH (ref 1.7–7.7)
Neutrophils Relative %: 61 %
PLATELETS: 598 10*3/uL — AB (ref 150–400)
RBC: 3.73 MIL/uL — ABNORMAL LOW (ref 4.22–5.81)
RDW: 20.4 % — ABNORMAL HIGH (ref 11.5–15.5)
WBC: 13.1 10*3/uL — ABNORMAL HIGH (ref 4.0–10.5)

## 2018-07-19 LAB — CBC
HCT: 28.7 % — ABNORMAL LOW (ref 39.0–52.0)
HEMOGLOBIN: 8.7 g/dL — AB (ref 13.0–17.0)
MCH: 24.2 pg — AB (ref 26.0–34.0)
MCHC: 30.3 g/dL (ref 30.0–36.0)
MCV: 79.9 fL (ref 78.0–100.0)
Platelets: 502 10*3/uL — ABNORMAL HIGH (ref 150–400)
RBC: 3.59 MIL/uL — ABNORMAL LOW (ref 4.22–5.81)
RDW: 22.1 % — AB (ref 11.5–15.5)
WBC: 16.5 10*3/uL — ABNORMAL HIGH (ref 4.0–10.5)

## 2018-07-19 LAB — BASIC METABOLIC PANEL
Anion gap: 9 (ref 5–15)
BUN: 14 mg/dL (ref 6–20)
CALCIUM: 8.9 mg/dL (ref 8.9–10.3)
CHLORIDE: 109 mmol/L (ref 98–111)
CO2: 22 mmol/L (ref 22–32)
CREATININE: 0.98 mg/dL (ref 0.61–1.24)
GFR calc Af Amer: >60 mL/min (ref >60)
GFR calc non Af Amer: >60 mL/min (ref >60)
GLUCOSE: 84 mg/dL (ref 70–99)
Potassium: 4.3 mmol/L (ref 3.5–5.1)
Sodium: 140 mmol/L (ref 135–145)

## 2018-07-19 LAB — VANCOMYCIN, TROUGH: Vancomycin Tr: 20 ug/mL (ref 15–20)

## 2018-07-19 LAB — PREPARE RBC (CROSSMATCH)

## 2018-07-19 SURGERY — IRRIGATION AND DEBRIDEMENT EXTREMITY
Anesthesia: General | Site: Hip | Laterality: Left

## 2018-07-19 MED ORDER — SUGAMMADEX SODIUM 200 MG/2ML IV SOLN
INTRAVENOUS | Status: DC | PRN
  Administered 2018-07-19: 300 mg via INTRAVENOUS

## 2018-07-19 MED ORDER — TIZANIDINE HCL 4 MG PO TABS
2.0000 mg | ORAL_TABLET | Freq: Every day | ORAL | Status: DC
  Administered 2018-07-20 – 2018-08-01 (×13): 2 mg via ORAL
  Filled 2018-07-19 (×13): qty 1

## 2018-07-19 MED ORDER — PROPOFOL 10 MG/ML IV BOLUS
INTRAVENOUS | Status: AC
  Filled 2018-07-19: qty 20

## 2018-07-19 MED ORDER — ALBUMIN HUMAN 5 % IV SOLN
12.5000 g | Freq: Once | INTRAVENOUS | Status: AC
  Administered 2018-07-19: 12.5 g via INTRAVENOUS

## 2018-07-19 MED ORDER — LIDOCAINE 2% (20 MG/ML) 5 ML SYRINGE
INTRAMUSCULAR | Status: AC
  Filled 2018-07-19: qty 5

## 2018-07-19 MED ORDER — PROPOFOL 10 MG/ML IV BOLUS
INTRAVENOUS | Status: DC | PRN
  Administered 2018-07-19: 50 mg via INTRAVENOUS
  Administered 2018-07-19: 220 mg via INTRAVENOUS

## 2018-07-19 MED ORDER — FENTANYL CITRATE (PF) 100 MCG/2ML IJ SOLN
INTRAMUSCULAR | Status: DC | PRN
  Administered 2018-07-19: 150 ug via INTRAVENOUS
  Administered 2018-07-19: 50 ug via INTRAVENOUS

## 2018-07-19 MED ORDER — SODIUM CHLORIDE 0.9 % IV SOLN
INTRAVENOUS | Status: DC | PRN
  Administered 2018-07-19 – 2018-07-20 (×2): 250 mL via INTRAVENOUS
  Administered 2018-07-20 – 2018-07-22 (×3): 100 mL via INTRAVENOUS

## 2018-07-19 MED ORDER — LACTATED RINGERS IV SOLN
INTRAVENOUS | Status: DC
  Administered 2018-07-19: 12:00:00 via INTRAVENOUS

## 2018-07-19 MED ORDER — HYDROMORPHONE HCL 1 MG/ML IJ SOLN
0.2500 mg | INTRAMUSCULAR | Status: DC | PRN
  Administered 2018-07-19 (×2): 0.5 mg via INTRAVENOUS

## 2018-07-19 MED ORDER — ROCURONIUM BROMIDE 50 MG/5ML IV SOSY
PREFILLED_SYRINGE | INTRAVENOUS | Status: AC
  Filled 2018-07-19: qty 10

## 2018-07-19 MED ORDER — LIDOCAINE 2% (20 MG/ML) 5 ML SYRINGE
INTRAMUSCULAR | Status: DC | PRN
  Administered 2018-07-19: 60 mg via INTRAVENOUS

## 2018-07-19 MED ORDER — 0.9 % SODIUM CHLORIDE (POUR BTL) OPTIME
TOPICAL | Status: DC | PRN
  Administered 2018-07-19: 1000 mL

## 2018-07-19 MED ORDER — PHENYLEPHRINE 40 MCG/ML (10ML) SYRINGE FOR IV PUSH (FOR BLOOD PRESSURE SUPPORT)
PREFILLED_SYRINGE | INTRAVENOUS | Status: DC | PRN
  Administered 2018-07-19: 120 ug via INTRAVENOUS
  Administered 2018-07-19: 80 ug via INTRAVENOUS

## 2018-07-19 MED ORDER — MIDAZOLAM HCL 5 MG/5ML IJ SOLN
INTRAMUSCULAR | Status: DC | PRN
  Administered 2018-07-19: 2 mg via INTRAVENOUS

## 2018-07-19 MED ORDER — CEFAZOLIN SODIUM-DEXTROSE 2-4 GM/100ML-% IV SOLN
INTRAVENOUS | Status: AC
  Filled 2018-07-19: qty 100

## 2018-07-19 MED ORDER — ROCURONIUM BROMIDE 10 MG/ML (PF) SYRINGE
PREFILLED_SYRINGE | INTRAVENOUS | Status: DC | PRN
  Administered 2018-07-19: 20 mg via INTRAVENOUS
  Administered 2018-07-19: 50 mg via INTRAVENOUS
  Administered 2018-07-19: 20 mg via INTRAVENOUS

## 2018-07-19 MED ORDER — CEFAZOLIN SODIUM-DEXTROSE 2-4 GM/100ML-% IV SOLN
2.0000 g | Freq: Once | INTRAVENOUS | Status: DC
  Filled 2018-07-19: qty 100

## 2018-07-19 MED ORDER — ONDANSETRON HCL 4 MG/2ML IJ SOLN
INTRAMUSCULAR | Status: AC
  Filled 2018-07-19: qty 2

## 2018-07-19 MED ORDER — POVIDONE-IODINE 10 % EX SWAB: 2.0000 "application " | Freq: Once | CUTANEOUS | Status: DC

## 2018-07-19 MED ORDER — ALBUMIN HUMAN 5 % IV SOLN
INTRAVENOUS | Status: AC
  Administered 2018-07-19: 12.5 g via INTRAVENOUS
  Filled 2018-07-19: qty 250

## 2018-07-19 MED ORDER — ALBUMIN HUMAN 5 % IV SOLN
INTRAVENOUS | Status: DC | PRN
  Administered 2018-07-19: 14:00:00 via INTRAVENOUS

## 2018-07-19 MED ORDER — DEXAMETHASONE SODIUM PHOSPHATE 10 MG/ML IJ SOLN
INTRAMUSCULAR | Status: DC | PRN
  Administered 2018-07-19: 10 mg via INTRAVENOUS

## 2018-07-19 MED ORDER — MIDAZOLAM HCL 2 MG/2ML IJ SOLN
INTRAMUSCULAR | Status: AC
  Filled 2018-07-19: qty 2

## 2018-07-19 MED ORDER — HYDROMORPHONE HCL 1 MG/ML IJ SOLN
INTRAMUSCULAR | Status: AC
  Administered 2018-07-19: 0.5 mg via INTRAVENOUS
  Filled 2018-07-19: qty 1

## 2018-07-19 MED ORDER — SODIUM CHLORIDE 0.9 % IV SOLN
INTRAVENOUS | Status: DC | PRN
  Administered 2018-07-19: 14:00:00 via INTRAVENOUS

## 2018-07-19 MED ORDER — SODIUM CHLORIDE 0.9 % IV SOLN
INTRAVENOUS | Status: DC | PRN
  Administered 2018-07-19: 25 ug/min via INTRAVENOUS

## 2018-07-19 MED ORDER — PROMETHAZINE HCL 25 MG/ML IJ SOLN: 6.2500 mg | INTRAMUSCULAR | Status: DC | PRN

## 2018-07-19 MED ORDER — PHENYLEPHRINE 40 MCG/ML (10ML) SYRINGE FOR IV PUSH (FOR BLOOD PRESSURE SUPPORT)
PREFILLED_SYRINGE | INTRAVENOUS | Status: AC
  Filled 2018-07-19: qty 10

## 2018-07-19 MED ORDER — SODIUM CHLORIDE 0.9% IV SOLUTION: Freq: Once | INTRAVENOUS | Status: DC

## 2018-07-19 MED ORDER — SODIUM CHLORIDE 0.9 % IR SOLN
Status: DC | PRN
  Administered 2018-07-19: 3000 mL

## 2018-07-19 MED ORDER — LACTATED RINGERS IV SOLN: INTRAVENOUS | Status: DC

## 2018-07-19 MED ORDER — DEXAMETHASONE SODIUM PHOSPHATE 10 MG/ML IJ SOLN
INTRAMUSCULAR | Status: AC
  Filled 2018-07-19: qty 1

## 2018-07-19 MED ORDER — CHLORHEXIDINE GLUCONATE 4 % EX LIQD: 60.0000 mL | Freq: Once | CUTANEOUS | Status: DC

## 2018-07-19 MED ORDER — CEFAZOLIN SODIUM-DEXTROSE 2-3 GM-%(50ML) IV SOLR
INTRAVENOUS | Status: DC | PRN
  Administered 2018-07-19: 2 g via INTRAVENOUS

## 2018-07-19 MED ORDER — VANCOMYCIN HCL IN DEXTROSE 1-5 GM/200ML-% IV SOLN
1000.0000 mg | Freq: Two times a day (BID) | INTRAVENOUS | Status: DC
  Administered 2018-07-19 – 2018-07-24 (×10): 1000 mg via INTRAVENOUS
  Filled 2018-07-19 (×10): qty 200

## 2018-07-19 MED ORDER — LACTATED RINGERS IV SOLN
INTRAVENOUS | Status: DC | PRN
  Administered 2018-07-19: 14:00:00 via INTRAVENOUS

## 2018-07-19 MED ORDER — VANCOMYCIN HCL IN DEXTROSE 1-5 GM/200ML-% IV SOLN
1000.0000 mg | Freq: Two times a day (BID) | INTRAVENOUS | Status: DC
  Filled 2018-07-19: qty 200

## 2018-07-19 MED ORDER — FENTANYL CITRATE (PF) 250 MCG/5ML IJ SOLN
INTRAMUSCULAR | Status: AC
  Filled 2018-07-19: qty 5

## 2018-07-19 SURGICAL SUPPLY — 52 items
BLADE SAW SGTL 18X1.27X75 (BLADE) ×2 IMPLANT
BLADE SAW SGTL 18X1.27X75MM (BLADE) ×1
BNDG COHESIVE 4X5 TAN STRL (GAUZE/BANDAGES/DRESSINGS) ×3 IMPLANT
BNDG GAUZE ELAST 4 BULKY (GAUZE/BANDAGES/DRESSINGS) ×3 IMPLANT
BNDG GAUZE STRTCH 6 (GAUZE/BANDAGES/DRESSINGS) ×9 IMPLANT
BRUSH SCRUB SURG 4.25 DISP (MISCELLANEOUS) ×6 IMPLANT
CANISTER WOUND CARE 500ML ATS (WOUND CARE) ×3 IMPLANT
CASSETTE VERAFLO VERALINK (MISCELLANEOUS) ×3 IMPLANT
CONT SPEC 4OZ CLIKSEAL STRL BL (MISCELLANEOUS) ×3 IMPLANT
COVER SURGICAL LIGHT HANDLE (MISCELLANEOUS) ×6 IMPLANT
DRAPE INCISE IOBAN 66X45 STRL (DRAPES) ×6 IMPLANT
DRAPE U-SHAPE 47X51 STRL (DRAPES) ×3 IMPLANT
DRSG ADAPTIC 3X8 NADH LF (GAUZE/BANDAGES/DRESSINGS) ×3 IMPLANT
DRSG CLEANSE VERAFLOW (GAUZE/BANDAGES/DRESSINGS) ×3 IMPLANT
ELECT BLADE 4.0 EZ CLEAN MEGAD (MISCELLANEOUS) ×3
ELECT REM PT RETURN 9FT ADLT (ELECTROSURGICAL)
ELECTRODE BLDE 4.0 EZ CLN MEGD (MISCELLANEOUS) ×1 IMPLANT
ELECTRODE REM PT RTRN 9FT ADLT (ELECTROSURGICAL) IMPLANT
GAUZE SPONGE 4X4 12PLY STRL (GAUZE/BANDAGES/DRESSINGS) ×3 IMPLANT
GLOVE BIO SURGEON STRL SZ7.5 (GLOVE) ×3 IMPLANT
GLOVE BIO SURGEON STRL SZ8 (GLOVE) ×3 IMPLANT
GLOVE BIOGEL PI IND STRL 7.5 (GLOVE) ×1 IMPLANT
GLOVE BIOGEL PI IND STRL 8 (GLOVE) ×1 IMPLANT
GLOVE BIOGEL PI INDICATOR 7.5 (GLOVE) ×2
GLOVE BIOGEL PI INDICATOR 8 (GLOVE) ×2
GOWN STRL REUS W/ TWL LRG LVL3 (GOWN DISPOSABLE) ×2 IMPLANT
GOWN STRL REUS W/ TWL XL LVL3 (GOWN DISPOSABLE) ×1 IMPLANT
GOWN STRL REUS W/TWL LRG LVL3 (GOWN DISPOSABLE) ×4
GOWN STRL REUS W/TWL XL LVL3 (GOWN DISPOSABLE) ×2
HANDPIECE INTERPULSE COAX TIP (DISPOSABLE)
KIT BASIN OR (CUSTOM PROCEDURE TRAY) ×3 IMPLANT
KIT DRSG PREVENA PLUS 7DAY 125 (MISCELLANEOUS) ×3 IMPLANT
KIT PREVENA INCISION MGT 13 (CANNISTER) ×3 IMPLANT
KIT TURNOVER KIT B (KITS) ×3 IMPLANT
MANIFOLD NEPTUNE II (INSTRUMENTS) ×3 IMPLANT
NS IRRIG 1000ML POUR BTL (IV SOLUTION) ×3 IMPLANT
PACK ORTHO EXTREMITY (CUSTOM PROCEDURE TRAY) ×3 IMPLANT
PAD ARMBOARD 7.5X6 YLW CONV (MISCELLANEOUS) ×6 IMPLANT
PADDING CAST COTTON 6X4 STRL (CAST SUPPLIES) ×3 IMPLANT
SET HNDPC FAN SPRY TIP SCT (DISPOSABLE) IMPLANT
SPONGE LAP 18X18 X RAY DECT (DISPOSABLE) ×9 IMPLANT
STOCKINETTE IMPERVIOUS 9X36 MD (GAUZE/BANDAGES/DRESSINGS) ×3 IMPLANT
SUT ETHILON 4 0 PS 2 18 (SUTURE) ×15 IMPLANT
SUT PDS AB 2-0 CT1 27 (SUTURE) IMPLANT
SWAB CULTURE ESWAB REG 1ML (MISCELLANEOUS) IMPLANT
TOWEL OR 17X24 6PK STRL BLUE (TOWEL DISPOSABLE) ×3 IMPLANT
TOWEL OR 17X26 10 PK STRL BLUE (TOWEL DISPOSABLE) ×6 IMPLANT
TUBE CONNECTING 12'X1/4 (SUCTIONS) ×1
TUBE CONNECTING 12X1/4 (SUCTIONS) ×2 IMPLANT
UNDERPAD 30X30 (UNDERPADS AND DIAPERS) ×3 IMPLANT
WATER STERILE IRR 1000ML POUR (IV SOLUTION) ×3 IMPLANT
YANKAUER SUCT BULB TIP NO VENT (SUCTIONS) ×3 IMPLANT

## 2018-07-19 NOTE — Progress Notes (Signed)
Patient ID: Sean Horton, male   DOB: [birth year], 32 y.o.   MRN: [id number] I assisted Dr. Ginette Pitman with the resection of the proximal femur local tissue rearrangement and application of installation wound VAC.  In the recovery room there was a clog in the tubing from the irrigation.  The wound VAC was transitioned to a regular wound VAC with only suction.    Plan for return to the operating room on Wednesday for irrigation and debridement with pulsatile lavage placement of the cleanse choice dressing with the reticulated foam against the wound and cover with the regular cleanse choice dressing.  The wound will then need to be reapproximated Ioban dressing applied and reconnected to a classic wound VAC suction.    Plan to return to the operating room next Friday.
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
## 2018-07-19 NOTE — Anesthesia Preprocedure Evaluation (Addendum)
Anesthesia Evaluation  Patient identified by MRN, date of birth, ID band Patient awake    Reviewed: Allergy & Precautions, NPO status , Patient's Chart, lab work & pertinent test results  Airway Mallampati: II  TM Distance: >3 FB Neck ROM: Full    Dental  (+) Poor Dentition, Chipped, Dental Advisory Given,    Pulmonary Current Smoker,    Pulmonary exam normal breath sounds clear to auscultation       Cardiovascular negative cardio ROS Normal cardiovascular exam Rhythm:Regular Rate:Normal  ECG: ST, rate 104   Neuro/Psych  Headaches, PSYCHIATRIC DISORDERS T3 spinal cord injury  Paraplegia   Neuromuscular disease    GI/Hepatic Neg liver ROS, GERD  Medicated and Controlled,  Endo/Other  negative endocrine ROS  Renal/GU negative Renal ROS   Neurogenic bladder    Musculoskeletal negative musculoskeletal ROS (+)   Abdominal   Peds  Hematology  (+) anemia ,   Anesthesia Other Findings Left hip infection  Reproductive/Obstetrics                           Anesthesia Physical Anesthesia Plan  ASA: III  Anesthesia Plan: General   Post-op Pain Management:    Induction: Intravenous  PONV Risk Score and Plan: 1 and Ondansetron, Dexamethasone, Treatment may vary due to age or medical condition and Midazolam  Airway Management Planned: Oral ETT  Additional Equipment:   Intra-op Plan:   Post-operative Plan: Extubation in OR  Informed Consent: I have reviewed the patients History and Physical, chart, labs and discussed the procedure including the risks, benefits and alternatives for the proposed anesthesia with the patient or authorized representative who has indicated his/her understanding and acceptance.   Dental advisory given  Plan Discussed with: CRNA  Anesthesia Plan Comments:        Anesthesia Quick Evaluation

## 2018-07-19 NOTE — Brief Op Note (Signed)
07/19/2018  3:53 PM  PATIENT:  Eusebio Friendly  32 y.o. male  PRE-OPERATIVE DIAGNOSIS:  1. Left hip infection 2. Left proximal femur osteomyelitis with chronic draining sinus 3. Left ankle ulcer  POST-OPERATIVE DIAGNOSIS:  1. Left hip infection 2. Left proximal femur osteomyelitis with chronic draining sinus 3. Left ankle ulcer  PROCEDURE:  Procedure(s): 1. PARTIAL EXCISION OF LEFT PROXIMAL FEMUR 2. LOCAL SOFT TISSUE REARRANGEMENT  3. DEBRIDEMENT OF DERMIS AND SUBCUTANEOUS TISSUE LEFT ANKLE 4. APPLICATION OF WOUND VAC (Left) HIP 4. APPLICATION OF WOUND VAC LEFT LATERAL ANKLE (Left)  SURGEON:  Surgeon(s) and Role:    Altamese Coalfield, MD - Primary    * Newt Minion, MD - Assisting  ANESTHESIA:   general  EBL:  1350 mL   DICTATION: .Other Dictation: Dictation Number [id number]  PLAN OF CARE: Admit to inpatient   PATIENT DISPOSITION:  PACU - hemodynamically stable.   Delay start of Pharmacological VTE agent (>24hrs) due to surgical blood loss or risk of bleeding: no

## 2018-07-19 NOTE — Progress Notes (Signed)
Pharmacy Antibiotic Note  Sean Horton is a 32 y.o. male admitted on 07/16/2018 with wound infection.  Pharmacy has been consulted for cefepime and vancomycin dosing.   Level collected approximately 11 hours after last dose, which came back at 20. Scr is 0.98 today. WBC up to 16.5 - did receive dexamethasone today. Afebrile. Underwent I&D today.  Plan: Change vancomycin dose 1g IV every 12 hours. Continue cefepime 1g IV every 8 hours Obtain level at steady state to monitor regimen  Monitor signs and symptoms of infection, WBC, temperature, clinical improvement, cultures and sensitivity.   Height: 6\' 5"  (195.6 cm) Weight: 199 lb 15.3 oz (90.7 kg) IBW/kg (Calculated) : 89.1  Temp (24hrs), Avg:98.4 F (36.9 C), Min:98.1 F (36.7 C), Max:98.8 F (37.1 C)  Recent Labs  Lab 07/16/18 1711 07/16/18 1725 07/16/18 2003 07/17/18 0515 07/18/18 0611 07/19/18 0520 07/19/18 1643  WBC 18.4*  --   --  14.3* 14.9* 13.1* 16.5*  CREATININE 1.32*  --   --  1.02 0.87 0.98  --   LATICACIDVEN  --  1.12 1.16  --   --   --   --   VANCOTROUGH  --   --   --   --   --   --  20    Estimated Creatinine Clearance: 135.1 mL/min (by C-G formula based on SCr of 0.98 mg/dL).  Baseline Scr ~1.06  No Known Allergies  Antimicrobials this admission: 8/20 Vancomycin >>  8/20  Cefepime >>  Dose adjustments this admission: VT 20 (11 hours after last dose) - decrease to 1g IV every 12 hr  Microbiology results: 8/20 BCx: NGTD 8/21 MRSA PCR: neg  Thank you for allowing pharmacy to be a part of this patient's care.  Doylene Canard, PharmD Clinical Pharmacist  Pager: [phone] Phone: [phone] Please look at [facility] for current pharmacy phone numbers. 07/19/2018 6:42 PM

## 2018-07-19 NOTE — Progress Notes (Signed)
RN aware that PICC will not be placed tonight.

## 2018-07-19 NOTE — Anesthesia Procedure Notes (Signed)
Procedure Name: Intubation Date/Time: 07/19/2018 1:05 PM Performed by: Jenne Campus, CRNA Pre-anesthesia Checklist: Patient identified, Emergency Drugs available, Suction available and Patient being monitored Patient Re-evaluated:Patient Re-evaluated prior to induction Oxygen Delivery Method: Circle System Utilized Preoxygenation: Pre-oxygenation with 100% oxygen Induction Type: IV induction Ventilation: Mask ventilation without difficulty Laryngoscope Size: Miller and 3 Grade View: Grade I Tube type: Oral Tube size: 8.0 mm Number of attempts: 1 Airway Equipment and Method: Stylet Placement Confirmation: ETT inserted through vocal cords under direct vision,  positive ETCO2 and breath sounds checked- equal and bilateral Secured at: 23 cm Tube secured with: Tape Dental Injury: Teeth and Oropharynx as per pre-operative assessment

## 2018-07-19 NOTE — Transfer of Care (Signed)
Immediate Anesthesia Transfer of Care Note  Patient: Sean Horton  Procedure(s) Performed: PARTIAL EXCISION OF LEFT PROXIMAL FEMUR, DEBRIDEMENT OF WOUND, APPLICATION OF WOUND VAC (Left Hip) APPLICATION OF WOUND VAC LEFT HIP AND LEFT LATERAL ANKLE (Left Hip)  Patient Location: PACU  Anesthesia Type:General  Level of Consciousness: sedated  Airway & Oxygen Therapy: Patient Spontanous Breathing and Patient connected to nasal cannula oxygen  Post-op Assessment: Report given to RN and Post -op Vital signs reviewed and stable  Post vital signs: Reviewed and stable  Last Vitals:  Vitals Value Taken Time  BP 126/94 07/19/2018  3:23 PM  Temp    Pulse 136 07/19/2018  3:29 PM  Resp 19 07/19/2018  3:29 PM  SpO2 100 % 07/19/2018  3:29 PM  Vitals shown include unvalidated device data.  Last Pain:  Vitals:   07/19/18 0531  TempSrc:   PainSc: 6       Patients Stated Pain Goal: 2 (87/19/59 7471)  Complications: No apparent anesthesia complications

## 2018-07-20 LAB — CBC
HCT: 21.4 % — ABNORMAL LOW (ref 39.0–52.0)
Hemoglobin: 6.5 g/dL — CL (ref 13.0–17.0)
MCH: 24.5 pg — ABNORMAL LOW (ref 26.0–34.0)
MCHC: 30.4 g/dL (ref 30.0–36.0)
MCV: 80.8 fL (ref 78.0–100.0)
Platelets: 416 10*3/uL — ABNORMAL HIGH (ref 150–400)
RBC: 2.65 MIL/uL — AB (ref 4.22–5.81)
RDW: 23.1 % — ABNORMAL HIGH (ref 11.5–15.5)
WBC: 15.2 10*3/uL — ABNORMAL HIGH (ref 4.0–10.5)

## 2018-07-20 LAB — BASIC METABOLIC PANEL
Anion gap: 7 (ref 5–15)
BUN: 15 mg/dL (ref 6–20)
CO2: 22 mmol/L (ref 22–32)
CREATININE: 1 mg/dL (ref 0.61–1.24)
Calcium: 7.9 mg/dL — ABNORMAL LOW (ref 8.9–10.3)
Chloride: 108 mmol/L (ref 98–111)
GFR calc Af Amer: >60 mL/min (ref >60)
Glucose, Bld: 142 mg/dL — ABNORMAL HIGH (ref 70–99)
POTASSIUM: 4 mmol/L (ref 3.5–5.1)
SODIUM: 137 mmol/L (ref 135–145)

## 2018-07-20 LAB — CBC WITH DIFFERENTIAL/PLATELET
BASOS ABS: 0.2 10*3/uL — AB (ref 0.0–0.1)
Basophils Relative: 1 %
Eosinophils Absolute: 0 10*3/uL (ref 0.0–0.7)
Eosinophils Relative: 0 %
HCT: 21.3 % — ABNORMAL LOW (ref 39.0–52.0)
Hemoglobin: 6.5 g/dL — CL (ref 13.0–17.0)
LYMPHS ABS: 3 10*3/uL (ref 0.7–4.0)
Lymphocytes Relative: 18 %
MCH: 24.3 pg — ABNORMAL LOW (ref 26.0–34.0)
MCHC: 30.5 g/dL (ref 30.0–36.0)
MCV: 79.5 fL (ref 78.0–100.0)
MONO ABS: 1.2 10*3/uL — AB (ref 0.1–1.0)
Monocytes Relative: 7 %
Neutro Abs: 12.2 10*3/uL — ABNORMAL HIGH (ref 1.7–7.7)
Neutrophils Relative %: 74 %
PLATELETS: 444 10*3/uL — AB (ref 150–400)
RBC: 2.68 MIL/uL — AB (ref 4.22–5.81)
RDW: 22.5 % — ABNORMAL HIGH (ref 11.5–15.5)
WBC: 16.6 10*3/uL — ABNORMAL HIGH (ref 4.0–10.5)

## 2018-07-20 LAB — PREPARE RBC (CROSSMATCH)

## 2018-07-20 MED ORDER — WHITE PETROLATUM EX OINT
TOPICAL_OINTMENT | CUTANEOUS | Status: AC
  Filled 2018-07-20: qty 28.35

## 2018-07-20 MED ORDER — SODIUM CHLORIDE 0.9% FLUSH
10.0000 mL | INTRAVENOUS | Status: DC | PRN
  Administered 2018-07-23 – 2018-07-27 (×3): 10 mL
  Filled 2018-07-20 (×3): qty 40

## 2018-07-20 MED ORDER — SODIUM CHLORIDE 0.9% IV SOLUTION
Freq: Once | INTRAVENOUS | Status: AC
  Administered 2018-07-20: 17:00:00 via INTRAVENOUS

## 2018-07-20 MED ORDER — POLYETHYLENE GLYCOL 3350 17 G PO PACK
17.0000 g | PACK | Freq: Two times a day (BID) | ORAL | Status: DC
  Administered 2018-07-24 – 2018-08-01 (×15): 17 g via ORAL
  Filled 2018-07-20 (×21): qty 1

## 2018-07-20 NOTE — Progress Notes (Signed)
Spoke with Ronalee Belts RN re PICC to be placed for home antibiotics.  Notified PICC may not be placed today.
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
## 2018-07-20 NOTE — Progress Notes (Signed)
[facility] will be providing [facility] services for pt and [facility] services at DC to home. [facility] pending final DC orders and PICC placement before DC to home.  If patient discharges after hours, please call [phone].   Larry Sierras 07/20/2018, 8:09 AM

## 2018-07-20 NOTE — Progress Notes (Signed)
CRITICAL VALUE ALERT  Critical Value:  Hgb 6.5  Date & Time Notied:  07/20/2018 and 2224  Provider Notified: Mingo Amber  Orders Received/Actions taken: Repeat CBC

## 2018-07-20 NOTE — Progress Notes (Signed)
CRITICAL VALUE ALERT  Critical Value:  6.5  Date & Time Notied:  07/20/2018 and 1350  Provider Notified: Mingo Amber  Orders Received/Actions taken: Transfuse 1 unit.

## 2018-07-20 NOTE — Anesthesia Postprocedure Evaluation (Signed)
Anesthesia Post Note  Patient: Sean Horton  Procedure(s) Performed: PARTIAL EXCISION OF LEFT PROXIMAL FEMUR, DEBRIDEMENT OF WOUND, APPLICATION OF WOUND VAC (Left Hip) APPLICATION OF WOUND VAC LEFT HIP AND LEFT LATERAL ANKLE (Left Hip)     Patient location during evaluation: PACU Anesthesia Type: General Level of consciousness: awake and alert Pain management: pain level controlled Vital Signs Assessment: post-procedure vital signs reviewed and stable Respiratory status: spontaneous breathing, nonlabored ventilation, respiratory function stable and patient connected to nasal cannula oxygen Cardiovascular status: blood pressure returned to baseline and stable Postop Assessment: no apparent nausea or vomiting Anesthetic complications: no    Last Vitals:  Vitals:   07/20/18 0418 07/20/18 0754  BP: (!) 85/55   Pulse: 83   Resp: 18   Temp: 36.7 C 36.8 C  SpO2: 100%     Last Pain:  Vitals:   07/20/18 0859  TempSrc:   PainSc: 7                  [name] S

## 2018-07-20 NOTE — Progress Notes (Signed)
Peripherally Inserted Central Catheter/Midline Placement  The IV Nurse has discussed with the patient and/or persons authorized to consent for the patient, the purpose of this procedure and the potential benefits and risks involved with this procedure.  The benefits include less needle sticks, lab draws from the catheter, and the patient may be discharged home with the catheter. Risks include, but not limited to, infection, bleeding, blood clot (thrombus formation), and puncture of an artery; nerve damage and irregular heartbeat and possibility to perform a PICC exchange if needed/ordered by physician.  Alternatives to this procedure were also discussed.  B[employer] Power PICC patient education guide, fact sheet on infection prevention and patient information card has been provided to patient /or left at bedside.    PICC/Midline Placement Documentation  PICC Single Lumen 07/20/18 Midline Right Brachial 48 cm 0 cm (Active)  Indication for Insertion or Continuance of Line Home intravenous therapies (PICC only) 07/20/2018  7:01 PM  Exposed Catheter (cm) 1 cm 07/20/2018  7:01 PM  Site Assessment Clean;Dry;Intact 07/20/2018  7:01 PM  Line Status Flushed;Saline locked;Blood return noted 07/20/2018  7:01 PM  Dressing Type Transparent 07/20/2018  7:01 PM  Dressing Status Clean;Dry;Intact;Antimicrobial disc in place 07/20/2018  7:01 PM  Dressing Change Due 07/27/18 07/20/2018  7:01 PM       Gordan Payment 07/20/2018, 7:04 PM

## 2018-07-20 NOTE — Progress Notes (Signed)
SPORTS MEDICINE AND JOINT REPLACEMENT  Lara Mulch, MD    Carlyon Shadow, PA-C [place], [place], [place]  [address]                             [phone]   PROGRESS NOTE  Subjective:  negative for Chest Pain  negative for Shortness of Breath  negative for Nausea/Vomiting   negative for Calf Pain  negative for Bowel Movement   Tolerating Diet: yes         Patient reports pain as 4 on 0-10 scale.    Objective: Vital signs in last 24 hours:    Patient Vitals for the past 24 hrs:  BP Temp Temp src Pulse Resp SpO2 Height Weight  07/20/18 0754 - 98.2 F (36.8 C) Oral - - - - -  07/20/18 0418 (!) 85/55 98.1 F (36.7 C) Oral 83 18 100 % - -  07/20/18 0030 (!) 71/61 99.1 F (37.3 C) Oral (!) 106 18 91 % - -  07/19/18 2220 109/78 99 F (37.2 C) Oral - 13 - - -  07/19/18 2036 - 98.3 F (36.8 C) - - - - - -  07/19/18 1822 (!) 92/57 98.3 F (36.8 C) Oral (!) 105 16 99 % - -  07/19/18 1725 107/79 98.1 F (36.7 C) - 92 18 98 % - -  07/19/18 1710 100/65 - - (!) 102 13 98 % - -  07/19/18 1655 (!) 85/50 - - (!) 107 11 95 % - -  07/19/18 1650 (!) 77/46 - - (!) 111 13 97 % - -  07/19/18 1640 (!) 74/45 - - (!) 121 13 98 % - -  07/19/18 1608 (!) 92/53 - - (!) 128 (!) 21 99 % - -  07/19/18 1553 99/66 - - (!) 115 15 99 % - -  07/19/18 1538 (!) 84/49 - - (!) 132 17 99 % - -  07/19/18 1523 (!) 126/94 98.2 F (36.8 C) - (!) 135 19 100 % - -  07/19/18 1140 - - - - - - 6\' 5"  (1.956 m) 90.7 kg    @flow {1959:LAST@   Intake/Output from previous day:   08/23 0701 - 08/24 0700 In: 2937.2 [I.V.:1657.2] Out: 4400 [Urine:2400; Drains:650]   Intake/Output this shift:   08/24 0701 - 08/24 1900 In: 792.3 [I.V.:506.1] Out: -    Intake/Output      08/23 0701 - 08/24 0700 08/24 0701 - 08/25 0700   P.O. 0    I.V. (mL/kg) 1657.2 (18.3) 506.1 (5.6)   Blood 630    IV Piggyback 650 286.2   Total Intake(mL/kg) 2937.2 (32.4) 792.3 (8.7)   Urine (mL/kg/hr) 2400 (1.1)    Drains 650     Blood 1350    Total Output 4400    Net -1462.8 +792.3           LABORATORY DATA: Recent Labs    07/16/18 1711 07/17/18 0515 07/17/18 0729 07/18/18 0611 07/19/18 0520 07/19/18 1643 07/20/18 0519  WBC 18.4* 14.3*  --  14.9* 13.1* 16.5* 16.6*  HGB 8.1* 6.7* 7.0* 8.4* 8.4* 8.7* 6.5*  HCT 27.1* 22.8* 23.6* 27.7* 28.0* 28.7* 21.3*  PLT 675* 539*  --  535* 598* 502* 444*   Recent Labs    07/16/18 1711 07/17/18 0515 07/18/18 0611 07/19/18 0520 07/20/18 0519  NA 136 137 139 140 137  K 4.8 4.3 4.3 4.3 4.0  CL 102 110 108  109 108  CO2 24 22 24 22 22   BUN 23* 20 14 14 15   CREATININE 1.32* 1.02 0.87 0.98 1.00  GLUCOSE 101* 212* 121* 84 142*  CALCIUM 9.4 8.2* 8.9 8.9 7.9*   Lab Results  Component Value Date   INR 1.10 07/18/2015    Examination:  General appearance: alert, cooperative and no distress Extremities: extremities normal, atraumatic, no cyanosis or edema  Wound Exam: clean, dry, intact   Drainage:  Scant/small amount Serous exudate  Motor Exam: Quadriceps and Hamstrings Impaired  Sensory Exam: Superficial Peroneal, Deep Peroneal and Tibial diminished   Assessment:    1 Day Post-Op  Procedure(s) (LRB): PARTIAL EXCISION OF LEFT PROXIMAL FEMUR, DEBRIDEMENT OF WOUND, APPLICATION OF WOUND VAC (Left) APPLICATION OF WOUND VAC LEFT HIP AND LEFT LATERAL ANKLE (Left)  ADDITIONAL DIAGNOSIS:  Principal Problem:   Osteomyelitis of pelvic region [facility]) Active Problems:   Paraplegia following spinal cord injury (HCC)   Neurogenic bladder   Recurrent UTI   Wound infection   Cellulitis of hip, left   Sacral decubitus ulcer     Plan:   Will continue to follow patient over the weekend. Plan is for Dr Sherrlyn Hock to operate next week. Plan for return to the operating room on Wednesday for irrigation and debridement with pulsatile lavage placement of the cleanse choice dressing with the reticulated foam against the wound and cover with the regular cleanse choice  dressing.           Donia Ast 07/20/2018, 9:58 AM

## 2018-07-21 ENCOUNTER — Encounter (HOSPITAL_COMMUNITY): Payer: Self-pay | Admitting: Orthopedic Surgery

## 2018-07-21 DIAGNOSIS — N319 Neuromuscular dysfunction of bladder, unspecified: Secondary | ICD-10-CM

## 2018-07-21 LAB — TYPE AND SCREEN
ABO/RH(D): O POS
ANTIBODY SCREEN: NEGATIVE
UNIT DIVISION: 0
UNIT DIVISION: 0
UNIT DIVISION: 0
Unit division: 0
Unit division: 0

## 2018-07-21 LAB — BPAM RBC
BLOOD PRODUCT EXPIRATION DATE: 201909232359
Blood Product Expiration Date: 201909222359
Blood Product Expiration Date: 201909222359
Blood Product Expiration Date: 201909232359
Blood Product Expiration Date: 201909232359
ISSUE DATE / TIME: 201908211052
ISSUE DATE / TIME: 201908212101
ISSUE DATE / TIME: 201908231406
ISSUE DATE / TIME: 201908231406
ISSUE DATE / TIME: 201908241725
UNIT TYPE AND RH: 5100
UNIT TYPE AND RH: 5100
UNIT TYPE AND RH: 5100
UNIT TYPE AND RH: 5100
Unit Type and Rh: 5100

## 2018-07-21 LAB — CULTURE, BLOOD (ROUTINE X 2)
Culture: NO GROWTH
Culture: NO GROWTH
Special Requests: ADEQUATE
Special Requests: ADEQUATE

## 2018-07-21 LAB — HEMOGLOBIN AND HEMATOCRIT, BLOOD
HCT: 24.2 % — ABNORMAL LOW (ref 39.0–52.0)
Hemoglobin: 7.3 g/dL — ABNORMAL LOW (ref 13.0–17.0)

## 2018-07-21 LAB — PREPARE RBC (CROSSMATCH)

## 2018-07-21 MED ORDER — SODIUM CHLORIDE 0.9% IV SOLUTION: Freq: Once | INTRAVENOUS | Status: DC
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
## 2018-07-21 NOTE — Discharge Summary (Addendum)
[facility] Discharge Summary  Patient name: Sean Horton Medical record number: [id number] Date of birth: 1986-07-01 Age: 32 y.o. 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 Ankles both in dressings.   Brief Hospital Course:  Mr. Wiebelhaus is a 32 year old male, with a past history significiant for paraplegia via T3 spinal cord injury and chronic ulcers, that presented with increased purulent drainage from his L hip ulcer for the past few weeks. Initially presented febrile and tachycardic with an elevated WBC of 18.4. He was started on IV Vancomycin, cefepime, and flagyl with fluid resuscitation. Vitals normalized following therapy. BC remained negative. MRI hip obtained showing osteomyelitis of L acetabulum, greater trochanter, distal sacrum, and coccyx. Orthopedic surgery was consulted; performed a bone biopsy and placed a wound vac on 8/23, and then returned to the OR on 8/26 for wound vac change and further debridement.  This procedure was also repeated on 8/30, with a wound vac change on 9/3. PICC was placed on 8/25 to continue broad spectrum antibiotics as above for 6 weeks per ID recommendation. Bone biopsy  showed MSSA and ID recommended continuing IV ertapenem via PICC until 10/4, with weekly labs while on therapy.    Patient is chronically anemic, and required 5 units of pRBCs during his stay, thought to be secondary to hemodilution and surgical related blood loss. Hgb 9.0 at discharge. His other chronic medical conditions were appropriately managed during his stay, with wound care provided for his additional chronic ulcers on sacrum and bilateral malleoli. His chronic indwelling catheter was replaced on 8/21.   Issues for Follow Up:  1. Please obtain a repeat CBC with diff, BMP, CRP and ESR on a weekly basis and at completion of antibiotic therapy. CRP and ESR on 07/30/18 3.6 and 69 respectively. Continues IV ertapenem via PICC until 10/4.  2. Sent home with [facility] wound care with home wound vac, will need a wound vac change on 9/6 and follow up with Dr. Sharol Given on 08/06/2018 at 1pm. After that appointment, likely return to wound vac changes 3x weekly MWF unless instructed otherwise by Dr. Sharol Given. 3. Additional orthopedic recommendations for home health: Daily dressing changes to R ankle with nonstick layer (mepitel or adaptic), 4x4 gauze and kerlix. Dressing changes to Left ankle every other day starting on 08/02/2018 with same materials as above. 3.   Iron supplementation when no longer acutely infected. Consider GI referral outpatient for chronic anemia. Hgb 9 at d/c.  4.   Provided 12 additional doses of oxycodone 7m for breakthrough pain to get through until follow up appointment on            Monday on top of his home chronic pain medications. Please re-evaluate for additional medication need.   Significant Procedures:  8/23 Bone Biopsy: MSSA, and wound  vac placement 8/25 PICC Line Placement 8/26 Debridement and wound vac change 8/30 Debridement and wound vac placement  9/3 Wound vac change   Significant Labs and Imaging:  Recent Labs  Lab 07/28/18 0506 07/29/18 0343 07/30/18 0429  WBC 13.9* 12.5*  10.6*  HGB 10.1* 10.2* 9.0*  HCT 34.3* 34.7* 31.1*  PLT 533* 503* 443*   Recent Labs  Lab 07/26/18 0421 07/27/18 0420 07/28/18 0506 07/29/18 0343 07/30/18 0429  NA 138 141 137 139 138  K 4.8 4.5 5.1 5.2* 5.4*  CL 105 111 104 103 103  CO2 _0 GLUCOSE 127* 88 184* 117* 102*  BUN _1 22* 18  CREATININE 0.83 0.71 0.90 0.96 1.05  CALCIUM 8.8* 7.6* 8.9 9.0 9.1     Results/Tests Pending at Time of Discharge: None   Discharge Medications:  Allergies as of 07/31/2018   No Known Allergies     Medication List    STOP taking these medications   acetaminophen 325 MG tablet Commonly known as:  TYLENOL   cyanocobalamin 1000 MCG tablet   lidocaine 5 % ointment Commonly known as:  XYLOCAINE   Zinc Oxide 12.8 % ointment Commonly known as:  TRIPLE PASTE     TAKE these medications   baclofen 20 MG tablet Commonly known as:  LIORESAL Take 40 mg by mouth 2 (two) times daily.   CRANBERRY PO Take 1 capsule by mouth daily.   DULoxetine 60 MG capsule Commonly known as:  CYMBALTA Take 60 mg by mouth 2 (two) times daily.   ENSURE Take 237 mLs by mouth 3 (three) times daily between meals.   ertapenem  IVPB Commonly known as:  INVANZ Inject 1 g into the vein daily. Indication: osteomyelitis Last Day of Therapy: 08/30/18   Labs - Once weekly:  CBC/D and BMP, Labs - Every other week:  ESR and CRP Leave PICC until seen by MD   FEROSUL 325 (65 FE) MG tablet Generic drug:  ferrous sulfate TAKE 1 TABLET(325 MG) BY MOUTH TWICE DAILY WITH A MEAL What changed:  See the new instructions.   fexofenadine-pseudoephedrine 180-240 MG 24 hr tablet Commonly known as:  ALLEGRA-D 24 Take 1 tablet by mouth daily.   folic acid 1 MG tablet Commonly known as:  FOLVITE Take 1 mg by mouth daily.   ibuprofen 800 MG tablet Commonly known as:  ADVIL,MOTRIN TAKE 1 TABLET BY MOUTH EVERY 8 HOURS AS NEEDED What changed:  reasons to take this   naloxone 4 MG/0.1ML Liqd nasal  spray kit Commonly known as:  NARCAN Place 1 spray into the nose once for 1 dose.   omeprazole 20 MG capsule Commonly known as:  PRILOSEC Take 1 capsule (20 mg total) by mouth daily.   OVER THE COUNTER MEDICATION Place 1 spray into both nostrils 2 (two) times daily as needed (congestion). Over the counter nasal spray   oxybutynin 5 MG tablet Commonly known as:  DITROPAN TAKE 1 TABLET(5 MG) BY MOUTH THREE TIMES DAILY What changed:  See the new instructions.   oxyCODONE 5 MG immediate release tablet Commonly known as:  Oxy IR/ROXICODONE Take 1 tablet (5 mg total) by mouth 2 (two) times daily as needed for up to 12 doses for severe pain or breakthrough pain.   oxyCODONE-acetaminophen 10-325 MG tablet Commonly known as:  PERCOCET Take 1 tablet by mouth every 8 (eight) hours as needed for pain.   polyethylene glycol packet Commonly known as:  MIRALAX / GLYCOLAX  Take 17 g by mouth daily as needed for mild constipation.   pregabalin 300 MG capsule Commonly known as:  LYRICA Take 300 mg by mouth 2 (two) times daily.   XTAMPZA ER 36 MG C12a Generic drug:  oxyCODONE ER Take 36 mg by mouth every 12 (twelve) hours.   zinc sulfate 220 (50 Zn) MG capsule Take 1 capsule (220 mg total) by mouth daily.            Home Infusion Instuctions  (From admission, onward)         Start     Ordered   07/31/18 0000  Home infusion instructions [facility] May follow [place] Dosing Protocol; May administer Cathflo as needed to maintain patency of vascular access device.; Flushing of vascular access device: per [facility] Protocol: 0.9% NaCl pre/post medica...    Question Answer Comment  Instructions May follow [place] Dosing Protocol   Instructions May administer Cathflo as needed to maintain patency of vascular access device.   Instructions Flushing of vascular access device: per [facility] Protocol: 0.9% NaCl pre/post medication administration and prn patency; Heparin 100 u/ml, 24m for  implanted ports and Heparin 10u/ml, 540mfor all other central venous catheters.   Instructions May follow [facility] Anaphylaxis Protocol for First Dose Administration in the home: 0.9% NaCl at 25-50 ml/hr to maintain IV access for protocol meds. Epinephrine 0.3 ml IV/IM PRN and Benadryl 25-50 IV/IM PRN s/s of anaphylaxis.   Instructions [facility] Infusion Coordinator (RN) to assist per patient IV care needs in the home PRN.      07/31/18 [place](From admission, onward)         Start     Ordered   07/30/18 0944  For home use only DME Hospital bed  Once    Question Answer Comment  Patient has (list medical condition): paraplegic, numerous pressure sores   The above medical condition requires: Patient requires the ability to reposition frequently   Bed type Semi-electric   Trapeze Bar Yes   Support Surface: Low Air loss Mattress      07/30/18 0944   07/30/18 0944  For home use only DME Air overlay mattress  Once     07/30/18 0944   07/29/18 0940  For home use only DME Tub bench  Once     07/29/18 0939   07/26/18 1014  For home use only DME Negative pressure wound device  Once    Question Answer Comment  Frequency of dressing change 3 times per week   Length of need 3 Months   Dressing type Foam   Amount of suction 100 mm/Hg   Pressure application Continuous pressure   Supplies 10 canisters and 15 dressings per month for duration of therapy      07/26/18 1013          Discharge Instructions: Please refer to Patient Instructions section of EMR for full details.  Patient was counseled important signs and symptoms that should prompt return to medical care, changes in medications, dietary instructions, activity restrictions, and follow up appointments.   Follow-Up Appointments: Follow-up Information    DuNewt MinionMD. Go on 08/06/2018.   Specialty:  Orthopedic Surgery Why:  appointment at 1pm bring wound vac supplies to  appointment Contact information: [phone]        FeEverrett CoombeMD. Go on 08/05/2018.  Specialty:  Family Medicine Why:  1:50 pm, please arrive 15 minutes early.  Contact information: [address] [place] [address] [phone]          Greater than 30 minutes time was spent for counseling and coordination of care for discharging this patient.    Patriciaann Clan, DO 07/31/2018, 4:30 PM PGY-1, [facility]  9/4 1800: After patient was discharged, paged by nursing that patient was in intolerable pain requiring IV pain medication and will be staying overnight.

## 2018-07-22 ENCOUNTER — Encounter (HOSPITAL_COMMUNITY)
Admission: EM | Disposition: A | Discharge status: Home Health | Payer: Self-pay | Source: Ambulatory Visit | Attending: Family Medicine

## 2018-07-22 ENCOUNTER — Inpatient Hospital Stay (HOSPITAL_COMMUNITY): Payer: Medicaid Other | Admitting: Certified Registered Nurse Anesthetist

## 2018-07-22 ENCOUNTER — Encounter (HOSPITAL_COMMUNITY): Payer: Self-pay | Admitting: Orthopedic Surgery

## 2018-07-22 HISTORY — PX: DRESSING CHANGE UNDER ANESTHESIA: SHX5237

## 2018-07-22 LAB — HEMOGLOBIN AND HEMATOCRIT, BLOOD
HCT: 28 % — ABNORMAL LOW (ref 39.0–52.0)
HEMOGLOBIN: 8.3 g/dL — AB (ref 13.0–17.0)

## 2018-07-22 LAB — CBC
HEMATOCRIT: 24.2 % — AB (ref 39.0–52.0)
Hemoglobin: 7.2 g/dL — ABNORMAL LOW (ref 13.0–17.0)
MCH: 25.4 pg — AB (ref 26.0–34.0)
MCHC: 29.8 g/dL — AB (ref 30.0–36.0)
MCV: 85.5 fL (ref 78.0–100.0)
Platelets: 425 10*3/uL — ABNORMAL HIGH (ref 150–400)
RBC: 2.83 MIL/uL — ABNORMAL LOW (ref 4.22–5.81)
RDW: 23.9 % — AB (ref 11.5–15.5)
WBC: 14.2 10*3/uL — ABNORMAL HIGH (ref 4.0–10.5)

## 2018-07-22 LAB — BASIC METABOLIC PANEL
Anion gap: 5 (ref 5–15)
BUN: 10 mg/dL (ref 6–20)
CALCIUM: 8 mg/dL — AB (ref 8.9–10.3)
CO2: 23 mmol/L (ref 22–32)
CREATININE: 0.84 mg/dL (ref 0.61–1.24)
Chloride: 111 mmol/L (ref 98–111)
GFR calc non Af Amer: >60 mL/min (ref >60)
Glucose, Bld: 175 mg/dL — ABNORMAL HIGH (ref 70–99)
Potassium: 4.1 mmol/L (ref 3.5–5.1)
SODIUM: 139 mmol/L (ref 135–145)

## 2018-07-22 LAB — LACTATE DEHYDROGENASE: LDH: 100 U/L (ref 98–192)

## 2018-07-22 LAB — SURGICAL PCR SCREEN
MRSA, PCR: NEGATIVE
STAPHYLOCOCCUS AUREUS: POSITIVE — AB

## 2018-07-22 LAB — SAVE SMEAR

## 2018-07-22 LAB — RETICULOCYTES
RBC.: 3.29 MIL/uL — ABNORMAL LOW (ref 4.22–5.81)
Retic Count, Absolute: 174.4 10*3/uL (ref 19.0–186.0)
Retic Ct Pct: 5.3 % — ABNORMAL HIGH (ref 0.4–3.1)

## 2018-07-22 SURGERY — REPLACEMENT, DRESSING, WITH ANESTHESIA
Anesthesia: General | Laterality: Left

## 2018-07-22 MED ORDER — LIDOCAINE 2% (20 MG/ML) 5 ML SYRINGE
INTRAMUSCULAR | Status: AC
  Filled 2018-07-22: qty 5

## 2018-07-22 MED ORDER — FENTANYL CITRATE (PF) 100 MCG/2ML IJ SOLN
INTRAMUSCULAR | Status: DC | PRN
  Administered 2018-07-22: 100 ug via INTRAVENOUS

## 2018-07-22 MED ORDER — MIDAZOLAM HCL 2 MG/2ML IJ SOLN
INTRAMUSCULAR | Status: AC
  Filled 2018-07-22: qty 2

## 2018-07-22 MED ORDER — SUGAMMADEX SODIUM 200 MG/2ML IV SOLN
INTRAVENOUS | Status: DC | PRN
  Administered 2018-07-22: 180 mg via INTRAVENOUS

## 2018-07-22 MED ORDER — SODIUM CHLORIDE 0.9 % IV SOLN
INTRAVENOUS | Status: DC | PRN
  Administered 2018-07-22: 50 ug/min via INTRAVENOUS

## 2018-07-22 MED ORDER — CHLORHEXIDINE GLUCONATE CLOTH 2 % EX PADS
6.0000 | MEDICATED_PAD | Freq: Every day | CUTANEOUS | Status: AC
  Administered 2018-07-23 – 2018-07-26 (×4): 6 via TOPICAL

## 2018-07-22 MED ORDER — FENTANYL CITRATE (PF) 250 MCG/5ML IJ SOLN
INTRAMUSCULAR | Status: AC
  Filled 2018-07-22: qty 5

## 2018-07-22 MED ORDER — PROPOFOL 10 MG/ML IV BOLUS
INTRAVENOUS | Status: DC | PRN
  Administered 2018-07-22: 70 mg via INTRAVENOUS

## 2018-07-22 MED ORDER — ONDANSETRON HCL 4 MG/2ML IJ SOLN
INTRAMUSCULAR | Status: DC | PRN
  Administered 2018-07-22: 4 mg via INTRAVENOUS

## 2018-07-22 MED ORDER — PHENYLEPHRINE HCL 10 MG/ML IJ SOLN
INTRAMUSCULAR | Status: DC | PRN
  Administered 2018-07-22 (×3): 80 ug via INTRAVENOUS

## 2018-07-22 MED ORDER — SODIUM CHLORIDE 0.9 % IR SOLN
Status: DC | PRN
  Administered 2018-07-22 (×2): 1000 mL

## 2018-07-22 MED ORDER — FENTANYL CITRATE (PF) 100 MCG/2ML IJ SOLN: 25.0000 ug | INTRAMUSCULAR | Status: DC | PRN

## 2018-07-22 MED ORDER — ROCURONIUM BROMIDE 50 MG/5ML IV SOSY
PREFILLED_SYRINGE | INTRAVENOUS | Status: DC | PRN
  Administered 2018-07-22: 50 mg via INTRAVENOUS

## 2018-07-22 MED ORDER — SODIUM CHLORIDE 0.9 % IV SOLN
1.0000 g | INTRAVENOUS | Status: DC
  Filled 2018-07-22: qty 1

## 2018-07-22 MED ORDER — LIDOCAINE 2% (20 MG/ML) 5 ML SYRINGE
INTRAMUSCULAR | Status: DC | PRN
  Administered 2018-07-22: 40 mg via INTRAVENOUS

## 2018-07-22 MED ORDER — LACTATED RINGERS IV SOLN
INTRAVENOUS | Status: DC
  Administered 2018-07-22: 10:00:00 via INTRAVENOUS

## 2018-07-22 MED ORDER — MUPIROCIN 2 % EX OINT
1.0000 "application " | TOPICAL_OINTMENT | Freq: Two times a day (BID) | CUTANEOUS | Status: AC
  Administered 2018-07-22 – 2018-07-26 (×9): 1 via NASAL
  Filled 2018-07-22 (×3): qty 22

## 2018-07-22 MED ORDER — MIDAZOLAM HCL 5 MG/5ML IJ SOLN
INTRAMUSCULAR | Status: DC | PRN
  Administered 2018-07-22: 2 mg via INTRAVENOUS

## 2018-07-22 MED ORDER — DEXAMETHASONE SODIUM PHOSPHATE 4 MG/ML IJ SOLN
INTRAMUSCULAR | Status: DC | PRN
  Administered 2018-07-22: 5 mg via INTRAVENOUS

## 2018-07-22 MED ORDER — LACTATED RINGERS IV SOLN: INTRAVENOUS | Status: DC

## 2018-07-22 MED ORDER — KETOROLAC TROMETHAMINE 15 MG/ML IJ SOLN
15.0000 mg | Freq: Once | INTRAMUSCULAR | Status: AC
  Administered 2018-07-22: 15 mg via INTRAVENOUS
  Filled 2018-07-22 (×2): qty 1

## 2018-07-22 MED ORDER — MEPERIDINE HCL 50 MG/ML IJ SOLN: 6.2500 mg | INTRAMUSCULAR | Status: DC | PRN

## 2018-07-22 MED ORDER — ONDANSETRON HCL 4 MG/2ML IJ SOLN
INTRAMUSCULAR | Status: AC
  Filled 2018-07-22: qty 2

## 2018-07-22 MED ORDER — PROMETHAZINE HCL 25 MG/ML IJ SOLN: 6.2500 mg | INTRAMUSCULAR | Status: DC | PRN

## 2018-07-22 MED ORDER — VANCOMYCIN HCL 1000 MG IV SOLR
INTRAVENOUS | Status: AC
  Filled 2018-07-22: qty 1000

## 2018-07-22 MED ORDER — DEXAMETHASONE SODIUM PHOSPHATE 10 MG/ML IJ SOLN
INTRAMUSCULAR | Status: AC
  Filled 2018-07-22: qty 1

## 2018-07-22 SURGICAL SUPPLY — 41 items
BLADE SURG 10 STRL SS (BLADE) ×3 IMPLANT
BNDG COHESIVE 4X5 TAN STRL (GAUZE/BANDAGES/DRESSINGS) ×3 IMPLANT
BRUSH SCRUB SURG 4.25 DISP (MISCELLANEOUS) ×6 IMPLANT
CANISTER WOUND CARE 500ML ATS (WOUND CARE) ×3 IMPLANT
CASSETTE VERAFLO VERALINK (MISCELLANEOUS) ×3 IMPLANT
COVER SURGICAL LIGHT HANDLE (MISCELLANEOUS) ×6 IMPLANT
DRAPE C-ARMOR (DRAPES) ×3 IMPLANT
DRAPE INCISE IOBAN 66X45 STRL (DRAPES) ×3 IMPLANT
DRAPE ORTHO SPLIT 77X108 STRL (DRAPES) ×4
DRAPE SURG ORHT 6 SPLT 77X108 (DRAPES) ×2 IMPLANT
DRAPE U-SHAPE 47X51 STRL (DRAPES) ×3 IMPLANT
DRESSING VERAFLO CLEANSE CC (GAUZE/BANDAGES/DRESSINGS) ×1 IMPLANT
DRSG ADAPTIC 3X8 NADH LF (GAUZE/BANDAGES/DRESSINGS) IMPLANT
DRSG MEPITEL 4X7.2 (GAUZE/BANDAGES/DRESSINGS) ×3 IMPLANT
DRSG VAC ATS MED SENSATRAC (GAUZE/BANDAGES/DRESSINGS) ×3 IMPLANT
DRSG VERAFLO CLEANSE CC (GAUZE/BANDAGES/DRESSINGS) ×3
ELECT REM PT RETURN 9FT ADLT (ELECTROSURGICAL)
ELECTRODE REM PT RTRN 9FT ADLT (ELECTROSURGICAL) IMPLANT
GLOVE BIO SURGEON STRL SZ7.5 (GLOVE) ×9 IMPLANT
GLOVE BIO SURGEON STRL SZ8 (GLOVE) ×3 IMPLANT
GLOVE BIOGEL PI IND STRL 7.5 (GLOVE) ×2 IMPLANT
GLOVE BIOGEL PI IND STRL 8 (GLOVE) ×1 IMPLANT
GLOVE BIOGEL PI INDICATOR 7.5 (GLOVE) ×4
GLOVE BIOGEL PI INDICATOR 8 (GLOVE) ×2
GOWN STRL REUS W/ TWL LRG LVL3 (GOWN DISPOSABLE) ×2 IMPLANT
GOWN STRL REUS W/ TWL XL LVL3 (GOWN DISPOSABLE) ×1 IMPLANT
GOWN STRL REUS W/TWL LRG LVL3 (GOWN DISPOSABLE) ×4
GOWN STRL REUS W/TWL XL LVL3 (GOWN DISPOSABLE) ×2
HANDPIECE INTERPULSE COAX TIP (DISPOSABLE)
KIT BASIN OR (CUSTOM PROCEDURE TRAY) ×3 IMPLANT
KIT TURNOVER KIT B (KITS) ×3 IMPLANT
MANIFOLD NEPTUNE II (INSTRUMENTS) ×3 IMPLANT
NS IRRIG 1000ML POUR BTL (IV SOLUTION) ×3 IMPLANT
PACK GENERAL/GYN (CUSTOM PROCEDURE TRAY) ×3 IMPLANT
PACK UNIVERSAL I (CUSTOM PROCEDURE TRAY) ×3 IMPLANT
PAD ARMBOARD 7.5X6 YLW CONV (MISCELLANEOUS) ×6 IMPLANT
SET HNDPC FAN SPRY TIP SCT (DISPOSABLE) IMPLANT
STAPLER VISISTAT 35W (STAPLE) ×3 IMPLANT
STOCKINETTE IMPERVIOUS 9X36 MD (GAUZE/BANDAGES/DRESSINGS) ×3 IMPLANT
TOWEL OR 17X26 10 PK STRL BLUE (TOWEL DISPOSABLE) ×6 IMPLANT
UNDERPAD 30X30 (UNDERPADS AND DIAPERS) ×3 IMPLANT

## 2018-07-22 NOTE — Anesthesia Preprocedure Evaluation (Addendum)
Anesthesia Evaluation  Patient identified by MRN, date of birth, ID band Patient awake    Reviewed: Allergy & Precautions, NPO status , Patient's Chart, lab work & pertinent test results  Airway Mallampati: I  TM Distance: >3 FB Neck ROM: Full    Dental  (+) Teeth Intact, Dental Advisory Given   Pulmonary Current Smoker,    breath sounds clear to auscultation       Cardiovascular hypertension,  Rhythm:Regular Rate:Normal     Neuro/Psych  Headaches,  Neuromuscular disease    GI/Hepatic Neg liver ROS, GERD  Medicated,  Endo/Other  negative endocrine ROS  Renal/GU negative Renal ROS     Musculoskeletal negative musculoskeletal ROS (+)   Abdominal Normal abdominal exam  (+)   Peds  Hematology   Anesthesia Other Findings   Reproductive/Obstetrics                            Anesthesia Physical Anesthesia Plan  ASA: III  Anesthesia Plan: General   Post-op Pain Management:    Induction: Intravenous  PONV Risk Score and Plan: 2 and Ondansetron and Midazolam  Airway Management Planned: LMA  Additional Equipment: None  Intra-op Plan:   Post-operative Plan: Extubation in OR  Informed Consent: I have reviewed the patients History and Physical, chart, labs and discussed the procedure including the risks, benefits and alternatives for the proposed anesthesia with the patient or authorized representative who has indicated his/her understanding and acceptance.   Dental advisory given  Plan Discussed with: CRNA  Anesthesia Plan Comments:        Anesthesia Quick Evaluation

## 2018-07-22 NOTE — Progress Notes (Signed)
I was asked to take over care for today for repeat irrigation and debridement and wound vac change. Discussed with patient and he agrees to consent. Plan for return to operating room Wednesday with Dr. Sharol Given.  Shona Needles, MD [facility] [phone] (phone)

## 2018-07-22 NOTE — Progress Notes (Signed)
Patient ID: Sean Horton, male   DOB: 1986-09-16, 32 y.o.   MRN: [id number]          [facility]    Date of Admission:  07/16/2018   Total days of antibiotics 7         Mr. Capriotti is back in the operating room for his second debridement of his chronic left hip wound and osteomyelitis.  Operative Gram stain from his first debridement 3 days ago does not show any organisms and cultures are pending.  I will continue current antibiotics.  A PICC has been placed.        Michel Bickers, MD [facility] [phone] pager   [phone] cell 07/22/2018, 12:40 PM

## 2018-07-22 NOTE — Transfer of Care (Signed)
Immediate Anesthesia Transfer of Care Note  Patient: Sean Horton  Procedure(s) Performed: DRESSING CHANGE UNDER ANESTHESIA AND WOUND VAC CHANGE (Left )  Patient Location: PACU  Anesthesia Type:General  Level of Consciousness: awake, alert , oriented, drowsy and patient cooperative  Airway & Oxygen Therapy: Patient Spontanous Breathing  Post-op Assessment: Report given to RN and Post -op Vital signs reviewed and stable  Post vital signs: Reviewed and stable  Last Vitals:  Vitals Value Taken Time  BP    Temp    Pulse 92 07/22/2018 12:17 PM  Resp 14 07/22/2018 12:17 PM  SpO2 96 % 07/22/2018 12:17 PM  Vitals shown include unvalidated device data.  Last Pain:  Vitals:   07/22/18 0753  TempSrc:   PainSc: 0-No pain      Patients Stated Pain Goal: 2 (83/77/93 9688)  Complications: No apparent anesthesia complications

## 2018-07-22 NOTE — Op Note (Addendum)
OrthopaedicSurgeryOperativeNote ([id number]) Date of Surgery: 07/22/2018  Admit Date: 07/16/2018   Diagnoses: Pre-Op Diagnoses: Left hip decubitus ulcer with osteomyelitis   Post-Op Diagnosis: Same  Procedures: 1. CPT 11043-Debridement of hip decubitus ulcer 2. CPT 97606-Wound vac change  Surgeons: Primary: Haddix, Thomasene Lot, MD   Assistant: Ainsley Spinner, PA-C   Location:[facility] OR ROOM 07   AnesthesiaGeneral   Antibiotics:Cefepime 1 gm  Tourniquettime:None.  EstimatedBloodLoss:Minimal  Complications:None  Specimens:None  Implants: None  IndicationsforSurgery: 31 year old male with a history of paraplegia who had a left hip wound.  He was infected and was found on MRI to have osteomyelitis through multiple areas of the bone.  Due to the acute nature of this the patient was taken on 07/19/2018 by Dr. Marcelino Scot for proximal femur resection and debridement.  He also had a wound VAC placed.  I was asked to to the OR availability to return to the operating room to perform a repeat debridement and wound VAC dressing change.  I discussed risks and benefits with the patient and he agreed to proceed with surgery and consent was obtained.  Operative Findings: 1. Healthy appearance of wound, no signs of necrosis or infection 2. Wound 9cm x 6cm, 6.5cm deep with undermining from 12-6 o'clock of 6cm, 6-9 o'clock about 7.5cm and 9-12 o'clock about 3.5 cm 3. Placement of Ver[place]low wound vac  Procedure: The patient was identified in the preoperative holding area. Consent was confirmed with the patient and their family and all questions were answered. The operative extremity was marked after confirmation with the patient. he was then brought back to the operating room by our anesthesia colleagues.  He was carefully transferred over to a regular or table.  He was placed in the lateral decubitus position.  He was placed under general anesthetic prior to positioning. The operative extremity  was then prepped and draped in usual sterile fashion. A preoperative timeout was performed to verify the patient, the procedure, and the extremity. Preoperative antibiotics were dosed.  The previous wound VAC was removed.  At that point we were able to identify the wound as noted above.  I performed a gentle excisional debridement to the skin and muscle and subcutaneous fat.  It was then thoroughly irrigated.  We then replaced the vera flow wound VAC.  It was connected to suction and a good seal was being able to be obtained.  An incisional wound VAC was placed over the proximal and distal aspect of the incision.  The patient was then awoken from anesthesia and taken to PACU in stable condition.  Post Op Plan/Instructions: Patient will be returned to the operating room on Wednesday with Dr. Sharol Given for repeat debridement and wound VAC change.  Antibiotics will be at the discretion of the primary team.  DVT prophylaxis will be at the discretion of primary team.  I was present and available for the entire surgery.  Katha Hamming, MD [facility]

## 2018-07-22 NOTE — Anesthesia Procedure Notes (Signed)
Procedure Name: Intubation Date/Time: 07/22/2018 11:11 AM Performed by: Oletta Lamas, CRNA Pre-anesthesia Checklist: Patient identified, Emergency Drugs available, Suction available and Patient being monitored Patient Re-evaluated:Patient Re-evaluated prior to induction Oxygen Delivery Method: Circle System Utilized Preoxygenation: Pre-oxygenation with 100% oxygen Induction Type: IV induction Ventilation: Mask ventilation without difficulty Laryngoscope Size: Mac and 4 Grade View: Grade II Tube type: Oral Number of attempts: 1 Airway Equipment and Method: Stylet Placement Confirmation: ETT inserted through vocal cords under direct vision,  positive ETCO2 and breath sounds checked- equal and bilateral Secured at: 23 cm Tube secured with: Tape Dental Injury: Teeth and Oropharynx as per pre-operative assessment

## 2018-07-22 NOTE — Anesthesia Postprocedure Evaluation (Signed)
Anesthesia Post Note  Patient: Sean Horton  Procedure(s) Performed: DRESSING CHANGE UNDER ANESTHESIA AND WOUND VAC CHANGE (Left )     Patient location during evaluation: PACU Anesthesia Type: General Level of consciousness: awake and alert Pain management: pain level controlled Vital Signs Assessment: post-procedure vital signs reviewed and stable Respiratory status: spontaneous breathing, nonlabored ventilation, respiratory function stable and patient connected to nasal cannula oxygen Cardiovascular status: blood pressure returned to baseline and stable Postop Assessment: no apparent nausea or vomiting Anesthetic complications: no    Last Vitals:  Vitals:   07/22/18 1231 07/22/18 1242  BP: 129/86   Pulse: 84   Resp: 12   Temp:  36.6 C  SpO2: 97%     Last Pain:  Vitals:   07/22/18 1242  TempSrc: Oral  PainSc:                  [name]

## 2018-07-23 ENCOUNTER — Encounter (HOSPITAL_COMMUNITY): Payer: Self-pay | Admitting: Student

## 2018-07-23 DIAGNOSIS — M86452 Chronic osteomyelitis with draining sinus, left femur: Secondary | ICD-10-CM

## 2018-07-23 DIAGNOSIS — B9561 Methicillin susceptible Staphylococcus aureus infection as the cause of diseases classified elsewhere: Secondary | ICD-10-CM

## 2018-07-23 DIAGNOSIS — Z978 Presence of other specified devices: Secondary | ICD-10-CM

## 2018-07-23 DIAGNOSIS — L03116 Cellulitis of left lower limb: Secondary | ICD-10-CM

## 2018-07-23 DIAGNOSIS — M00052 Staphylococcal arthritis, left hip: Secondary | ICD-10-CM

## 2018-07-23 DIAGNOSIS — Z720 Tobacco use: Secondary | ICD-10-CM

## 2018-07-23 DIAGNOSIS — M869 Osteomyelitis, unspecified: Secondary | ICD-10-CM

## 2018-07-23 DIAGNOSIS — M4628 Osteomyelitis of vertebra, sacral and sacrococcygeal region: Secondary | ICD-10-CM

## 2018-07-23 LAB — BASIC METABOLIC PANEL
Anion gap: 5 (ref 5–15)
BUN: 18 mg/dL (ref 6–20)
CHLORIDE: 111 mmol/L (ref 98–111)
CO2: 25 mmol/L (ref 22–32)
Calcium: 8.8 mg/dL — ABNORMAL LOW (ref 8.9–10.3)
Creatinine, Ser: 0.92 mg/dL (ref 0.61–1.24)
GFR calc Af Amer: >60 mL/min (ref >60)
GFR calc non Af Amer: >60 mL/min (ref >60)
Glucose, Bld: 150 mg/dL — ABNORMAL HIGH (ref 70–99)
POTASSIUM: 4.6 mmol/L (ref 3.5–5.1)
SODIUM: 141 mmol/L (ref 135–145)

## 2018-07-23 LAB — CBC WITH DIFFERENTIAL/PLATELET
Basophils Absolute: 0.1 10*3/uL (ref 0.0–0.1)
Basophils Relative: 1 %
EOS PCT: 3 %
Eosinophils Absolute: 0.4 10*3/uL (ref 0.0–0.7)
HCT: 29.7 % — ABNORMAL LOW (ref 39.0–52.0)
HEMOGLOBIN: 8.8 g/dL — AB (ref 13.0–17.0)
LYMPHS ABS: 4.7 10*3/uL — AB (ref 0.7–4.0)
LYMPHS PCT: 33 %
MCH: 26.2 pg (ref 26.0–34.0)
MCHC: 29.6 g/dL — ABNORMAL LOW (ref 30.0–36.0)
MCV: 88.4 fL (ref 78.0–100.0)
MONOS PCT: 5 %
Monocytes Absolute: 0.7 10*3/uL (ref 0.1–1.0)
Neutro Abs: 8.3 10*3/uL — ABNORMAL HIGH (ref 1.7–7.7)
Neutrophils Relative %: 58 %
Platelets: 513 10*3/uL — ABNORMAL HIGH (ref 150–400)
RBC: 3.36 MIL/uL — AB (ref 4.22–5.81)
RDW: 24.5 % — ABNORMAL HIGH (ref 11.5–15.5)
WBC: 14.2 10*3/uL — AB (ref 4.0–10.5)

## 2018-07-23 LAB — CBC
HCT: 23.9 % — ABNORMAL LOW (ref 39.0–52.0)
HEMOGLOBIN: 7.1 g/dL — AB (ref 13.0–17.0)
MCH: 25.9 pg — ABNORMAL LOW (ref 26.0–34.0)
MCHC: 29.7 g/dL — ABNORMAL LOW (ref 30.0–36.0)
MCV: 87.2 fL (ref 78.0–100.0)
Platelets: 489 10*3/uL — ABNORMAL HIGH (ref 150–400)
RBC: 2.74 MIL/uL — AB (ref 4.22–5.81)
RDW: 25.2 % — ABNORMAL HIGH (ref 11.5–15.5)
WBC: 14.5 10*3/uL — ABNORMAL HIGH (ref 4.0–10.5)

## 2018-07-23 LAB — PREPARE RBC (CROSSMATCH)

## 2018-07-23 LAB — VANCOMYCIN, TROUGH: Vancomycin Tr: 17 ug/mL (ref 15–20)

## 2018-07-23 LAB — HAPTOGLOBIN: Haptoglobin: 228 mg/dL — ABNORMAL HIGH (ref 34–200)

## 2018-07-23 MED ORDER — SENNOSIDES-DOCUSATE SODIUM 8.6-50 MG PO TABS: 1.0000 | ORAL_TABLET | Freq: Every day | ORAL | Status: DC | PRN

## 2018-07-23 MED ORDER — NALOXONE HCL 0.4 MG/ML IJ SOLN: 0.4000 mg | INTRAMUSCULAR | Status: DC | PRN

## 2018-07-23 MED ORDER — KETOROLAC TROMETHAMINE 15 MG/ML IJ SOLN
15.0000 mg | Freq: Once | INTRAMUSCULAR | Status: AC
  Administered 2018-07-23: 15 mg via INTRAVENOUS
  Filled 2018-07-23: qty 1

## 2018-07-23 MED ORDER — SODIUM CHLORIDE 0.9% IV SOLUTION
Freq: Once | INTRAVENOUS | Status: AC
  Administered 2018-07-23: 13:00:00 via INTRAVENOUS

## 2018-07-23 MED ORDER — SODIUM CHLORIDE 0.9% FLUSH: 9.0000 mL | INTRAVENOUS | Status: DC | PRN

## 2018-07-23 MED ORDER — OXYCODONE-ACETAMINOPHEN 5-325 MG PO TABS: 1.0000 | ORAL_TABLET | Freq: Once | ORAL | Status: DC

## 2018-07-23 MED ORDER — MORPHINE SULFATE 2 MG/ML IV SOLN
INTRAVENOUS | Status: DC
  Filled 2018-07-23 (×2): qty 30

## 2018-07-23 MED ORDER — MORPHINE SULFATE 2 MG/ML IV SOLN: INTRAVENOUS | Status: DC

## 2018-07-23 MED ORDER — HYDROMORPHONE 1 MG/ML IV SOLN
INTRAVENOUS | Status: DC
  Administered 2018-07-23 – 2018-07-24 (×2): via INTRAVENOUS
  Administered 2018-07-24: 0.9 mg via INTRAVENOUS
  Administered 2018-07-24: 3 mg via INTRAVENOUS
  Filled 2018-07-23 (×2): qty 25

## 2018-07-23 MED ORDER — ONDANSETRON HCL 4 MG/2ML IJ SOLN: 4.0000 mg | Freq: Four times a day (QID) | INTRAMUSCULAR | Status: DC | PRN

## 2018-07-23 MED ORDER — DIPHENHYDRAMINE HCL 12.5 MG/5ML PO ELIX: 12.5000 mg | ORAL_SOLUTION | Freq: Four times a day (QID) | ORAL | Status: DC | PRN

## 2018-07-23 MED ORDER — BARRIER CREAM NON-SPECIFIED
1.0000 "application " | TOPICAL_CREAM | Freq: Two times a day (BID) | TOPICAL | Status: DC | PRN
  Filled 2018-07-23: qty 1

## 2018-07-23 MED ORDER — DIPHENHYDRAMINE HCL 50 MG/ML IJ SOLN: 12.5000 mg | Freq: Four times a day (QID) | INTRAMUSCULAR | Status: DC | PRN

## 2018-07-23 NOTE — Consult Note (Addendum)
[place] Nurse wound consult note Ortho service is following for assessment and plan of care to post-op Vac dressing and patient states it will be changed in the OR.    [place] consults have already been performed previously on 8/21, 8/22, and also again on 8/22.  Please refer to previous progress notes for assessments and plan of care for various wounds.  Requested to assess left arm wound today. Wound type: Left outer upper arm/shoulder with full thickness wound; pt has dry cracked lighter-colored scar tissue to this site from previous wounds which have healed and recently the location re-opened when he was scratching. Measurement: 4X2X.2cm Wound bed: red and moist Drainage (amount, consistency, odor) mod amt yellow drainage, no odor Periwound: Dry scar tissue surrounding the site. Dressing procedure/placement/frequency: Aquacel to absorb drainage to promote healing and provide antimicrobial benefits. Foam dressing to protect from further injury.  Discussed plan of care with patient. Please re-consult if further assistance is needed.  Thank-you,  Julien Girt MSN, [place], [place], [place], [place]

## 2018-07-23 NOTE — Progress Notes (Signed)
Notified physician of needing more pain medication, scheduled and PRN was not controlling the pain.  Received one time dose of toradol and administered.  Will continue to monitor.

## 2018-07-23 NOTE — Progress Notes (Signed)
Pharmacy Antibiotic Note  Sean Horton is a 32 y.o. male admitted on 07/16/2018 with wound infection and pelvic osteo, continue on vancomycin, cefepime and flagyl.  Vancomycin trough = 17 on 1g Q 12 hrs, drawn appropriately. Renal function stable  Plan: Continue vancomycin 1g IV every 12 hours. Continue cefepime 1g IV every 8 hours Monitor renal function, f/u LOT   Height: 6' 5.01" (195.6 cm) Weight: 199 lb 15.3 oz (90.7 kg) IBW/kg (Calculated) : 89.12  Temp (24hrs), Avg:97.7 F (36.5 C), Min:97.4 F (36.3 C), Max:98 F (36.7 C)  Recent Labs  Lab 07/16/18 2003  07/18/18 0611 07/19/18 0520 07/19/18 1643 07/20/18 0519 07/20/18 1334 07/22/18 0109 07/23/18 0526 07/23/18 1821  WBC  --    < > 14.9* 13.1* 16.5* 16.6* 15.2* 14.2* 14.5* 14.2*  CREATININE  --    < > 0.87 0.98  --  1.00  --  0.84 0.92  --   LATICACIDVEN 1.16  --   --   --   --   --   --   --   --   --   VANCOTROUGH  --   --   --   --  20  --   --   --   --  17   < > = values in this interval not displayed.    Estimated Creatinine Clearance: 143.9 mL/min (by C-G formula based on SCr of 0.92 mg/dL).  Baseline Scr ~1.06  No Known Allergies  Antimicrobials this admission: 8/20 Vancomycin >>  8/20  Cefepime >>  Dose adjustments this admission: VT 20 (11 hours after last dose) - decrease to 1g IV every 12 hr VT = 17 on 1g Q 12 hrs, no change Microbiology results: 8/20 BCx: NGTD 8/21 MRSA PCR: neg  Thank you for allowing pharmacy to be a part of this patient's care.  Maryanna Shape, PharmD, BCPS, BCPPS Clinical Pharmacist  Pager: [phone]   07/23/2018 7:24 PM

## 2018-07-23 NOTE — Progress Notes (Signed)
Wound dressed on left upper arm

## 2018-07-23 NOTE — Progress Notes (Signed)
Loanne Drilling, MD to clarify if pt was stepdown or telemetry level of care. Higinio Plan, MD stated that pt was a telemetry pt. Will continue to monitor pt. Ranelle Oyster, RN

## 2018-07-23 NOTE — Progress Notes (Signed)
Orthopedic Trauma Service Progress Note   Patient ID: Sean Horton MRN: [id number] DOB/AGE: 06/23/1986 32 y.o.  Subjective:  Doing ok  Primary complains of severe nerve pain  On PCA now   Veraflo working very well  Dressing is stable   States he can move his left leg around easier   ROS As above  Objective:   VITALS:   Vitals:   07/23/18 1120 07/23/18 1238 07/23/18 1245 07/23/18 1328  BP: 118/65  106/63 122/82  Pulse: 65     Resp: 17  16 14   Temp: 97.8 F (36.6 C) 97.8 F (36.6 C) 97.7 F (36.5 C) 97.8 F (36.6 C)  TempSrc: Oral Oral Oral Oral  SpO2: 100%  99% 95%  Weight:      Height:        Estimated body mass index is 23.71 kg/m as calculated from the following:   Height as of this encounter: 6' 5.01" (1.956 m).   Weight as of this encounter: 90.7 kg.   Intake/Output      08/26 0701 - 08/27 0700 08/27 0701 - 08/28 0700   I.V. (mL/kg) 1313.7 (14.5)    Blood     Other 75    IV Piggyback 378.9    Total Intake(mL/kg) 1767.6 (19.5)    Urine (mL/kg/hr) 1000 (0.5)    Drains 220 100   Stool     Blood 10    Total Output 1230 100   Net +537.6 -100          LABS  Results for orders placed or performed during the hospital encounter of 07/16/18 (from the past 24 hour(s))  Hemoglobin and hematocrit, blood     Status: Abnormal   Collection Time: 07/22/18  2:49 PM  Result Value Ref Range   Hemoglobin 8.3 (L) 13.0 - 17.0 g/dL   HCT 28.0 (L) 39.0 - 52.0 %  Lactate dehydrogenase     Status: None   Collection Time: 07/22/18  2:49 PM  Result Value Ref Range   LDH 100 98 - 192 U/L  Haptoglobin     Status: Abnormal   Collection Time: 07/22/18  2:49 PM  Result Value Ref Range   Haptoglobin 228 (H) 34 - 200 mg/dL  Reticulocytes     Status: Abnormal   Collection Time: 07/22/18  2:49 PM  Result Value Ref Range   Retic Ct Pct 5.3 (H) 0.4 - 3.1 %   RBC. 3.29 (L) 4.22 - 5.81 MIL/uL   Retic Count, Absolute 174.4 19.0 - 186.0 K/uL  Save  smear     Status: None   Collection Time: 07/22/18  2:49 PM  Result Value Ref Range   Smear Review SMEAR STAINED AND AVAILABLE FOR REVIEW   CBC     Status: Abnormal   Collection Time: 07/23/18  5:26 AM  Result Value Ref Range   WBC 14.5 (H) 4.0 - 10.5 K/uL   RBC 2.74 (L) 4.22 - 5.81 MIL/uL   Hemoglobin 7.1 (L) 13.0 - 17.0 g/dL   HCT 23.9 (L) 39.0 - 52.0 %   MCV 87.2 78.0 - 100.0 fL   MCH 25.9 (L) 26.0 - 34.0 pg   MCHC 29.7 (L) 30.0 - 36.0 g/dL   RDW 25.2 (H) 11.5 - 15.5 %   Platelets 489 (H) 150 - 400 K/uL  Basic metabolic panel     Status: Abnormal   Collection Time: 07/23/18  5:26 AM  Result Value Ref Range   Sodium 141 135 - 145  mmol/L   Potassium 4.6 3.5 - 5.1 mmol/L   Chloride 111 98 - 111 mmol/L   CO2 25 22 - 32 mmol/L   Glucose, Bld 150 (H) 70 - 99 mg/dL   BUN 18 6 - 20 mg/dL   Creatinine, Ser 0.92 0.61 - 1.24 mg/dL   Calcium 8.8 (L) 8.9 - 10.3 mg/dL   GFR calc non Af Amer >60 >60 mL/min   GFR calc Af Amer >60 >60 mL/min   Anion gap 5 5 - 15  Prepare RBC     Status: None   Collection Time: 07/23/18 11:25 AM  Result Value Ref Range   Order Confirmation      ORDER PROCESSED BY BLOOD BANK Performed at [facility], [place] [address] [place], [place] [address]      PHYSICAL EXAM:   Gen: awake and alert, NAD, resting comfortably in bed  Ext:       Left Lower extremity   Veraflo to L hip functioning  Vac with good seal   No acute changes noted  prevena stable to L ankle   Assessment/Plan: 1 Day Post-Op   Principal Problem:   Osteomyelitis ([place]) Active Problems:   Paraplegia following spinal cord injury ([place])   Neurogenic bladder   Recurrent UTI   Wound infection   Cellulitis of hip, left   Sacral decubitus ulcer   Anti-infectives (From admission, onward)   Start     Dose/Rate Route Frequency Ordered Stop   07/22/18 1130  ceFEPIme (MAXIPIME) 1 g in sodium chloride 0.9 % 100 mL IVPB  Status:  Discontinued     1 g 200 mL/hr over 30 Minutes  Intravenous To Surgery 07/22/18 1126 07/22/18 1244   07/19/18 2000  vancomycin (VANCOCIN) IVPB 1000 mg/200 mL premix  Status:  Discontinued     1,000 mg 200 mL/hr over 60 Minutes Intravenous Every 12 hours 07/19/18 1854 07/19/18 1857   07/19/18 1930  vancomycin (VANCOCIN) IVPB 1000 mg/200 mL premix     1,000 mg 200 mL/hr over 60 Minutes Intravenous Every 12 hours 07/19/18 1857     07/19/18 1400  ceFAZolin (ANCEF) IVPB 2g/100 mL premix  Status:  Discontinued     2 g 200 mL/hr over 30 Minutes Intravenous  Once 07/19/18 1355 07/20/18 1253   07/19/18 1218  ceFAZolin (ANCEF) 2-4 GM/100ML-% IVPB    Note to Pharmacy:  Luciana Axe   : cabinet override      07/19/18 1218 07/20/18 0029   07/17/18 0600  vancomycin (VANCOCIN) IVPB 1000 mg/200 mL premix  Status:  Discontinued     1,000 mg 200 mL/hr over 60 Minutes Intravenous Every 8 hours 07/16/18 1944 07/19/18 1854   07/17/18 0500  vancomycin (VANCOCIN) IVPB 1000 mg/200 mL premix  Status:  Discontinued     1,000 mg 200 mL/hr over 60 Minutes Intravenous Every 8 hours 07/16/18 1855 07/16/18 1944   07/17/18 0300  ceFEPIme (MAXIPIME) 1 g in sodium chloride 0.9 % 100 mL IVPB  Status:  Discontinued     1 g 200 mL/hr over 30 Minutes Intravenous Every 8 hours 07/16/18 1855 07/16/18 2347   07/17/18 0300  ceFEPIme (MAXIPIME) 2 g in sodium chloride 0.9 % 100 mL IVPB  Status:  Discontinued     2 g 200 mL/hr over 30 Minutes Intravenous Every 8 hours 07/17/18 0014 07/17/18 0019   07/17/18 0300  ceFEPIme (MAXIPIME) 1 g in sodium chloride 0.9 % 100 mL IVPB     1 g 200  mL/hr over 30 Minutes Intravenous Every 8 hours 07/17/18 0019     07/16/18 2345  cefTRIAXone (ROCEPHIN) 2 g in sodium chloride 0.9 % 100 mL IVPB  Status:  Discontinued     2 g 200 mL/hr over 30 Minutes Intravenous Every 24 hours 07/16/18 2343 07/16/18 2347   07/16/18 2345  metroNIDAZOLE (FLAGYL) tablet 500 mg     500 mg Oral Every 8 hours 07/16/18 2343     07/16/18 2000  vancomycin (VANCOCIN)  2,000 mg in sodium chloride 0.9 % 500 mL IVPB     2,000 mg 250 mL/hr over 120 Minutes Intravenous  Once 07/16/18 1944 07/16/18 2209   07/16/18 1945  vancomycin (VANCOCIN) IVPB 1000 mg/200 mL premix  Status:  Discontinued     1,000 mg 200 mL/hr over 60 Minutes Intravenous  Once 07/16/18 1855 07/16/18 1944   07/16/18 1845  ceFEPIme (MAXIPIME) 2 g in sodium chloride 0.9 % 100 mL IVPB     2 g 200 mL/hr over 30 Minutes Intravenous  Once 07/16/18 1832 07/16/18 1909   07/16/18 1845  metroNIDAZOLE (FLAGYL) IVPB 500 mg  Status:  Discontinued     500 mg 100 mL/hr over 60 Minutes Intravenous Every 8 hours 07/16/18 1832 07/16/18 2343   07/16/18 1845  vancomycin (VANCOCIN) IVPB 1000 mg/200 mL premix  Status:  Discontinued     1,000 mg 200 mL/hr over 60 Minutes Intravenous  Once 07/16/18 1832 07/16/18 1944    .  POD/HD#: 2  32 y/o male, paraplegic with chronic L hip ulcer and osteomyelitis, L ankle ulcer   -chronic L hip ulcer and osteomyelitis, L ankle ulcer   S/p I&D x 2   Veraflo working well, will NOT return to OR Wednesday 8/28  Return to the OR on 8/30 for repeat I&D L hip and ankle     - ID:   Continue per ID recs   - Dispo:  OR Friday with ortho    Jari Pigg, PA-C [facility] [phone] (P) [phone] (O) [phone] (C) 07/23/2018, 2:07 PM

## 2018-07-23 NOTE — Progress Notes (Signed)
Nutrition Follow-up  DOCUMENTATION CODES:   Not applicable  INTERVENTION:   Ensure Enlive po TID, each supplement provides 350 kcal and 20 grams of protein  MVI  D/C Juven  Add HS snack  Encouraged bowl regimen   NUTRITION DIAGNOSIS:   Increased nutrient needs related to wound healing as evidenced by estimated needs.  Ongoing.   GOAL:   Patient will meet greater than or equal to 90% of their needs  Progressing  MONITOR:   PO intake, Supplement acceptance, Skin  ASSESSMENT:   Sean Horton is a 32 y.o. male presenting with fever and tachycardia . PMH is significant for paraplegia 2/2 T3 spinal cord injury, chronic indwelling foley,   Pt reports constipation impacting his appetite but is not willing to take his miralax as he is afraid that it will cause loose stool. Per pt he drinks water at home to have regular BM's but his intake here has been decreased. Pt also refuses his Juven and is not willing to drink them due to taste. His intake of Ensure has also been decreased due to constipation. Pt also reports not liking the food options here. He is able to find food he likes at breakfast but lunch and dinner is more of a struggle. We reviewed menu alternates for lunch and dinner and will order him a bedtime snack.    VAC x 2: 220 total out x 24 hr  Medications reviewed and include: colace BID, MVI, miralax BID (refused), zinc sulfate 220 mg daily started 8/21  Labs reviewed  Diet Order:   Diet Order            Diet NPO time specified  Diet effective midnight        Diet regular Room service appropriate? Yes; Fluid consistency: Thin  Diet effective now              EDUCATION NEEDS:   Education needs have been addressed  Skin:  Skin Assessment: Skin Integrity Issues: Skin Integrity Issues:: Wound VAC Stage IV: lt trochanter Unstageable: rt/lt lateral malleolus Wound Vac: ankle and hip  Last BM:  8/24 - medications ordered  Height:   Ht Readings from  Last 1 Encounters:  07/22/18 6' 5.01" (1.956 m)    Weight:   Wt Readings from Last 1 Encounters:  07/22/18 90.7 kg    Ideal Body Weight:  86.5 kg  BMI:  Body mass index is 23.71 kg/m.  Estimated Nutritional Needs:   Kcal:  2400-2600  Protein:  130-145 grams  Fluid:  > 2.4 L  Maylon Peppers RD, LDN, CNSC [phone] Pager [phone] After Hours Pager
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
## 2018-07-23 NOTE — Progress Notes (Signed)
[facility] for Infectious Disease  Date of Admission:  07/16/2018 Total days of antibiotics: 7    (Vancomycin, cefepime and metronidazole)  ASSESSMENT: Patient is 32 yo male admitted for infection of chronic left hip wound and found to have underlying osteomyelitis of sacrum, coccyx, acetabulum and greater trochanter on MRI. Patient had debridement and and wound vac change yesterday by Ortho without  Any complications. Culture from surgical specimen (8/23) continue to show multiple species . Blood cultures have also been negative. He continue to be on triple therapy (see above) has has stable leukocytosis (WBCs 14.5) and afebrile with remaining vitals within normal limits. Patient is scheduled for repeat debridement and wound vac change tomorrow (8/28) with Dr.Duda.   PLAN: 1. Patient will likely continue IV antibiotic on current regimen as initially plan for a total of 6 wks while awaiting final results on wound specimen . PICC in place. 2. Will follow up with Ortho team after tomorrow procedure. 3. Evaluated by plastic surgery not a candidate for flap surgery with current infection, complicated social situation and patient refusal to undergo procedure at this time.  4. Wound care for left axilla superficial lesion 5. Follow up left malleolus wound, prevena wound vac in place.  Principal Problem:   Osteomyelitis ([place]) Active Problems:   Paraplegia following spinal cord injury ([place])   Neurogenic bladder   Recurrent UTI   Wound infection   Cellulitis of hip, left   Sacral decubitus ulcer   Scheduled Meds: . sodium chloride   Intravenous Once  . sodium chloride   Intravenous Once  . baclofen  40 mg Oral BID  . Chlorhexidine Gluconate Cloth  6 each Topical Daily  . docusate sodium  100 mg Oral BID  . DULoxetine  60 mg Oral BID  . feeding supplement (ENSURE ENLIVE)  237 mL Oral TID BM  . metroNIDAZOLE  500 mg Oral Q8H  . morphine   Intravenous Q4H  . multivitamin  with minerals  1 tablet Oral Daily  . mupirocin ointment  1 application Nasal BID  . nutrition supplement (JUVEN)  1 packet Oral BID BM  . oxybutynin  5 mg Oral TID  . pantoprazole  40 mg Oral Daily  . polyethylene glycol  17 g Oral BID  . pregabalin  300 mg Oral BID  . tiZANidine  2 mg Oral Daily  . zinc sulfate  220 mg Oral Daily   Continuous Infusions: . sodium chloride Stopped (07/22/18 0649)  . sodium chloride Stopped (07/22/18 0403)  . ceFEPime (MAXIPIME) IV 1 g (07/23/18 1059)  . lactated ringers 10 mL/hr at 07/19/18 1148  . lactated ringers Stopped (07/19/18 2230)  . lactated ringers 10 mL/hr at 07/22/18 0959  . vancomycin 1,000 mg (07/23/18 0645)   PRN Meds:.sodium chloride, diphenhydrAMINE **OR** diphenhydrAMINE, ibuprofen, naloxone **AND** sodium chloride flush, ondansetron (ZOFRAN) IV, promethazine, senna-docusate, sodium chloride flush   SUBJECTIVE: Patient is still complaining of left hip pain, he is otherwise feeling better. He denies any fever, chills, nausea, vomiting. Appetite is good.   Review of Systems: Review of Systems  Constitutional: Negative for chills and fever.  HENT: Negative.   Eyes: Negative.   Respiratory: Negative.   Cardiovascular: Negative for chest pain, palpitations and leg swelling.  Gastrointestinal: Negative for nausea and vomiting.  Genitourinary: Negative.   Musculoskeletal: Positive for joint pain.  Skin:       Skin breakdown left axilla  Neurological: Negative.   Endo/Heme/Allergies: Negative.  No Known Allergies  OBJECTIVE: Vitals:   07/22/18 2043 07/23/18 0015 07/23/18 0804 07/23/18 0823  BP: 115/72 109/73 117/77 125/81  Pulse: 88 89 63 75  Resp: 18 17 11 20   Temp:   98 F (36.7 C) 97.7 F (36.5 C)  TempSrc:   Oral Oral  SpO2: 100% 100% 99% 99%  Weight:      Height:       Body mass index is 23.71 kg/m.  Physical Exam General: NAD, pleasant, able to participate in exam Cardiac: RRR, normal heart sounds, no  murmurs. 2+ radial and PT pulses bilaterally Respiratory: CTAB, normal effort, No wheezes, rales or rhonchi Abdomen: soft, nontender, nondistended, no hepatic or splenomegaly, +BS Extremities: wound vac in place left hip and left lateral malleolus Skin: warm and dry, left axillary lesion, no drainage, superficial with surrounding granulation tissue. Neuro: alert and oriented x4, paraplegic, UE normal strength and sensation intact Psych: Normal affect and mood  Lab Results Lab Results  Component Value Date   WBC 14.5 (H) 07/23/2018   HGB 7.1 (L) 07/23/2018   HCT 23.9 (L) 07/23/2018   MCV 87.2 07/23/2018   PLT 489 (H) 07/23/2018    Lab Results  Component Value Date   CREATININE 0.92 07/23/2018   BUN 18 07/23/2018   NA 141 07/23/2018   K 4.6 07/23/2018   CL 111 07/23/2018   CO2 25 07/23/2018    Lab Results  Component Value Date   ALT 17 07/17/2018   AST 46 (H) 07/17/2018   ALKPHOS 146 (H) 07/17/2018   BILITOT 0.5 07/17/2018     Microbiology: Recent Results (from the past 240 hour(s))  Blood Culture (routine x 2)     Status: None   Collection Time: 07/16/18  5:10 PM  Result Value Ref Range Status   Specimen Description BLOOD RIGHT ANTECUBITAL  Final   Special Requests   Final    BOTTLES DRAWN AEROBIC AND ANAEROBIC Blood Culture adequate volume   Culture   Final    NO GROWTH 5 DAYS Performed at [facility], [place] [address] [place], [place] [address]    Report Status 07/21/2018 FINAL  Final  Blood Culture (routine x 2)     Status: None   Collection Time: 07/16/18  5:19 PM  Result Value Ref Range Status   Specimen Description BLOOD RIGHT WRIST IV  Final   Special Requests   Final    BOTTLES DRAWN AEROBIC AND ANAEROBIC Blood Culture adequate volume   Culture   Final    NO GROWTH 5 DAYS Performed at [facility], [place] [address], [place], [place] [address]    Report Status 07/21/2018 FINAL  Final  MRSA PCR Screening     Status: None   Collection Time:  07/17/18 12:33 AM  Result Value Ref Range Status   MRSA by PCR NEGATIVE NEGATIVE Final    Comment:        The GeneXpert MRSA Assay (FDA approved for NASAL specimens only), is one component of a comprehensive MRSA colonization surveillance program. It is not intended to diagnose MRSA infection nor to guide or monitor treatment for MRSA infections. Performed at [facility], [place] [address] [place], [place] [address]   Aerobic/Anaerobic Culture (surgical/deep wound)     Status: None (Preliminary result)   Collection Time: 07/19/18  2:45 PM  Result Value Ref Range Status   Specimen Description HIP LEFT PROX [facility]  Final   Special Requests NONE  Final  Gram Stain   Final    RARE WBC PRESENT,BOTH PMN AND MONONUCLEAR NO ORGANISMS SEEN Performed at [facility], [place] [address], [place], [place] [address]    Culture   Final    RARE STAPHYLOCOCCUS AUREUS NO ANAEROBES ISOLATED; CULTURE IN PROGRESS FOR 5 DAYS    Report Status PENDING  Incomplete  Surgical pcr screen     Status: Abnormal   Collection Time: 07/22/18 12:58 AM  Result Value Ref Range Status   MRSA, PCR NEGATIVE NEGATIVE Final   Staphylococcus aureus POSITIVE (A) NEGATIVE Final    Comment: (NOTE) The Xpert SA Assay (FDA approved for NASAL specimens in patients 31 years of age and older), is one component of a comprehensive surveillance program. It is not intended to diagnose infection nor to guide or monitor treatment.      Marjie Skiff, MD [place], PGY-3   07/23/2018, 11:08 AM

## 2018-07-24 DIAGNOSIS — L89150 Pressure ulcer of sacral region, unstageable: Secondary | ICD-10-CM

## 2018-07-24 LAB — BASIC METABOLIC PANEL
Anion gap: 5 (ref 5–15)
BUN: 17 mg/dL (ref 6–20)
CALCIUM: 8.7 mg/dL — AB (ref 8.9–10.3)
CHLORIDE: 108 mmol/L (ref 98–111)
CO2: 26 mmol/L (ref 22–32)
CREATININE: 0.85 mg/dL (ref 0.61–1.24)
GFR calc non Af Amer: >60 mL/min (ref >60)
GLUCOSE: 125 mg/dL — AB (ref 70–99)
Potassium: 4.3 mmol/L (ref 3.5–5.1)
Sodium: 139 mmol/L (ref 135–145)

## 2018-07-24 LAB — CBC WITH DIFFERENTIAL/PLATELET
BASOS ABS: 0.1 10*3/uL (ref 0.0–0.1)
BASOS PCT: 1 %
EOS PCT: 4 %
Eosinophils Absolute: 0.6 10*3/uL (ref 0.0–0.7)
HEMATOCRIT: 28.3 % — AB (ref 39.0–52.0)
HEMOGLOBIN: 8.5 g/dL — AB (ref 13.0–17.0)
LYMPHS ABS: 3.9 10*3/uL (ref 0.7–4.0)
LYMPHS PCT: 28 %
MCH: 26.4 pg (ref 26.0–34.0)
MCHC: 30 g/dL (ref 30.0–36.0)
MCV: 87.9 fL (ref 78.0–100.0)
MONOS PCT: 6 %
Monocytes Absolute: 0.8 10*3/uL (ref 0.1–1.0)
NEUTROS ABS: 8.5 10*3/uL — AB (ref 1.7–7.7)
Neutrophils Relative %: 61 %
Platelets: 548 10*3/uL — ABNORMAL HIGH (ref 150–400)
RBC: 3.22 MIL/uL — ABNORMAL LOW (ref 4.22–5.81)
RDW: 25 % — AB (ref 11.5–15.5)
WBC: 13.9 10*3/uL — ABNORMAL HIGH (ref 4.0–10.5)

## 2018-07-24 LAB — BPAM RBC
BLOOD PRODUCT EXPIRATION DATE: 201909232359
Blood Product Expiration Date: 201909022359
ISSUE DATE / TIME: 201908252115
ISSUE DATE / TIME: 201908271302
UNIT TYPE AND RH: 5100
UNIT TYPE AND RH: 5100

## 2018-07-24 LAB — TYPE AND SCREEN
ABO/RH(D): O POS
ANTIBODY SCREEN: NEGATIVE
UNIT DIVISION: 0
Unit division: 0

## 2018-07-24 LAB — AEROBIC/ANAEROBIC CULTURE (SURGICAL/DEEP WOUND)

## 2018-07-24 LAB — PATHOLOGIST SMEAR REVIEW

## 2018-07-24 LAB — AEROBIC/ANAEROBIC CULTURE W GRAM STAIN (SURGICAL/DEEP WOUND)

## 2018-07-24 MED ORDER — SODIUM CHLORIDE 0.9 % IV SOLN
INTRAVENOUS | Status: AC
  Administered 2018-07-24: 23:00:00 via INTRAVENOUS

## 2018-07-24 MED ORDER — SODIUM CHLORIDE 0.9 % IV BOLUS
500.0000 mL | Freq: Once | INTRAVENOUS | Status: AC
  Administered 2018-07-24: 500 mL via INTRAVENOUS

## 2018-07-24 MED ORDER — METRONIDAZOLE 500 MG PO TABS
500.0000 mg | ORAL_TABLET | Freq: Three times a day (TID) | ORAL | Status: DC
  Administered 2018-07-24 – 2018-07-26 (×6): 500 mg via ORAL
  Filled 2018-07-24 (×6): qty 1

## 2018-07-24 MED ORDER — HYDROMORPHONE 1 MG/ML IV SOLN
INTRAVENOUS | Status: DC
  Administered 2018-07-25: 4.4 mg via INTRAVENOUS
  Filled 2018-07-24: qty 25

## 2018-07-24 NOTE — Progress Notes (Signed)
Peripherally Inserted Central Catheter/Midline Placement  The IV Nurse has discussed with the patient and/or persons authorized to consent for the patient, the purpose of this procedure and the potential benefits and risks involved with this procedure.  The benefits include less needle sticks, lab draws from the catheter, and the patient may be discharged home with the catheter. Risks include, but not limited to, infection, bleeding, blood clot (thrombus formation), and puncture of an artery; nerve damage and irregular heartbeat and possibility to perform a PICC exchange if needed/ordered by physician.  Alternatives to this procedure were also discussed.  Bard Power PICC patient education guide, fact sheet on infection prevention and patient information card has been provided to patient /or left at bedside.    PICC/Midline Placement Documentation  PICC Double Lumen 07/24/18 PICC Right Brachial 48 cm 1 cm (Active)  Indication for Insertion or Continuance of Line Prolonged intravenous therapies 07/24/2018  8:15 AM  Exposed Catheter (cm) 1 cm 07/24/2018  8:15 AM  Site Assessment Clean;Dry;Intact 07/24/2018  8:15 AM  Lumen #1 Status Flushed;Blood return noted 07/24/2018  8:15 AM  Lumen #2 Status Flushed;Blood return noted 07/24/2018  8:15 AM  Dressing Type Transparent 07/24/2018  8:15 AM  Dressing Status Clean;Dry;Intact;Antimicrobial disc in place 07/24/2018  8:15 AM  Dressing Intervention New dressing 07/24/2018  8:15 AM  Dressing Change Due 07/31/18 07/24/2018  8:15 AM   Current PICC is an Exchanged PICC from a Single Lumen PICC previously placed.    Virgilio Belling 07/24/2018, 8:30 AM

## 2018-07-24 NOTE — Care Management Note (Signed)
Case Management Note  Patient Details  Name: Sean Horton MRN: [id number] Date of Birth: September 26, 1986  Subjective/Objective:     Infected Lhip wound/osteomyelitis. Hx of paraplegia 2/2 T3 spinal cord injury, chronic indwelling foley. From home alone. Mom assists and is available on weekends.Pt with limited support. Concern about dog while in hospital....dog boarded, cost expensive. Pt really wants to transition to home once medically ready.  Duffy Dantonio (Mother)     [phone]      PCP: Lucila Maine  Action/Plan: Home health vs SNF. ID following , awaiting wound culture final results. NCM will continue to monitor for disposition needs. CSW aware of potential need for SNF placement  Expected Discharge Date:                  Expected Discharge Plan:  [place]  In-House Referral:     Discharge planning Services  CM Consult  Post Acute Care Choice:  Home Health Choice offered to:  Patient  DME Arranged:   DME Agency:  [facility], if needed for IV ABX home infusion. [facility] with [facility] following.  HH Arranged:    [place] Agency:  [place], if home health services are needed @ d/c.   Status of Service:  In process, will continue to follow  If discussed at Long Length of Stay Meetings, dates discussed:    Additional Comments:  [name], RN 07/24/2018, 3:35 PM

## 2018-07-24 NOTE — Plan of Care (Signed)
Pt resting comfortably in bed in no apparent distress. No complaints of pain at this time
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
## 2018-07-24 NOTE — Progress Notes (Signed)
[facility] for Infectious Disease  Date of Admission:  07/16/2018 Total days of antibiotics 8    (Vancomycin, cefepime and metronidazole)         ASSESSMENT: Sean Horton is a 32 yo admitted for infection of chronic left hip wound and found to have underlying osteomyelitis of sacrum, coccyx, acetabulum and greater trochanter on MRI. Patient had debridement and wound vac placement on 7/90 without complications. Patient is still triple therapy while awaiting final culture results expected today, WBCs continue to trend down and patient appears more comfortable. Patient was reevaluated this morning by ortho and is scheduled for I&D and wound vac on Friday (8/30) with Dr.Duda. PICC line has been replace from single to double lumen this morning. Discussed with patient SNF placement vs home with Home health. Patient is considering SNF at this time and will update [place] tomorrow.  PLAN: 1. Patient will likely continue IV antibiotic on current regimen as initially plan for a total of 6 wks while awaiting final results on wound specimen. Expect final results today with likely change in therapy regimen 2. Follow up on decision in regards to placement SNF vs Home Health. 3. Continue Wound care for left axilla wound. 4. Follow up left malleolus wound, prevena wound vac in place, will be reevaluate on Friday 8/30. 5. Pain control, currently on PCA pump with Dilaudid per primary  Principal Problem:   Osteomyelitis ([place]) Active Problems:   Paraplegia following spinal cord injury ([place])   Neurogenic bladder   Recurrent UTI   Wound infection   Cellulitis of hip, left   Sacral decubitus ulcer   Osteomyelitis of pelvic region ([place])   Scheduled Meds: . sodium chloride   Intravenous Once  . sodium chloride   Intravenous Once  . baclofen  40 mg Oral BID  . Chlorhexidine Gluconate Cloth  6 each Topical Daily  . docusate sodium  100 mg Oral BID  . DULoxetine  60 mg Oral BID  . feeding supplement  (ENSURE ENLIVE)  237 mL Oral TID BM  . HYDROmorphone   Intravenous Q4H  . multivitamin with minerals  1 tablet Oral Daily  . mupirocin ointment  1 application Nasal BID  . oxybutynin  5 mg Oral TID  . pantoprazole  40 mg Oral Daily  . polyethylene glycol  17 g Oral BID  . pregabalin  300 mg Oral BID  . tiZANidine  2 mg Oral Daily  . zinc sulfate  220 mg Oral Daily   Continuous Infusions: . sodium chloride 130 mL/hr at 07/24/18 0944  . sodium chloride Stopped (07/22/18 0403)  . ceFEPime (MAXIPIME) IV Stopped (07/24/18 0304)  . lactated ringers 10 mL/hr at 07/19/18 1148  . lactated ringers Stopped (07/19/18 2230)  . lactated ringers 10 mL/hr at 07/22/18 0959   PRN Meds:.sodium chloride, barrier cream, diphenhydrAMINE **OR** diphenhydrAMINE, ibuprofen, naloxone **AND** sodium chloride flush, ondansetron (ZOFRAN) IV, promethazine, senna-docusate, sodium chloride flush   SUBJECTIVE: Patient feeling better this morning, pain is better controlled with PCA. Continue to denies any chills, fever, nausea, vomiting. Good appetite.  Review of Systems: ROS Constitutional: Negative for chills and fever.  HENT: Negative.   Eyes: Negative.   Respiratory: Negative.   Cardiovascular: Negative for chest pain, palpitations and leg swelling.  Gastrointestinal: Negative for nausea and vomiting.  Genitourinary: Negative.   Musculoskeletal: Positive for joint pain.  Skin:       Skin breakdown left axilla  Neurological: Negative.   Endo/Heme/Allergies: Negative.  No Known Allergies  OBJECTIVE: Vitals:   07/24/18 0003 07/24/18 0401 07/24/18 0533 07/24/18 0731  BP:   132/86   Pulse:   69   Resp: 11 12 15 15   Temp:   98 F (36.7 C)   TempSrc:   Oral   SpO2: 99% 99% 100% 99%  Weight:      Height:       Body mass index is 23.71 kg/m.  Physical Exam   General: NAD, pleasant, able to participate in exam Cardiac: RRR, normal heart sounds, no murmurs. 2+ radial and PT pulses  bilaterally Respiratory: CTAB, normal effort, No wheezes, rales or rhonchi Abdomen: soft, nontender, nondistended, no hepatic or splenomegaly, +BS Extremities: wound vac in place left hip and left lateral malleolus Skin: warm and dry, left axillary lesion, no drainage, superficial with surrounding granulation tissue. Neuro: alert and oriented x4, paraplegic, UE normal strength and sensation intact Psych: Normal affect and mood  Lab Results Lab Results  Component Value Date   WBC 13.9 (H) 07/24/2018   HGB 8.5 (L) 07/24/2018   HCT 28.3 (L) 07/24/2018   MCV 87.9 07/24/2018   PLT 548 (H) 07/24/2018    Lab Results  Component Value Date   CREATININE 0.85 07/24/2018   BUN 17 07/24/2018   NA 139 07/24/2018   K 4.3 07/24/2018   CL 108 07/24/2018   CO2 26 07/24/2018    Lab Results  Component Value Date   ALT 17 07/17/2018   AST 46 (H) 07/17/2018   ALKPHOS 146 (H) 07/17/2018   BILITOT 0.5 07/17/2018     Microbiology: Recent Results (from the past 240 hour(s))  Blood Culture (routine x 2)     Status: None   Collection Time: 07/16/18  5:10 PM  Result Value Ref Range Status   Specimen Description BLOOD RIGHT ANTECUBITAL  Final   Special Requests   Final    BOTTLES DRAWN AEROBIC AND ANAEROBIC Blood Culture adequate volume   Culture   Final    NO GROWTH 5 DAYS Performed at [facility], [place] [address] [place], [place] [address]    Report Status 07/21/2018 FINAL  Final  Blood Culture (routine x 2)     Status: None   Collection Time: 07/16/18  5:19 PM  Result Value Ref Range Status   Specimen Description BLOOD RIGHT WRIST IV  Final   Special Requests   Final    BOTTLES DRAWN AEROBIC AND ANAEROBIC Blood Culture adequate volume   Culture   Final    NO GROWTH 5 DAYS Performed at [facility], [place] [name], [place], [place] [address]    Report Status 07/21/2018 FINAL  Final  MRSA PCR Screening     Status: None   Collection Time: 07/17/18 12:33 AM  Result Value Ref  Range Status   MRSA by PCR NEGATIVE NEGATIVE Final    Comment:        The GeneXpert MRSA Assay (FDA approved for NASAL specimens only), is one component of a comprehensive MRSA colonization surveillance program. It is not intended to diagnose MRSA infection nor to guide or monitor treatment for MRSA infections. Performed at [facility], [place] [address], [place], [place] [address]   Aerobic/Anaerobic Culture (surgical/deep wound)     Status: None (Preliminary result)   Collection Time: 07/19/18  2:45 PM  Result Value Ref Range Status   Specimen Description HIP LEFT PROX [facility]  Final   Special Requests NONE  Final  Gram Stain   Final    RARE WBC PRESENT,BOTH PMN AND MONONUCLEAR NO ORGANISMS SEEN    Culture   Final    RARE STAPHYLOCOCCUS AUREUS NO ANAEROBES ISOLATED; CULTURE IN PROGRESS FOR 5 DAYS    Report Status PENDING  Incomplete   Organism ID, Bacteria STAPHYLOCOCCUS AUREUS  Final      Susceptibility   Staphylococcus aureus - MIC*    CIPROFLOXACIN <=0.5 SENSITIVE Sensitive     ERYTHROMYCIN <=0.25 SENSITIVE Sensitive     GENTAMICIN <=0.5 SENSITIVE Sensitive     OXACILLIN <=0.25 SENSITIVE Sensitive     TETRACYCLINE <=1 SENSITIVE Sensitive     VANCOMYCIN 1 SENSITIVE Sensitive     TRIMETH/SULFA <=10 SENSITIVE Sensitive     CLINDAMYCIN <=0.25 SENSITIVE Sensitive     RIFAMPIN <=0.5 SENSITIVE Sensitive     Inducible Clindamycin Value in next row Sensitive      NEGATIVEPerformed at [place] [address] [place], [place] [address]    * RARE STAPHYLOCOCCUS AUREUS  Surgical pcr screen     Status: Abnormal   Collection Time: 07/22/18 12:58 AM  Result Value Ref Range Status   MRSA, PCR NEGATIVE NEGATIVE Final   Staphylococcus aureus POSITIVE (A) NEGATIVE Final    Comment: (NOTE) The Xpert SA Assay (FDA approved for NASAL specimens in patients 43 years of age and older), is one component of a comprehensive surveillance program. It is not intended to  diagnose infection nor to guide or monitor treatment.     Marjie Skiff, MD [place], PGY-3  07/24/2018, 11:46 AM

## 2018-07-24 NOTE — Progress Notes (Signed)
FPTS Interim Progress Note Patient with asymptomatic hypotension to 72/42, MAP 50. On Dilaudid PCA. He has been hypotensive at baseline but suspect opioids are contributing to worsened hypotension this evening.  - 500 ml bolus and recheck BP - if remains hypotensive, will give second 500 ml IVF bolus - start mIVF - switch to reduced dose PCA with hold parameters for hypotension. Next dose is at [place]. If still hypotensive may need to discontinue PCA altogether - hold lyrica and baclofen for now - will continue to monitor  Everrett Coombe, MD 07/24/2018, 10:09 PM PGY-3, [facility] Medicine Service pager [phone]

## 2018-07-25 DIAGNOSIS — L989 Disorder of the skin and subcutaneous tissue, unspecified: Secondary | ICD-10-CM

## 2018-07-25 DIAGNOSIS — Z960 Presence of urogenital implants: Secondary | ICD-10-CM

## 2018-07-25 DIAGNOSIS — M86652 Other chronic osteomyelitis, left thigh: Secondary | ICD-10-CM

## 2018-07-25 LAB — CBC WITH DIFFERENTIAL/PLATELET
BASOS ABS: 0.1 10*3/uL (ref 0.0–0.1)
Basophils Relative: 1 %
EOS ABS: 0.7 10*3/uL (ref 0.0–0.7)
Eosinophils Relative: 5 %
HCT: 30.1 % — ABNORMAL LOW (ref 39.0–52.0)
Hemoglobin: 8.9 g/dL — ABNORMAL LOW (ref 13.0–17.0)
LYMPHS PCT: 26 %
Lymphs Abs: 3.7 10*3/uL (ref 0.7–4.0)
MCH: 26.3 pg (ref 26.0–34.0)
MCHC: 29.6 g/dL — AB (ref 30.0–36.0)
MCV: 89.1 fL (ref 78.0–100.0)
Monocytes Absolute: 1 10*3/uL (ref 0.1–1.0)
Monocytes Relative: 7 %
NEUTROS PCT: 61 %
Neutro Abs: 8.6 10*3/uL — ABNORMAL HIGH (ref 1.7–7.7)
PLATELETS: 532 10*3/uL — AB (ref 150–400)
RBC: 3.38 MIL/uL — ABNORMAL LOW (ref 4.22–5.81)
RDW: 25.2 % — ABNORMAL HIGH (ref 11.5–15.5)
WBC: 14.1 10*3/uL — ABNORMAL HIGH (ref 4.0–10.5)

## 2018-07-25 LAB — BASIC METABOLIC PANEL
ANION GAP: 7 (ref 5–15)
BUN: 15 mg/dL (ref 6–20)
CALCIUM: 8.8 mg/dL — AB (ref 8.9–10.3)
CO2: 28 mmol/L (ref 22–32)
Chloride: 104 mmol/L (ref 98–111)
Creatinine, Ser: 0.81 mg/dL (ref 0.61–1.24)
Glucose, Bld: 98 mg/dL (ref 70–99)
Potassium: 4.6 mmol/L (ref 3.5–5.1)
SODIUM: 139 mmol/L (ref 135–145)

## 2018-07-25 MED ORDER — MAGNESIUM CITRATE PO SOLN
1.0000 | Freq: Once | ORAL | Status: AC
  Administered 2018-07-25: 1 via ORAL
  Filled 2018-07-25: qty 296

## 2018-07-25 MED ORDER — DIPHENHYDRAMINE HCL 50 MG/ML IJ SOLN: 12.5000 mg | Freq: Four times a day (QID) | INTRAMUSCULAR | Status: DC | PRN

## 2018-07-25 MED ORDER — GLYCERIN (LAXATIVE) 2.1 G RE SUPP
1.0000 | Freq: Once | RECTAL | Status: DC
  Filled 2018-07-25: qty 1

## 2018-07-25 MED ORDER — SODIUM CHLORIDE 0.9% FLUSH: 9.0000 mL | INTRAVENOUS | Status: DC | PRN

## 2018-07-25 MED ORDER — SODIUM CHLORIDE 0.9 % IV SOLN
INTRAVENOUS | Status: DC
  Administered 2018-07-25 – 2018-07-29 (×7): via INTRAVENOUS

## 2018-07-25 MED ORDER — HYDROMORPHONE 1 MG/ML IV SOLN
INTRAVENOUS | Status: DC
  Administered 2018-07-26: 0.6 mg via INTRAVENOUS
  Administered 2018-07-26: 6.9 mg via INTRAVENOUS
  Administered 2018-07-26: 25 mg via INTRAVENOUS
  Administered 2018-07-26: 4.8 mg via INTRAVENOUS
  Administered 2018-07-27: 25 mg via INTRAVENOUS
  Administered 2018-07-27: 3.6 mg via INTRAVENOUS
  Administered 2018-07-27 – 2018-07-28 (×3): 2.1 mg via INTRAVENOUS
  Administered 2018-07-28: 1.5 mg via INTRAVENOUS
  Administered 2018-07-28: 2.7 mg via INTRAVENOUS
  Administered 2018-07-28: 2.4 mg via INTRAVENOUS
  Administered 2018-07-28: 4.2 mg via INTRAVENOUS
  Administered 2018-07-28: 1.8 mg via INTRAVENOUS
  Administered 2018-07-29: 02:00:00 via INTRAVENOUS
  Administered 2018-07-29: 2.4 mg via INTRAVENOUS
  Administered 2018-07-29: 4.5 mg via INTRAVENOUS
  Filled 2018-07-25 (×5): qty 25

## 2018-07-25 MED ORDER — BACLOFEN 20 MG PO TABS
40.0000 mg | ORAL_TABLET | Freq: Two times a day (BID) | ORAL | Status: DC
  Administered 2018-07-25 – 2018-08-01 (×15): 40 mg via ORAL
  Filled 2018-07-25 (×15): qty 2

## 2018-07-25 MED ORDER — ONDANSETRON HCL 4 MG/2ML IJ SOLN: 4.0000 mg | Freq: Four times a day (QID) | INTRAMUSCULAR | Status: DC | PRN

## 2018-07-25 MED ORDER — DIPHENHYDRAMINE HCL 12.5 MG/5ML PO ELIX: 12.5000 mg | ORAL_SOLUTION | Freq: Four times a day (QID) | ORAL | Status: DC | PRN

## 2018-07-25 MED ORDER — PREGABALIN 100 MG PO CAPS
300.0000 mg | ORAL_CAPSULE | Freq: Two times a day (BID) | ORAL | Status: DC
  Administered 2018-07-25 – 2018-08-01 (×15): 300 mg via ORAL
  Filled 2018-07-25 (×3): qty 3
  Filled 2018-07-25: qty 12
  Filled 2018-07-25 (×3): qty 3
  Filled 2018-07-25: qty 12
  Filled 2018-07-25 (×7): qty 3

## 2018-07-25 MED ORDER — NALOXONE HCL 0.4 MG/ML IJ SOLN: 0.4000 mg | INTRAMUSCULAR | Status: DC | PRN

## 2018-07-25 NOTE — Progress Notes (Addendum)
Attending attestation I have seen and examined Sean Horton today.  I discussed his management with Dr. Andy Gauss and I am in agreement with his assessment and plan.  We will treat with IV ertapenem to complete 6 weeks of therapy for complex wound infection and pelvic osteomyelitis.  This will cover the MSSA cultured from his operative specimen as well as other gram-positive cocci, aerobic gram-negative rods and anaerobes that are likely to exist in his chronic left trochanteric wound.   [facility] for Infectious Disease  Date of Admission:  07/16/2018  Total days of antibiotics 9        Day 9 Cefepime         Day 9 Flagyl        Vancomycin d/ced on 8/28 ASSESSMENT: Sean Horton final culture results positive for pan sensitive staph aureus without anaerobes. Patient with known osteomyelitis seen on MRI and  scheduled for repeat washout and wound vac change tomorrow (8/30). Patient has a history of osteomyelitis requiring IV therapy and will have to complete a a total 6 week IV therapy. Patient is paraplegic and has an indwelling foley, he lives alone and being paraplegic, we have to be concern about possible re-finfection with intraabdominal bacteria from foley or possibly from fecal matter based on location of his wound. Patient will be discharge home and will need once a day dosage IV antibiotic covering staph aureus, anaerobes.  PLAN: 1. Recommend discontinuing current cefepime/flagyl regimen and starting patient on ertapenem 1g IV q24. 2. Patient will need home health, primary team will need to coordinate. Would also recommend wound care. 3. Follow up on ortho procedure findings tomorrow. 4. OPAT below see instructions   Principal Problem:   Osteomyelitis ([place]) Active Problems:   Paraplegia following spinal cord injury ([place])   Neurogenic bladder   Recurrent UTI   Wound infection   Cellulitis of hip, left   Sacral decubitus ulcer   Osteomyelitis of pelvic region  [facility])  Diagnosis: Osteomyelitis MSSA  Culture Result: MSSA pan sensitive   No Known Allergies  OPAT Orders Discharge antibiotics:  Ertapenem Per pharmacy protocol  Duration: 6 weeks End Date: 08/30/2018  [facility] Care Per Protocol:  Labs weekly while on IV antibiotics: _x_ CBC with differential __ BMP _x_ CMP __ CRP __ ESR  __ Please pull PIC at completion of IV antibiotics _x_ Please leave PIC in place until doctor has seen patient or been notified  Fax weekly labs to [phone]   Scheduled Meds: . sodium chloride   Intravenous Once  . sodium chloride   Intravenous Once  . baclofen  40 mg Oral BID  . Chlorhexidine Gluconate Cloth  6 each Topical Daily  . docusate sodium  100 mg Oral BID  . DULoxetine  60 mg Oral BID  . feeding supplement (ENSURE ENLIVE)  237 mL Oral TID BM  . HYDROmorphone   Intravenous Q4H  . metroNIDAZOLE  500 mg Oral Q8H  . multivitamin with minerals  1 tablet Oral Daily  . mupirocin ointment  1 application Nasal BID  . oxybutynin  5 mg Oral TID  . pantoprazole  40 mg Oral Daily  . polyethylene glycol  17 g Oral BID  . pregabalin  300 mg Oral BID  . tiZANidine  2 mg Oral Daily  . zinc sulfate  220 mg Oral Daily   Continuous Infusions: . sodium chloride Stopped (07/22/18 0403)  . ceFEPime (MAXIPIME) IV 1 g (07/25/18 1017)  . lactated ringers  10 mL/hr at 07/19/18 1148  . lactated ringers Stopped (07/19/18 2230)  . lactated ringers 10 mL/hr at 07/22/18 0959   PRN Meds:.sodium chloride, barrier cream, diphenhydrAMINE **OR** diphenhydrAMINE, ibuprofen, naloxone **AND** sodium chloride flush, ondansetron (ZOFRAN) IV, promethazine, senna-docusate, sodium chloride flush   SUBJECTIVE: Patient report worsening in his pain today. Patient was hypotensive yesterday evening and had PCA pump dilaudid dose decrease. Currently he rate the pain 6/10. Appetite is fair, no nausea, vomiting, fever or chills reported.  Review of  Systems: ROS Constitutional: Negative forchillsand fever.  HENT:Negative.  Eyes:Negative.  Respiratory:Negative.  Cardiovascular: Negative forchest pain,palpitationsand leg swelling.  Gastrointestinal: Negative fornauseaand vomiting.  Genitourinary:Negative.  Musculoskeletal: Positive forjoint pain.  Skin: Skin breakdown left axilla Neurological:Negative.  Endo/Heme/Allergies:Negative. No Known Allergies No Known Allergies  OBJECTIVE: Vitals:   07/25/18 0520 07/25/18 0729 07/25/18 1000 07/25/18 1345  BP: 138/84  111/67 106/67  Pulse: 71     Resp: 18 16    Temp: 98.1 F (36.7 C)     TempSrc: Oral     SpO2: 100%     Weight:      Height:       Body mass index is 23.71 kg/m.  Physical Exam   General: NAD, pleasant, able to participate in exam Cardiac: RRR, normal heart sounds, no murmurs. 2+ radial and PT pulses bilaterally Respiratory: CTAB, normal effort, No wheezes, rales or rhonchi Abdomen: soft, nontender, nondistended, no hepatic or splenomegaly, +BS Extremities:wound vac in place left hip and left lateral malleolus Skin:warm and dry, left axillary lesion, no drainage, superficial with surrounding granulation tissue. Neuro: alert and oriented x4,paraplegic, UE normal strength and sensation intact Psych:Normal affect and mood   Lab Results Lab Results  Component Value Date   WBC 14.1 (H) 07/25/2018   HGB 8.9 (L) 07/25/2018   HCT 30.1 (L) 07/25/2018   MCV 89.1 07/25/2018   PLT 532 (H) 07/25/2018    Lab Results  Component Value Date   CREATININE 0.81 07/25/2018   BUN 15 07/25/2018   NA 139 07/25/2018   K 4.6 07/25/2018   CL 104 07/25/2018   CO2 28 07/25/2018    Lab Results  Component Value Date   ALT 17 07/17/2018   AST 46 (H) 07/17/2018   ALKPHOS 146 (H) 07/17/2018   BILITOT 0.5 07/17/2018     Microbiology: Recent Results (from the past 240 hour(s))  Blood Culture (routine x 2)     Status: None   Collection  Time: 07/16/18  5:10 PM  Result Value Ref Range Status   Specimen Description BLOOD RIGHT ANTECUBITAL  Final   Special Requests   Final    BOTTLES DRAWN AEROBIC AND ANAEROBIC Blood Culture adequate volume   Culture   Final    NO GROWTH 5 DAYS Performed at [facility], [place] [address] [place], [place] [address]    Report Status 07/21/2018 FINAL  Final  Blood Culture (routine x 2)     Status: None   Collection Time: 07/16/18  5:19 PM  Result Value Ref Range Status   Specimen Description BLOOD RIGHT WRIST IV  Final   Special Requests   Final    BOTTLES DRAWN AEROBIC AND ANAEROBIC Blood Culture adequate volume   Culture   Final    NO GROWTH 5 DAYS Performed at [facility], [place] [address], [place], [place] [address]    Report Status 07/21/2018 FINAL  Final  MRSA PCR Screening     Status: None  Collection Time: 07/17/18 12:33 AM  Result Value Ref Range Status   MRSA by PCR NEGATIVE NEGATIVE Final    Comment:        The GeneXpert MRSA Assay (FDA approved for NASAL specimens only), is one component of a comprehensive MRSA colonization surveillance program. It is not intended to diagnose MRSA infection nor to guide or monitor treatment for MRSA infections. Performed at [facility], [place] [address], [place], [place] [address]   Aerobic/Anaerobic Culture (surgical/deep wound)     Status: None   Collection Time: 07/19/18  2:45 PM  Result Value Ref Range Status   Specimen Description HIP LEFT PROX [facility]  Final   Special Requests NONE  Final   Gram Stain   Final    RARE WBC PRESENT,BOTH PMN AND MONONUCLEAR NO ORGANISMS SEEN    Culture   Final    RARE STAPHYLOCOCCUS AUREUS NO ANAEROBES ISOLATED Performed at [facility], [place] [address] [place], [place] [address]    Report Status 07/24/2018 FINAL  Final   Organism ID, Bacteria STAPHYLOCOCCUS AUREUS  Final      Susceptibility   Staphylococcus aureus - MIC*    CIPROFLOXACIN <=0.5 SENSITIVE Sensitive      ERYTHROMYCIN <=0.25 SENSITIVE Sensitive     GENTAMICIN <=0.5 SENSITIVE Sensitive     OXACILLIN <=0.25 SENSITIVE Sensitive     TETRACYCLINE <=1 SENSITIVE Sensitive     VANCOMYCIN 1 SENSITIVE Sensitive     TRIMETH/SULFA <=10 SENSITIVE Sensitive     CLINDAMYCIN <=0.25 SENSITIVE Sensitive     RIFAMPIN <=0.5 SENSITIVE Sensitive     Inducible Clindamycin NEGATIVE Sensitive     * RARE STAPHYLOCOCCUS AUREUS  Surgical pcr screen     Status: Abnormal   Collection Time: 07/22/18 12:58 AM  Result Value Ref Range Status   MRSA, PCR NEGATIVE NEGATIVE Final   Staphylococcus aureus POSITIVE (A) NEGATIVE Final    Comment: (NOTE) The Xpert SA Assay (FDA approved for NASAL specimens in patients 77 years of age and older), is one component of a comprehensive surveillance program. It is not intended to diagnose infection nor to guide or monitor treatment.     Marjie Skiff, MD [place], PGY-3  07/25/2018, 2:07 PM

## 2018-07-25 NOTE — Progress Notes (Signed)
[facility]   Pt doing ok, eager to get back to OR  prevena and veraflo are functioning well   OR tomorrow at 0800 NPO after MN   Jari Pigg, PA-C [facility] [phone] (C) [phone] (O) 07/25/2018 9:54 AM

## 2018-07-25 NOTE — Progress Notes (Signed)
Pharmacy Antibiotic Stewardship Note  Sean Horton is a 12 YOM admitted on 07/16/18 with a chronic wound infection and pelvic osteomyelitis. He has a long history of antibiotic use and most recently has received vancomycin/cefepime/flagyl during this hospitalization in addition to resection of his femur and wound VAC placement on 8/23, debridement on 8/26, and has I&D planned again for 8/30.  Today I asked Mr. Meals about when his last outpatient IV antibiotic course was. He reports that it was a few years ago. Given the extended duration between IV antibiotic courses, his flora has likely reverted back to normal. After providing him with a few details about how IV antibiotics are administered as an outpatient, he feels he would be able to do this himself.  With Mr. Burlingame normalized flora and need for outpatient IV antibiotic therapy, would recommend switching from his current regimen of cefepime/flagyl to ertapenem 1g IV q24h and continuing this outpatient.  Jackson Latino, PharmD PGY1 Pharmacy Resident Phone [phone] 07/25/2018     11:09 AM
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
## 2018-07-25 NOTE — Progress Notes (Signed)
PHARMACY CONSULT NOTE FOR:  OUTPATIENT  PARENTERAL ANTIBIOTIC THERAPY (OPAT)  Indication: Osteomyelitis Regimen: Ertapenem 1g IV q24 End date: 08/30/18  IV antibiotic discharge orders are pended. To discharging provider:  please sign these orders via discharge navigator,  Select New Orders & click on the button choice - Manage This Unsigned Work.     Thank you for allowing pharmacy to be a part of this patient's care.  Onnie Boer, PharmD, [place], AAHIVP, CPP Infectious Disease Pharmacist Pager: [phone] 07/25/2018 6:03 PM
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
## 2018-07-25 NOTE — Plan of Care (Signed)
Pt resting comfortably in bed. Vitals signs WNL

## 2018-07-26 ENCOUNTER — Inpatient Hospital Stay (HOSPITAL_COMMUNITY): Payer: Medicaid Other | Admitting: Certified Registered"

## 2018-07-26 ENCOUNTER — Encounter (HOSPITAL_COMMUNITY): Payer: Self-pay | Admitting: *Deleted

## 2018-07-26 ENCOUNTER — Encounter (HOSPITAL_COMMUNITY)
Admission: EM | Disposition: A | Discharge status: Home Health | Payer: Self-pay | Source: Ambulatory Visit | Attending: Family Medicine

## 2018-07-26 DIAGNOSIS — T148XXA Other injury of unspecified body region, initial encounter: Secondary | ICD-10-CM

## 2018-07-26 DIAGNOSIS — L089 Local infection of the skin and subcutaneous tissue, unspecified: Secondary | ICD-10-CM

## 2018-07-26 HISTORY — PX: APPLICATION OF WOUND VAC: SHX5189

## 2018-07-26 HISTORY — PX: DRESSING CHANGE UNDER ANESTHESIA: SHX5237

## 2018-07-26 HISTORY — PX: I&D EXTREMITY: SHX5045

## 2018-07-26 HISTORY — PX: IRRIGATION AND DEBRIDEMENT FOOT: SHX6602

## 2018-07-26 HISTORY — PX: SKIN FULL THICKNESS GRAFT: SHX442

## 2018-07-26 LAB — BASIC METABOLIC PANEL
Anion gap: 6 (ref 5–15)
BUN: 18 mg/dL (ref 6–20)
CALCIUM: 8.8 mg/dL — AB (ref 8.9–10.3)
CO2: 27 mmol/L (ref 22–32)
CREATININE: 0.83 mg/dL (ref 0.61–1.24)
Chloride: 105 mmol/L (ref 98–111)
GFR calc non Af Amer: >60 mL/min (ref >60)
Glucose, Bld: 127 mg/dL — ABNORMAL HIGH (ref 70–99)
Potassium: 4.8 mmol/L (ref 3.5–5.1)
Sodium: 138 mmol/L (ref 135–145)

## 2018-07-26 LAB — CBC
HEMATOCRIT: 32 % — AB (ref 39.0–52.0)
HEMOGLOBIN: 9.4 g/dL — AB (ref 13.0–17.0)
MCH: 26.2 pg (ref 26.0–34.0)
MCHC: 29.4 g/dL — AB (ref 30.0–36.0)
MCV: 89.1 fL (ref 78.0–100.0)
Platelets: 528 10*3/uL — ABNORMAL HIGH (ref 150–400)
RBC: 3.59 MIL/uL — ABNORMAL LOW (ref 4.22–5.81)
RDW: 25.1 % — ABNORMAL HIGH (ref 11.5–15.5)
WBC: 14.8 10*3/uL — ABNORMAL HIGH (ref 4.0–10.5)

## 2018-07-26 LAB — HEMOGLOBIN AND HEMATOCRIT, BLOOD
HCT: 31.7 % — ABNORMAL LOW (ref 39.0–52.0)
Hemoglobin: 9.3 g/dL — ABNORMAL LOW (ref 13.0–17.0)

## 2018-07-26 SURGERY — IRRIGATION AND DEBRIDEMENT EXTREMITY
Anesthesia: General | Site: Hip | Laterality: Right

## 2018-07-26 MED ORDER — ONDANSETRON HCL 4 MG/2ML IJ SOLN
INTRAMUSCULAR | Status: AC
  Filled 2018-07-26: qty 2

## 2018-07-26 MED ORDER — PROPOFOL 10 MG/ML IV BOLUS
INTRAVENOUS | Status: AC
  Filled 2018-07-26: qty 20

## 2018-07-26 MED ORDER — ONDANSETRON HCL 4 MG/2ML IJ SOLN: 4.0000 mg | Freq: Once | INTRAMUSCULAR | Status: DC | PRN

## 2018-07-26 MED ORDER — SODIUM CHLORIDE 0.9 % IV SOLN
INTRAVENOUS | Status: DC | PRN
  Administered 2018-07-26: 40 ug/min via INTRAVENOUS

## 2018-07-26 MED ORDER — EPHEDRINE 5 MG/ML INJ
INTRAVENOUS | Status: AC
  Filled 2018-07-26: qty 20

## 2018-07-26 MED ORDER — FENTANYL CITRATE (PF) 250 MCG/5ML IJ SOLN
INTRAMUSCULAR | Status: AC
  Filled 2018-07-26: qty 5

## 2018-07-26 MED ORDER — PHENYLEPHRINE 40 MCG/ML (10ML) SYRINGE FOR IV PUSH (FOR BLOOD PRESSURE SUPPORT)
PREFILLED_SYRINGE | INTRAVENOUS | Status: AC
  Filled 2018-07-26: qty 20

## 2018-07-26 MED ORDER — LIDOCAINE 2% (20 MG/ML) 5 ML SYRINGE
INTRAMUSCULAR | Status: AC
  Filled 2018-07-26: qty 5

## 2018-07-26 MED ORDER — FENTANYL CITRATE (PF) 100 MCG/2ML IJ SOLN
25.0000 ug | INTRAMUSCULAR | Status: DC | PRN
  Administered 2018-07-26 (×3): 50 ug via INTRAVENOUS

## 2018-07-26 MED ORDER — SODIUM CHLORIDE 0.9 % IV SOLN
1.0000 g | INTRAVENOUS | Status: DC
  Administered 2018-07-26 – 2018-08-01 (×7): 1000 mg via INTRAVENOUS
  Filled 2018-07-26 (×7): qty 1

## 2018-07-26 MED ORDER — FENTANYL CITRATE (PF) 100 MCG/2ML IJ SOLN
INTRAMUSCULAR | Status: AC
  Administered 2018-07-26: 11:00:00
  Filled 2018-07-26: qty 2

## 2018-07-26 MED ORDER — OXYCODONE HCL 5 MG/5ML PO SOLN: 5.0000 mg | Freq: Once | ORAL | Status: DC | PRN

## 2018-07-26 MED ORDER — EPHEDRINE 5 MG/ML INJ
INTRAVENOUS | Status: AC
  Filled 2018-07-26: qty 10

## 2018-07-26 MED ORDER — LACTATED RINGERS IV SOLN
INTRAVENOUS | Status: DC
  Administered 2018-07-26: 08:00:00 via INTRAVENOUS

## 2018-07-26 MED ORDER — OXYCODONE HCL 5 MG PO TABS: 5.0000 mg | ORAL_TABLET | Freq: Once | ORAL | Status: DC | PRN

## 2018-07-26 MED ORDER — GLYCOPYRROLATE PF 0.2 MG/ML IJ SOSY
PREFILLED_SYRINGE | INTRAMUSCULAR | Status: AC
  Filled 2018-07-26: qty 1

## 2018-07-26 MED ORDER — SUGAMMADEX SODIUM 200 MG/2ML IV SOLN
INTRAVENOUS | Status: DC | PRN
  Administered 2018-07-26: 200 mg via INTRAVENOUS

## 2018-07-26 MED ORDER — PHENYLEPHRINE 40 MCG/ML (10ML) SYRINGE FOR IV PUSH (FOR BLOOD PRESSURE SUPPORT)
PREFILLED_SYRINGE | INTRAVENOUS | Status: DC | PRN
  Administered 2018-07-26 (×2): 80 ug via INTRAVENOUS

## 2018-07-26 MED ORDER — FENTANYL CITRATE (PF) 100 MCG/2ML IJ SOLN
INTRAMUSCULAR | Status: DC | PRN
  Administered 2018-07-26 (×2): 50 ug via INTRAVENOUS

## 2018-07-26 MED ORDER — GLYCOPYRROLATE 0.2 MG/ML IJ SOLN
INTRAMUSCULAR | Status: DC | PRN
  Administered 2018-07-26: 0.1 mg via INTRAVENOUS

## 2018-07-26 MED ORDER — ROCURONIUM BROMIDE 10 MG/ML (PF) SYRINGE
PREFILLED_SYRINGE | INTRAVENOUS | Status: DC | PRN
  Administered 2018-07-26: 50 mg via INTRAVENOUS

## 2018-07-26 MED ORDER — LIDOCAINE 2% (20 MG/ML) 5 ML SYRINGE
INTRAMUSCULAR | Status: DC | PRN
  Administered 2018-07-26: 40 mg via INTRAVENOUS

## 2018-07-26 MED ORDER — PROPOFOL 10 MG/ML IV BOLUS
INTRAVENOUS | Status: DC | PRN
  Administered 2018-07-26: 150 mg via INTRAVENOUS

## 2018-07-26 MED ORDER — MIDAZOLAM HCL 5 MG/5ML IJ SOLN
INTRAMUSCULAR | Status: DC | PRN
  Administered 2018-07-26: 2 mg via INTRAVENOUS

## 2018-07-26 MED ORDER — MIDAZOLAM HCL 2 MG/2ML IJ SOLN
INTRAMUSCULAR | Status: AC
  Filled 2018-07-26: qty 2

## 2018-07-26 MED ORDER — ONDANSETRON HCL 4 MG/2ML IJ SOLN
INTRAMUSCULAR | Status: DC | PRN
  Administered 2018-07-26: 4 mg via INTRAVENOUS

## 2018-07-26 MED ORDER — ROCURONIUM BROMIDE 50 MG/5ML IV SOSY
PREFILLED_SYRINGE | INTRAVENOUS | Status: AC
  Filled 2018-07-26: qty 5

## 2018-07-26 MED ORDER — 0.9 % SODIUM CHLORIDE (POUR BTL) OPTIME
TOPICAL | Status: DC | PRN
  Administered 2018-07-26: 1000 mL

## 2018-07-26 SURGICAL SUPPLY — 54 items
ALLOGRAFT SKIN MESHD 125 SQ CM (Tissue) ×3 IMPLANT
BNDG COHESIVE 4X5 TAN STRL (GAUZE/BANDAGES/DRESSINGS) ×5 IMPLANT
BNDG GAUZE ELAST 4 BULKY (GAUZE/BANDAGES/DRESSINGS) ×5 IMPLANT
BNDG GAUZE STRTCH 6 (GAUZE/BANDAGES/DRESSINGS) IMPLANT
BRUSH SCRUB SURG 4.25 DISP (MISCELLANEOUS) ×10 IMPLANT
CANISTER WOUND CARE 500ML ATS (WOUND CARE) ×5 IMPLANT
CASSETTE VERAFLO VERALINK (MISCELLANEOUS) ×5 IMPLANT
COVER SURGICAL LIGHT HANDLE (MISCELLANEOUS) ×5 IMPLANT
DRAPE INCISE IOBAN 66X45 STRL (DRAPES) ×5 IMPLANT
DRAPE U-SHAPE 47X51 STRL (DRAPES) ×5 IMPLANT
DRESSING PEEL AND PLC PRVNA 13 (GAUZE/BANDAGES/DRESSINGS) ×3 IMPLANT
DRESSING VERAFLO CLEANSE CC (GAUZE/BANDAGES/DRESSINGS) ×3 IMPLANT
DRSG ADAPTIC 3X8 NADH LF (GAUZE/BANDAGES/DRESSINGS) IMPLANT
DRSG MEPITEL 4X7.2 (GAUZE/BANDAGES/DRESSINGS) ×10 IMPLANT
DRSG PEEL AND PLACE PREVENA 13 (GAUZE/BANDAGES/DRESSINGS) ×5
DRSG VAC ATS SM SENSATRAC (GAUZE/BANDAGES/DRESSINGS) ×5 IMPLANT
DRSG VERAFLO CLEANSE CC (GAUZE/BANDAGES/DRESSINGS) ×5
ELECT REM PT RETURN 9FT ADLT (ELECTROSURGICAL) ×5
ELECTRODE REM PT RTRN 9FT ADLT (ELECTROSURGICAL) ×3 IMPLANT
GAUZE SPONGE 4X4 12PLY STRL (GAUZE/BANDAGES/DRESSINGS) ×5 IMPLANT
GLOVE BIO SURGEON STRL SZ7.5 (GLOVE) ×5 IMPLANT
GLOVE BIO SURGEON STRL SZ8 (GLOVE) ×5 IMPLANT
GLOVE BIOGEL PI IND STRL 7.5 (GLOVE) ×3 IMPLANT
GLOVE BIOGEL PI IND STRL 8 (GLOVE) ×3 IMPLANT
GLOVE BIOGEL PI INDICATOR 7.5 (GLOVE) ×2
GLOVE BIOGEL PI INDICATOR 8 (GLOVE) ×2
GOWN STRL REUS W/ TWL LRG LVL3 (GOWN DISPOSABLE) ×3 IMPLANT
GOWN STRL REUS W/ TWL XL LVL3 (GOWN DISPOSABLE) ×9 IMPLANT
GOWN STRL REUS W/TWL LRG LVL3 (GOWN DISPOSABLE) ×2
GOWN STRL REUS W/TWL XL LVL3 (GOWN DISPOSABLE) ×6
HANDPIECE INTERPULSE COAX TIP (DISPOSABLE)
KIT BASIN OR (CUSTOM PROCEDURE TRAY) ×5 IMPLANT
KIT DRSG PREVENA PLUS 7DAY 125 (MISCELLANEOUS) ×5 IMPLANT
KIT TURNOVER KIT B (KITS) ×5 IMPLANT
MANIFOLD NEPTUNE II (INSTRUMENTS) ×5 IMPLANT
NS IRRIG 1000ML POUR BTL (IV SOLUTION) ×5 IMPLANT
PACK ORTHO EXTREMITY (CUSTOM PROCEDURE TRAY) ×5 IMPLANT
PAD ARMBOARD 7.5X6 YLW CONV (MISCELLANEOUS) ×10 IMPLANT
PADDING CAST COTTON 6X4 STRL (CAST SUPPLIES) IMPLANT
SET HNDPC FAN SPRY TIP SCT (DISPOSABLE) IMPLANT
SKIN MESHED 125 SQ CM (Tissue) ×5 IMPLANT
SPONGE LAP 18X18 X RAY DECT (DISPOSABLE) ×5 IMPLANT
STOCKINETTE IMPERVIOUS 9X36 MD (GAUZE/BANDAGES/DRESSINGS) IMPLANT
SUT PDS AB 2-0 CT1 27 (SUTURE) IMPLANT
SUT PROLENE 4 0 RB 1 (SUTURE) ×2
SUT PROLENE 4-0 RB1 .5 CRCL 36 (SUTURE) ×3 IMPLANT
SWAB CULTURE ESWAB REG 1ML (MISCELLANEOUS) IMPLANT
TOWEL OR 17X24 6PK STRL BLUE (TOWEL DISPOSABLE) ×5 IMPLANT
TOWEL OR 17X26 10 PK STRL BLUE (TOWEL DISPOSABLE) ×5 IMPLANT
TUBE CONNECTING 12'X1/4 (SUCTIONS) ×1
TUBE CONNECTING 12X1/4 (SUCTIONS) ×4 IMPLANT
UNDERPAD 30X30 (UNDERPADS AND DIAPERS) ×5 IMPLANT
WATER STERILE IRR 1000ML POUR (IV SOLUTION) IMPLANT
YANKAUER SUCT BULB TIP NO VENT (SUCTIONS) ×5 IMPLANT
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
## 2018-07-26 NOTE — Brief Op Note (Signed)
07/26/2018  9:51 AM  PATIENT:  Sean Horton  32 y.o. male  PRE-OPERATIVE DIAGNOSIS:  osteomyelitis L hip, chronic wound left hip and left ankle  POST-OPERATIVE DIAGNOSIS:  osteomyelitis L hip, chronic wound left hip and left ankle  PROCEDURE:  Procedure(s): 1. IRRIGATION AND EXCISIONAL DEBRIDEMENT OF MUSCLE LEFT HIP (Left) 2. INSTILLATION WOUND VAC DRESSING CHANGE UNDER ANESTHESIA LEFT HIP 3. SPLIT THICKNESS SKIN GRAFTING LEFT ANKLE 6 CM X 7 CM  4. WOUND VAC DRESSING CHANGE UNDER ANESTHESIA LEFT ANKLE 5. DRESSING CHANGE UNDER ANESTHESIA RIGHT ANKLE  SURGEON:  Surgeon(s) and Role:    * Altamese Somers, MD - Primary  PHYSICIAN ASSISTANT: Ainsley Spinner, PA-C  ANESTHESIA:   general  EBL:  MINIMAL  BLOOD ADMINISTERED:none  DRAINS: WOUND VAC X 2   LOCAL MEDICATIONS USED:  NONE  SPECIMEN:  No Specimen  DISPOSITION OF SPECIMEN:  N/A  COUNTS:  YES  TOURNIQUET:  * No tourniquets in log *  DICTATION: .Other Dictation: Dictation Number [id number]  PLAN OF CARE: Admit to inpatient   PATIENT DISPOSITION:  PACU - hemodynamically stable.   Delay start of Pharmacological VTE agent (>24hrs) due to surgical blood loss or risk of bleeding: no

## 2018-07-26 NOTE — Progress Notes (Signed)
OT Cancellation    07/26/18 0800  OT Visit Information  Last OT Received On 07/26/18  Reason Eval/Treat Not Completed Patient at procedure or test/ unavailable (in surgery)  Maurie Boettcher, OT/L  OT Clinical Specialist [phone]

## 2018-07-26 NOTE — Progress Notes (Addendum)
Took Pt to OR for surgery, gave report to OR nurse, check pt last PCA dose with was 2.7. Report to oncoming nurse on unit. During report OR nurse told me to bring pt down with PCA pump and tele monitor. On arrival to OR, nurse informed me to take PCA pump and tele back to my unit that she didn't need it. I Returned to unit with PCA pump and tele monitor. Check dose with oncoming nurse Lowella Dandy and gave PCA key to nurse.

## 2018-07-26 NOTE — Anesthesia Preprocedure Evaluation (Signed)
Anesthesia Evaluation  Patient identified by MRN, date of birth, ID band Patient awake    Reviewed: Allergy & Precautions, NPO status , Patient's Chart, lab work & pertinent test results  Airway Mallampati: II  TM Distance: >3 FB Neck ROM: Full    Dental  (+) Teeth Intact   Pulmonary Current Smoker,    breath sounds clear to auscultation       Cardiovascular  Rhythm:Regular Rate:Normal     Neuro/Psych    GI/Hepatic   Endo/Other    Renal/GU      Musculoskeletal   Abdominal   Peds  Hematology   Anesthesia Other Findings   Reproductive/Obstetrics                             Anesthesia Physical Anesthesia Plan  ASA: III  Anesthesia Plan: General   Post-op Pain Management:    Induction: Intravenous  PONV Risk Score and Plan: Ondansetron and Dexamethasone  Airway Management Planned: Oral ETT  Additional Equipment:   Intra-op Plan:   Post-operative Plan: Extubation in OR  Informed Consent: I have reviewed the patients History and Physical, chart, labs and discussed the procedure including the risks, benefits and alternatives for the proposed anesthesia with the patient or authorized representative who has indicated his/her understanding and acceptance.   Dental advisory given  Plan Discussed with: CRNA and Anesthesiologist  Anesthesia Plan Comments:         Anesthesia Quick Evaluation
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
## 2018-07-26 NOTE — Transfer of Care (Signed)
Immediate Anesthesia Transfer of Care Note  Patient: Sean Horton  Procedure(s) Performed: IRRIGATION AND DEBRIDEMENT EXTREMITY (Left Hip) APPLICATION OF WOUND VAC, left hip and left ankle (Left Hip) IRRIGATION AND DEBRIDEMENT, left ankle  (Left Ankle) SKIN GRAFT FULL THICKNESS (Left Ankle) DRESSING CHANGE UNDER ANESTHESIA (Right Ankle)  Patient Location: PACU  Anesthesia Type:General  Level of Consciousness: awake, alert , oriented and patient cooperative  Airway & Oxygen Therapy: Patient Spontanous Breathing and Patient connected to nasal cannula oxygen  Post-op Assessment: Report given to RN and Post -op Vital signs reviewed and stable  Post vital signs: Reviewed and stable  Last Vitals:  Vitals Value Taken Time  BP 145/96 07/26/2018 10:03 AM  Temp    Pulse    Resp 16 07/26/2018 10:07 AM  SpO2    Vitals shown include unvalidated device data.  Last Pain:  Vitals:   07/26/18 0459  TempSrc: Oral  PainSc:       Patients Stated Pain Goal: 4 (45/85/92 9244)  Complications: No apparent anesthesia complications

## 2018-07-26 NOTE — Progress Notes (Addendum)
[facility] for Infectious Disease  Date of Admission:  07/16/2018  Total days of antibiotics 10        Day 1 ertapenem    Vancomycin, cefepime and flagyl discontinued  ASSESSMENT: Sean Horton was started on Ertapenem after cultures showed MSSA in the setting of complex wound infection with underlying pelvic osteomyelitis. Patient has a repeat wound exploration with I&D and wound vac change this morning with Orthopedic to reassess surgical flap given chronic wound and extensive infection on admission.  PLAN: 1. Will continue patient on ertapenem for a total of 6 weeks (Start date 8/20) END date 08/30/2018. 2. Recheck CRP and ESR at the end of treatment course. 3. Will follow up with Family Practice s/p discharge from hospital and at completion of treatment course. Can reach out to ID as needed. 4. Wound care and home health. 5. Follow up on ortho recommendation for wound vac for hip and left ankle lesion.  Principal Problem:   Osteomyelitis ([place]) Active Problems:   Paraplegia following spinal cord injury ([place])   Neurogenic bladder   Recurrent UTI   Wound infection   Cellulitis of hip, left   Sacral decubitus ulcer   Osteomyelitis of pelvic region [facility])  Diagnosis: Osteomyelitis   Culture Result: Staph Aureus   No Known Allergies  OPAT Orders Discharge antibiotics: Ertapenem  Per pharmacy protocol  Duration: 6 weeks End Date: 08/30/2018  [facility] Care Per Protocol:  Labs weekly while on IV antibiotics: _x_ CBC with differential _x_ BMP __ CMP _x_ CRP _x_ ESR   __ Please pull PIC at completion of IV antibiotics _x_ Please leave PIC in place until doctor has seen patient or been notified  Fax weekly labs to [phone]  Clinic Follow Up Appt: around 08/30/2018 @ [place] ( ID will be available for questions if needed)      Scheduled Meds: . baclofen  40 mg Oral BID  . Chlorhexidine Gluconate Cloth  6 each Topical Daily  . docusate  sodium  100 mg Oral BID  . DULoxetine  60 mg Oral BID  . feeding supplement (ENSURE ENLIVE)  237 mL Oral TID BM  . Glycerin (Adult)  1 suppository Rectal Once  . HYDROmorphone   Intravenous Q4H  . multivitamin with minerals  1 tablet Oral Daily  . mupirocin ointment  1 application Nasal BID  . oxybutynin  5 mg Oral TID  . pantoprazole  40 mg Oral Daily  . polyethylene glycol  17 g Oral BID  . pregabalin  300 mg Oral BID  . tiZANidine  2 mg Oral Daily  . zinc sulfate  220 mg Oral Daily   Continuous Infusions: . sodium chloride 125 mL/hr at 07/26/18 0157  . ertapenem Stopped (07/26/18 1130)  . lactated ringers 10 mL/hr at 07/26/18 0731   PRN Meds:.barrier cream, diphenhydrAMINE **OR** diphenhydrAMINE, ibuprofen, naloxone **AND** sodium chloride flush, ondansetron (ZOFRAN) IV, promethazine, senna-docusate, sodium chloride flush   SUBJECTIVE: Sean Horton is doing well this afternoon after surgery. Pain is fairly well controlled. He denies any nausea or vomiting.  Appetite is fair, and patient is ready for discharge. He understands treatment course and need for follow up.   Review of Systems: Review of Systems  Constitutional: Negative for chills and fever.  Respiratory: Negative for cough.   Cardiovascular: Negative for chest pain and palpitations.  Gastrointestinal: Negative for nausea and vomiting.  Musculoskeletal: Positive for joint pain.   No Known Allergies  OBJECTIVE: Vitals:   07/26/18 1048 07/26/18 1157 07/26/18 1200 07/26/18 1314  BP: 103/68 (!) 130/92  128/82  Pulse:  (!) 58  71  Resp: _0 Temp: (!) 97.3 F (36.3 C) 97.8 F (36.6 C)  98 F (36.7 C)  TempSrc:  Oral  Oral  SpO2: 95% 98% 96% 98%  Weight:      Height:       Body mass index is 23.71 kg/m.  Physical Exam General: NAD, pleasant, able to participate in exam Cardiac: RRR, normal heart sounds, no murmurs. 2+ radial and PT pulses bilaterally Respiratory: CTAB, normal effort, No wheezes,  rales or rhonchi Abdomen: soft, nontender, nondistended, no hepatic or splenomegaly, +BS Extremities:wound vac in place left hip and left lateral malleolus Skin:warm and dry, left axillary lesion, no drainage, superficial with surrounding granulation tissue. Neuro: alert and oriented x4,paraplegic, UE normal strength and sensation intact Psych:Normal affect and mood   Lab Results Lab Results  Component Value Date   WBC 14.8 (H) 07/26/2018   HGB 9.4 (L) 07/26/2018   HCT 32.0 (L) 07/26/2018   MCV 89.1 07/26/2018   PLT 528 (H) 07/26/2018    Lab Results  Component Value Date   CREATININE 0.83 07/26/2018   BUN 18 07/26/2018   NA 138 07/26/2018   K 4.8 07/26/2018   CL 105 07/26/2018   CO2 27 07/26/2018    Lab Results  Component Value Date   ALT 17 07/17/2018   AST 46 (H) 07/17/2018   ALKPHOS 146 (H) 07/17/2018   BILITOT 0.5 07/17/2018     Microbiology: Recent Results (from the past 240 hour(s))  Blood Culture (routine x 2)     Status: None   Collection Time: 07/16/18  5:10 PM  Result Value Ref Range Status   Specimen Description BLOOD RIGHT ANTECUBITAL  Final   Special Requests   Final    BOTTLES DRAWN AEROBIC AND ANAEROBIC Blood Culture adequate volume   Culture   Final    NO GROWTH 5 DAYS Performed at [facility], [address], [place], [place] [address]    Report Status 07/21/2018 FINAL  Final  Blood Culture (routine x 2)     Status: None   Collection Time: 07/16/18  5:19 PM  Result Value Ref Range Status   Specimen Description BLOOD RIGHT WRIST IV  Final   Special Requests   Final    BOTTLES DRAWN AEROBIC AND ANAEROBIC Blood Culture adequate volume   Culture   Final    NO GROWTH 5 DAYS Performed at [facility], [place] [address] [place], [place] [address]    Report Status 07/21/2018 FINAL  Final  MRSA PCR Screening     Status: None   Collection Time: 07/17/18 12:33 AM  Result Value Ref Range Status   MRSA by PCR NEGATIVE NEGATIVE Final     Comment:        The GeneXpert MRSA Assay (FDA approved for NASAL specimens only), is one component of a comprehensive MRSA colonization surveillance program. It is not intended to diagnose MRSA infection nor to guide or monitor treatment for MRSA infections. Performed at [facility], [place] [address] [place], [place] [address]   Aerobic/Anaerobic Culture (surgical/deep wound)     Status: None   Collection Time: 07/19/18  2:45 PM  Result Value Ref Range Status   Specimen Description HIP LEFT PROX [facility]  Final   Special Requests NONE  Final   Gram  Stain   Final    RARE WBC PRESENT,BOTH PMN AND MONONUCLEAR NO ORGANISMS SEEN    Culture   Final    RARE STAPHYLOCOCCUS AUREUS NO ANAEROBES ISOLATED Performed at [facility], [address] [place], [place] [address]    Report Status 07/24/2018 FINAL  Final   Organism ID, Bacteria STAPHYLOCOCCUS AUREUS  Final      Susceptibility   Staphylococcus aureus - MIC*    CIPROFLOXACIN <=0.5 SENSITIVE Sensitive     ERYTHROMYCIN <=0.25 SENSITIVE Sensitive     GENTAMICIN <=0.5 SENSITIVE Sensitive     OXACILLIN <=0.25 SENSITIVE Sensitive     TETRACYCLINE <=1 SENSITIVE Sensitive     VANCOMYCIN 1 SENSITIVE Sensitive     TRIMETH/SULFA <=10 SENSITIVE Sensitive     CLINDAMYCIN <=0.25 SENSITIVE Sensitive     RIFAMPIN <=0.5 SENSITIVE Sensitive     Inducible Clindamycin NEGATIVE Sensitive     * RARE STAPHYLOCOCCUS AUREUS  Surgical pcr screen     Status: Abnormal   Collection Time: 07/22/18 12:58 AM  Result Value Ref Range Status   MRSA, PCR NEGATIVE NEGATIVE Final   Staphylococcus aureus POSITIVE (A) NEGATIVE Final    Comment: (NOTE) The Xpert SA Assay (FDA approved for NASAL specimens in patients 22 years of age and older), is one component of a comprehensive surveillance program. It is not intended to diagnose infection nor to guide or monitor treatment.      , MD [facility], PGY-3  07/26/2018,  2:03 PM 

## 2018-07-26 NOTE — Anesthesia Procedure Notes (Signed)
Procedure Name: Intubation Date/Time: 07/26/2018 8:44 AM Performed by: Cleda Daub, CRNA Pre-anesthesia Checklist: Patient identified, Emergency Drugs available, Suction available and Patient being monitored Patient Re-evaluated:Patient Re-evaluated prior to induction Oxygen Delivery Method: Circle system utilized Preoxygenation: Pre-oxygenation with 100% oxygen Induction Type: IV induction Ventilation: Mask ventilation without difficulty and Mask ventilation throughout procedure Laryngoscope Size: Mac Grade View: Grade I Tube type: Oral Tube size: 8.0 mm Number of attempts: 1 Airway Equipment and Method: Stylet Placement Confirmation: ETT inserted through vocal cords under direct vision,  positive ETCO2 and breath sounds checked- equal and bilateral Secured at: 24 cm Tube secured with: Tape Dental Injury: Teeth and Oropharynx as per pre-operative assessment

## 2018-07-26 NOTE — Progress Notes (Addendum)
Opened in error

## 2018-07-27 LAB — BASIC METABOLIC PANEL
Anion gap: 4 — ABNORMAL LOW (ref 5–15)
BUN: 17 mg/dL (ref 6–20)
CHLORIDE: 111 mmol/L (ref 98–111)
CO2: 26 mmol/L (ref 22–32)
CREATININE: 0.71 mg/dL (ref 0.61–1.24)
Calcium: 7.6 mg/dL — ABNORMAL LOW (ref 8.9–10.3)
GFR calc Af Amer: >60 mL/min (ref >60)
GFR calc non Af Amer: >60 mL/min (ref >60)
Glucose, Bld: 88 mg/dL (ref 70–99)
POTASSIUM: 4.5 mmol/L (ref 3.5–5.1)
Sodium: 141 mmol/L (ref 135–145)

## 2018-07-27 LAB — CBC
HCT: 26.8 % — ABNORMAL LOW (ref 39.0–52.0)
HCT: 31.8 % — ABNORMAL LOW (ref 39.0–52.0)
HEMOGLOBIN: 9.2 g/dL — AB (ref 13.0–17.0)
Hemoglobin: 7.7 g/dL — ABNORMAL LOW (ref 13.0–17.0)
MCH: 26.4 pg (ref 26.0–34.0)
MCH: 26.3 pg (ref 26.0–34.0)
MCHC: 28.7 g/dL — AB (ref 30.0–36.0)
MCHC: 28.9 g/dL — ABNORMAL LOW (ref 30.0–36.0)
MCV: 91.8 fL (ref 78.0–100.0)
MCV: 90.9 fL (ref 78.0–100.0)
PLATELETS: 443 10*3/uL — AB (ref 150–400)
PLATELETS: 531 10*3/uL — AB (ref 150–400)
RBC: 2.92 MIL/uL — ABNORMAL LOW (ref 4.22–5.81)
RBC: 3.5 MIL/uL — ABNORMAL LOW (ref 4.22–5.81)
RDW: 24.5 % — AB (ref 11.5–15.5)
RDW: 24.2 % — ABNORMAL HIGH (ref 11.5–15.5)
WBC: 11.7 10*3/uL — ABNORMAL HIGH (ref 4.0–10.5)
WBC: 13.6 10*3/uL — AB (ref 4.0–10.5)

## 2018-07-27 MED ORDER — ACETAMINOPHEN 325 MG PO TABS
650.0000 mg | ORAL_TABLET | Freq: Four times a day (QID) | ORAL | Status: DC
  Administered 2018-07-27 – 2018-08-01 (×17): 650 mg via ORAL
  Filled 2018-07-27 (×17): qty 2

## 2018-07-27 MED ORDER — SENNOSIDES-DOCUSATE SODIUM 8.6-50 MG PO TABS
1.0000 | ORAL_TABLET | Freq: Two times a day (BID) | ORAL | Status: DC
  Administered 2018-07-27 – 2018-08-01 (×11): 1 via ORAL
  Filled 2018-07-27 (×11): qty 1

## 2018-07-27 NOTE — Progress Notes (Signed)
OT Cancellation Note  Patient Details Name: Sean Horton MRN: [id number] DOB: [birth year]   Cancelled Treatment:    Reason Eval/Treat Not Completed: Initiated OT eval, however, after interruptions from multiple providers, and pt becoming upset with MD interaction, he requested OT leave him alone at this time.  Will reattempt.  Corran Lalone [place], OTR/L [phone]  Lucille Passy M 07/27/2018, 7:00 PM

## 2018-07-27 NOTE — Plan of Care (Signed)
Pt states his pain is 6 on a scale of 0-10. Pt in bed with eyes closed. Pt using a pca for pain management. Frequently falls asleep with intermittent complaints of uncontrolled pain.
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
## 2018-07-28 LAB — CBC
HEMATOCRIT: 34.3 % — AB (ref 39.0–52.0)
HEMOGLOBIN: 10.1 g/dL — AB (ref 13.0–17.0)
MCH: 26.5 pg (ref 26.0–34.0)
MCHC: 29.4 g/dL — ABNORMAL LOW (ref 30.0–36.0)
MCV: 90 fL (ref 78.0–100.0)
Platelets: 533 10*3/uL — ABNORMAL HIGH (ref 150–400)
RBC: 3.81 MIL/uL — AB (ref 4.22–5.81)
RDW: 23.7 % — ABNORMAL HIGH (ref 11.5–15.5)
WBC: 13.9 10*3/uL — ABNORMAL HIGH (ref 4.0–10.5)

## 2018-07-28 LAB — BASIC METABOLIC PANEL
Anion gap: 6 (ref 5–15)
BUN: 16 mg/dL (ref 6–20)
CHLORIDE: 104 mmol/L (ref 98–111)
CO2: 27 mmol/L (ref 22–32)
Calcium: 8.9 mg/dL (ref 8.9–10.3)
Creatinine, Ser: 0.9 mg/dL (ref 0.61–1.24)
GFR calc Af Amer: >60 mL/min (ref >60)
GFR calc non Af Amer: >60 mL/min (ref >60)
GLUCOSE: 184 mg/dL — AB (ref 70–99)
POTASSIUM: 5.1 mmol/L (ref 3.5–5.1)
Sodium: 137 mmol/L (ref 135–145)

## 2018-07-28 MED ORDER — KETOROLAC TROMETHAMINE 15 MG/ML IJ SOLN
15.0000 mg | Freq: Once | INTRAMUSCULAR | Status: AC
  Administered 2018-07-28: 15 mg via INTRAVENOUS
  Filled 2018-07-28: qty 1

## 2018-07-28 NOTE — Progress Notes (Addendum)
Two mLs of IV PCA Dilaudid wasted during syringe change with Lonzo Candy, RN.
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
## 2018-07-28 NOTE — Evaluation (Signed)
Occupational Therapy Evaluation Patient Details Name: Sean Horton MRN: [id number] DOB: January 28, 1986 Today's Date: 07/28/2018    History of Present Illness Pt is a 32 y.o. male with PMH significant for paraplegia from T3 SCI, neurogenic bladder, and chronic ulcers, admitted 07/16/18 with purulent drainage from L hip ulcer. Hip MRI showed osteomyelitis of L acetabulum, greater trochanter, distal sacrum, and coccyx. S/p L hip and ankle I&D 8/28; plan for repeat I&D 8/30.    Clinical Impression   Patient evaluated by Occupational Therapy with no further acute OT needs identified. All education has been completed and the patient has no further questions. Pt feels he is at baseline and doesn't need further OT.  He was provided with a reacher and would benefit from a new tub transfer bench as the back has broken from his current one, and he has almost sustained a fall using it.   Discussed need to preform pressure relief every 15 mins, and ways to more effectively incorporate pressure relief into daily activity. See below for any follow-up Occupational Therapy or equipment needs. OT is signing off. Thank you for this referral.      Follow Up Recommendations  No OT follow up    Equipment Recommendations  Tub/shower bench    Recommendations for Other Services       Precautions / Restrictions Precautions Precautions: Fall Precaution Comments: h/o T3 SCI paraplegia; wound vac      Mobility Bed Mobility               General bed mobility comments: Pt independent with repositioning in bed.  He declined further activity   Transfers                      Balance                                           ADL either performed or assessed with clinical judgement   ADL Overall ADL's : At baseline                                       General ADL Comments: Not formally assessed.  Pt states he is able to perform ADLs and doesn't feel he needs to  practice ADLs.   He does report his tub transfer bench is unsteady and the back has broken and he would benefit from a new one.       Vision         Perception     Praxis      Pertinent Vitals/Pain Pain Assessment: Faces Faces Pain Scale: Hurts little more Pain Location: L hip Pain Descriptors / Indicators: Sore;Constant Pain Intervention(s): Limited activity within patient's tolerance;PCA encouraged     Hand Dominance Right   Extremity/Trunk Assessment Upper Extremity Assessment Upper Extremity Assessment: Overall WFL for tasks assessed   Lower Extremity Assessment Lower Extremity Assessment: (paraplegic )       Communication Communication Communication: No difficulties   Cognition Arousal/Alertness: Awake/alert Behavior During Therapy: Flat affect Overall Cognitive Status: Within Functional Limits for tasks assessed                                     General  Comments  Pt has Roho cushion and reports he knows how to pump the air into it to maintain appropriate pressure distribution.  Discussed pressure relief with him and need to perform pressure relief every 15 mins.  He reports he currently performs every 1-2 hours.  Instructed him how to he could incorporate pressure relief more freqently.   Pt provided with SCI resources - [employer].  Discussed SCI support groups locally or online, but he states he isn't a support group type of guy, and isn't interested.  He reports he is very limited in his activities at home due to his injury and pain.  Pt provided with a reacher.   He was very appreciative of discussion and info     Exercises     Shoulder Instructions      Home Living Family/patient expects to be discharged to:: Private residence Living Arrangements: Alone Available Help at Discharge: Family;Available PRN/intermittently Type of Home: Apartment Home Access: Level entry     Home Layout: One level     Bathroom Shower/Tub:  Teacher, early years/pre: Standard     Home Equipment: Wheelchair - manual;Tub bench   Additional Comments: Mother can assist PRN      Prior Functioning/Environment Level of Independence: Independent with assistive device(s)        Comments: Pt does not drive.  He reports he doesn't go out much do to chronic pain.  He is Mod indep with manual w/c, transfers, ADLs. Mother assists with errands and transportation        OT Problem List: Decreased knowledge of precautions;Impaired sensation      OT Treatment/Interventions:      OT Goals(Current goals can be found in the care plan section) Acute Rehab OT Goals Patient Stated Goal: To have less pain  OT Goal Formulation: All assessment and education complete, DC therapy  OT Frequency:     Barriers to D/C:            Co-evaluation              AM-PAC PT "6 Clicks" Daily Activity     Outcome Measure Help from another person eating meals?: None Help from another person taking care of personal grooming?: None Help from another person toileting, which includes using toliet, bedpan, or urinal?: None Help from another person bathing (including washing, rinsing, drying)?: None Help from another person to put on and taking off regular upper body clothing?: None Help from another person to put on and taking off regular lower body clothing?: None 6 Click Score: 24   End of Session    Activity Tolerance: Patient limited by pain Patient left: in bed;with call bell/phone within reach  OT Visit Diagnosis: Muscle weakness (generalized) (M62.81)                Time: 8003-4917 OT Time Calculation (min): 28 min Charges:  OT General Charges $OT Visit: 1 Visit OT Evaluation $OT Eval Low Complexity: 1 Low OT Treatments $Self Care/Home Management : 8-22 mins  Lucille Passy, OTR/L Acute Rehabilitation Services Pager [phone] Office [phone]   Lucille Passy M 07/28/2018, 5:21 PM

## 2018-07-28 NOTE — Progress Notes (Signed)
Wasted 32mLs of dilaudid via tubing with Manual Meier, RN during PICC line care requiring tubing change.

## 2018-07-29 LAB — CBC WITH DIFFERENTIAL/PLATELET
BASOS PCT: 2 %
Basophils Absolute: 0.3 10*3/uL — ABNORMAL HIGH (ref 0.0–0.1)
EOS ABS: 0.9 10*3/uL — AB (ref 0.0–0.7)
EOS PCT: 7 %
HCT: 34.7 % — ABNORMAL LOW (ref 39.0–52.0)
Hemoglobin: 10.2 g/dL — ABNORMAL LOW (ref 13.0–17.0)
Lymphocytes Relative: 39 %
Lymphs Abs: 4.9 10*3/uL — ABNORMAL HIGH (ref 0.7–4.0)
MCH: 26.5 pg (ref 26.0–34.0)
MCHC: 29.4 g/dL — ABNORMAL LOW (ref 30.0–36.0)
MCV: 90.1 fL (ref 78.0–100.0)
Monocytes Absolute: 1 10*3/uL (ref 0.1–1.0)
Monocytes Relative: 8 %
NEUTROS PCT: 44 %
Neutro Abs: 5.4 10*3/uL (ref 1.7–7.7)
PLATELETS: 503 10*3/uL — AB (ref 150–400)
RBC: 3.85 MIL/uL — AB (ref 4.22–5.81)
RDW: 22.8 % — ABNORMAL HIGH (ref 11.5–15.5)
WBC: 12.5 10*3/uL — ABNORMAL HIGH (ref 4.0–10.5)

## 2018-07-29 LAB — BASIC METABOLIC PANEL
ANION GAP: 11 (ref 5–15)
BUN: 22 mg/dL — ABNORMAL HIGH (ref 6–20)
CALCIUM: 9 mg/dL (ref 8.9–10.3)
CHLORIDE: 103 mmol/L (ref 98–111)
CO2: 25 mmol/L (ref 22–32)
Creatinine, Ser: 0.96 mg/dL (ref 0.61–1.24)
GFR calc non Af Amer: >60 mL/min (ref >60)
Glucose, Bld: 117 mg/dL — ABNORMAL HIGH (ref 70–99)
Potassium: 5.2 mmol/L — ABNORMAL HIGH (ref 3.5–5.1)
Sodium: 139 mmol/L (ref 135–145)

## 2018-07-29 MED ORDER — OXYCODONE-ACETAMINOPHEN 5-325 MG PO TABS
2.0000 | ORAL_TABLET | Freq: Three times a day (TID) | ORAL | Status: DC | PRN
  Administered 2018-07-29 – 2018-08-01 (×6): 2 via ORAL
  Filled 2018-07-29 (×6): qty 2

## 2018-07-29 MED ORDER — OXYCODONE HCL ER 15 MG PO T12A
50.0000 mg | EXTENDED_RELEASE_TABLET | Freq: Two times a day (BID) | ORAL | Status: DC
  Administered 2018-07-29 – 2018-07-31 (×5): 50 mg via ORAL
  Filled 2018-07-29 (×6): qty 2

## 2018-07-29 NOTE — Progress Notes (Signed)
62mL of dilaudid PCA was wasted via sharps with Romualdo Bolk, RN after syringe replacement.

## 2018-07-29 NOTE — Progress Notes (Signed)
Orthopaedic Trauma Service  Plan for [facility] change at bedside tomorrow Will dc veraflo and convert to conventional VAC Pt could dc home after vac change provided that he has his home vac unit and [facility], [facility], [facility] has been arranged He will need another VAC change at home on 08/02/2018 and then follow up appointment with Dr. Sharol Given on 08/05/2018  Jari Pigg, PA-C [facility] [phone] (C) [phone] (O) 07/29/2018 9:00 AM
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
## 2018-07-29 NOTE — Progress Notes (Signed)
Patient may d/c home with [facility] services. If this is the case he will need one of forms on the front of his chart completed for the wound vac. CM following.

## 2018-07-30 ENCOUNTER — Encounter (HOSPITAL_COMMUNITY): Payer: Self-pay | Admitting: Orthopedic Surgery

## 2018-07-30 LAB — BASIC METABOLIC PANEL
Anion gap: 9 (ref 5–15)
BUN: 18 mg/dL (ref 6–20)
CHLORIDE: 103 mmol/L (ref 98–111)
CO2: 26 mmol/L (ref 22–32)
Calcium: 9.1 mg/dL (ref 8.9–10.3)
Creatinine, Ser: 1.05 mg/dL (ref 0.61–1.24)
GFR calc non Af Amer: >60 mL/min (ref >60)
Glucose, Bld: 102 mg/dL — ABNORMAL HIGH (ref 70–99)
POTASSIUM: 5.4 mmol/L — AB (ref 3.5–5.1)
SODIUM: 138 mmol/L (ref 135–145)

## 2018-07-30 LAB — CBC WITH DIFFERENTIAL/PLATELET
BASOS ABS: 0.2 10*3/uL — AB (ref 0.0–0.1)
Basophils Relative: 2 %
EOS ABS: 0.8 10*3/uL — AB (ref 0.0–0.7)
Eosinophils Relative: 8 %
HCT: 31.1 % — ABNORMAL LOW (ref 39.0–52.0)
Hemoglobin: 9 g/dL — ABNORMAL LOW (ref 13.0–17.0)
Lymphocytes Relative: 27 %
Lymphs Abs: 2.9 10*3/uL (ref 0.7–4.0)
MCH: 26.3 pg (ref 26.0–34.0)
MCHC: 28.9 g/dL — ABNORMAL LOW (ref 30.0–36.0)
MCV: 90.9 fL (ref 78.0–100.0)
Monocytes Absolute: 0.8 10*3/uL (ref 0.1–1.0)
Monocytes Relative: 8 %
NEUTROS PCT: 55 %
Neutro Abs: 5.9 10*3/uL (ref 1.7–7.7)
Platelets: 443 10*3/uL — ABNORMAL HIGH (ref 150–400)
RBC: 3.42 MIL/uL — AB (ref 4.22–5.81)
RDW: 22.5 % — ABNORMAL HIGH (ref 11.5–15.5)
WBC: 10.6 10*3/uL — ABNORMAL HIGH (ref 4.0–10.5)

## 2018-07-30 LAB — C-REACTIVE PROTEIN: CRP: 3.6 mg/dL — AB (ref <1.0)

## 2018-07-30 LAB — SEDIMENTATION RATE: Sed Rate: 69 mm/hr — ABNORMAL HIGH (ref 0–16)

## 2018-07-30 MED ORDER — OXYCODONE-ACETAMINOPHEN 5-325 MG PO TABS
2.0000 | ORAL_TABLET | Freq: Once | ORAL | Status: AC
  Administered 2018-07-30: 2 via ORAL
  Filled 2018-07-30: qty 2

## 2018-07-30 MED ORDER — HYDROMORPHONE HCL 1 MG/ML IJ SOLN
1.0000 mg | Freq: Once | INTRAMUSCULAR | Status: AC
  Administered 2018-07-30: 1 mg via INTRAVENOUS
  Filled 2018-07-30: qty 1

## 2018-07-30 MED ORDER — KETOROLAC TROMETHAMINE 15 MG/ML IJ SOLN
15.0000 mg | Freq: Once | INTRAMUSCULAR | Status: AC | PRN
  Administered 2018-07-30: 15 mg via INTRAVENOUS
  Filled 2018-07-30: qty 1

## 2018-07-30 NOTE — Discharge Instructions (Addendum)
Orthopaedic Discharge instructions   Daily dressing changes to R ankle     Nonstick layer (mepitel or adaptic), 4x4 gauze and kerlix     Minimize pressure to R ankle as much as possible   VAC change to Left hip on 08/02/2018 by [facility]  VAC changes MWF unless directed otherwise by Dr. Sharol Given Follow up with Dr. Sharol Given on tuesday 08/06/2018, bring VAC supplies with you for a dressing change  VAC to be set at 125 mmHg, continuous suction   Dressing changes to Left ankle every other day starting on 08/01/2018 (you can wait for [facility] to do dressing change on 9/6)     Nonstick layer over skin graft (mepitel or adaptic), 4x4 gauze and kerlix      Minimize pressure to L ankle as much as possible    Family Medicine discharge instructions:  You were admitted to [facility] for infection of your L hip ulcer. You will continue to receive IV antibiotics until 10/4 while at home. You also had several debridements of your ulcer with wound vac placement, and now will have a wound vac for home with home wound care. See further instructions as above for dressing changes. Please make sure you follow up with Dr. Sharol Given on 9/10 at 1 pm and follow up with the family medicine clinic on Monday 9/2 at 1:50pm as well. Please seek medical care if you have a persistent fever >100.4, notice increased redness, swelling, or drainage around your ulcer sites.  We have prescribed you additional oxycodone for breakthrough pain and have provided Narcan (this is given during suspected opioid overdose) in case of emergencies. Instructions as below.

## 2018-07-30 NOTE — Care Management (Signed)
    Durable Medical Equipment  (From admission, onward)         Start     Ordered   07/30/18 0944  For home use only DME Hospital bed  Once    Question Answer Comment  Patient has (list medical condition): paraplegic, numerous pressure sores   The above medical condition requires: Patient requires the ability to reposition frequently   Bed type Semi-electric   Trapeze Bar Yes   Support Surface: Low Air loss Mattress      07/30/18 0944   07/30/18 0944  For home use only DME Air overlay mattress  Once     07/30/18 0944   07/29/18 0940  For home use only DME Tub bench  Once     07/29/18 0939   07/26/18 1014  For home use only DME Negative pressure wound device  Once    Question Answer Comment  Frequency of dressing change 3 times per week   Length of need 3 Months   Dressing type Foam   Amount of suction 100 mm/Hg   Pressure application Continuous pressure   Supplies 10 canisters and 15 dressings per month for duration of therapy      07/26/18 1013

## 2018-07-30 NOTE — Progress Notes (Addendum)
Orthopedic Trauma Service Progress Note   Patient ID: Sean Horton MRN: [id number] DOB/AGE: [birth year] 32 y.o.  Subjective:  Doing ok No acute changes Primary c/o nerve pain   Ready to go home  Would benefit from hospital bed with air mattress    Review of Systems  Constitutional: Negative for chills and fever.  Respiratory: Negative for shortness of breath and wheezing.   Cardiovascular: Negative for chest pain and palpitations.  Gastrointestinal: Negative for nausea and vomiting.    Objective:   VITALS:   Vitals:   07/29/18 1236 07/29/18 1423 07/29/18 2326 07/30/18 0624  BP:  (!) 94/58 (!) 96/56 (!) 95/56  Pulse:  74 68 63  Resp: 18 16 18 18   Temp:  (!) 97.4 F (36.3 C) 98.5 F (36.9 C) 98 F (36.7 C)  TempSrc:  Oral  Oral  SpO2: 98% 97% 99% 97%  Weight:      Height:        Estimated body mass index is 23.71 kg/m as calculated from the following:   Height as of this encounter: 6\' 5"  (1.956 m).   Weight as of this encounter: 90.7 kg.   Intake/Output      09/02 0701 - 09/03 0700 09/03 0701 - 09/04 0700   P.O. 720    I.V. (mL/kg) 1256 (13.8)    Total Intake(mL/kg) 1976 (21.8)    Urine (mL/kg/hr) 4200 (1.9)    Total Output 4200    Net -2224           LABS  Results for orders placed or performed during the hospital encounter of 07/16/18 (from the past 24 hour(s))  CBC with Differential/Platelet     Status: Abnormal   Collection Time: 07/30/18  4:29 AM  Result Value Ref Range   WBC 10.6 (H) 4.0 - 10.5 K/uL   RBC 3.42 (L) 4.22 - 5.81 MIL/uL   Hemoglobin 9.0 (L) 13.0 - 17.0 g/dL   HCT 31.1 (L) 39.0 - 52.0 %   MCV 90.9 78.0 - 100.0 fL   MCH 26.3 26.0 - 34.0 pg   MCHC 28.9 (L) 30.0 - 36.0 g/dL   RDW 22.5 (H) 11.5 - 15.5 %   Platelets 443 (H) 150 - 400 K/uL   Neutrophils Relative % 55 %   Lymphocytes Relative 27 %   Monocytes Relative 8 %   Eosinophils Relative 8 %   Basophils Relative 2 %   Neutro Abs 5.9 1.7 - 7.7 K/uL    Lymphs Abs 2.9 0.7 - 4.0 K/uL   Monocytes Absolute 0.8 0.1 - 1.0 K/uL   Eosinophils Absolute 0.8 (H) 0.0 - 0.7 K/uL   Basophils Absolute 0.2 (H) 0.0 - 0.1 K/uL   RBC Morphology STOMATOCYTES   Basic metabolic panel     Status: Abnormal   Collection Time: 07/30/18  4:29 AM  Result Value Ref Range   Sodium 138 135 - 145 mmol/L   Potassium 5.4 (H) 3.5 - 5.1 mmol/L   Chloride 103 98 - 111 mmol/L   CO2 26 22 - 32 mmol/L   Glucose, Bld 102 (H) 70 - 99 mg/dL   BUN 18 6 - 20 mg/dL   Creatinine, Ser 1.05 0.61 - 1.24 mg/dL   Calcium 9.1 8.9 - 10.3 mg/dL   GFR calc non Af Amer >60 >60 mL/min   GFR calc Af Amer >60 >60 mL/min   Anion gap 9 5 - 15     PHYSICAL EXAM:   Gen: resting comfortably in bed,  NAD, appears well  Ext:       Left Lower Extremity   Prevena removed from L lateral ankle   Skin graft taking well    Good, healthy granulation tissue under graft   No odor or signs of infection    Mepitel, 4x4's and kerlix applied   veraflo functioning to L hip    veraflo stopped and dressing removed   All grey sponges removed from wound   Majority of the tissue looks very healthy with good bed of granulation tissue   No purulence or odor noted   New black vac sponge applied    Medium sponge cut into a spiral shape to allow it to fit into dead space     mepitel applied to incision with sponge over it    Good seal achieved    Vac resumed in normal mode    Dressing change done with sterile gloves       Pt tolerated change well, no signs of discomfort   Assessment/Plan: 4 Days Post-Op   Principal Problem:   Osteomyelitis ([place]) Active Problems:   Paraplegia following spinal cord injury ([place])   Neurogenic bladder   Recurrent UTI   Wound infection   Cellulitis of hip, left   Sacral decubitus ulcer   Osteomyelitis of pelvic region ([place])   Anti-infectives (From admission, onward)   Start     Dose/Rate Route Frequency Ordered Stop   07/26/18 1100  ertapenem (INVANZ) 1,000 mg  in sodium chloride 0.9 % 100 mL IVPB     1 g 200 mL/hr over 30 Minutes Intravenous Every 24 hours 07/26/18 0900     07/24/18 1430  metroNIDAZOLE (FLAGYL) tablet 500 mg  Status:  Discontinued     500 mg Oral Every 8 hours 07/24/18 1418 07/26/18 0900   07/22/18 1130  ceFEPIme (MAXIPIME) 1 g in sodium chloride 0.9 % 100 mL IVPB  Status:  Discontinued     1 g 200 mL/hr over 30 Minutes Intravenous To Surgery 07/22/18 1126 07/22/18 1244   07/19/18 2000  vancomycin (VANCOCIN) IVPB 1000 mg/200 mL premix  Status:  Discontinued     1,000 mg 200 mL/hr over 60 Minutes Intravenous Every 12 hours 07/19/18 1854 07/19/18 1857   07/19/18 1930  vancomycin (VANCOCIN) IVPB 1000 mg/200 mL premix  Status:  Discontinued     1,000 mg 200 mL/hr over 60 Minutes Intravenous Every 12 hours 07/19/18 1857 07/24/18 1134   07/19/18 1400  ceFAZolin (ANCEF) IVPB 2g/100 mL premix  Status:  Discontinued     2 g 200 mL/hr over 30 Minutes Intravenous  Once 07/19/18 1355 07/20/18 1253   07/19/18 1218  ceFAZolin (ANCEF) 2-4 GM/100ML-% IVPB    Note to Pharmacy:  Luciana Axe   : cabinet override      07/19/18 1218 07/20/18 0029   07/17/18 0600  vancomycin (VANCOCIN) IVPB 1000 mg/200 mL premix  Status:  Discontinued     1,000 mg 200 mL/hr over 60 Minutes Intravenous Every 8 hours 07/16/18 1944 07/19/18 1854   07/17/18 0500  vancomycin (VANCOCIN) IVPB 1000 mg/200 mL premix  Status:  Discontinued     1,000 mg 200 mL/hr over 60 Minutes Intravenous Every 8 hours 07/16/18 1855 07/16/18 1944   07/17/18 0300  ceFEPIme (MAXIPIME) 1 g in sodium chloride 0.9 % 100 mL IVPB  Status:  Discontinued     1 g 200 mL/hr over 30 Minutes Intravenous Every 8 hours 07/16/18 1855 07/16/18 2347   07/17/18 0300  ceFEPIme (  MAXIPIME) 2 g in sodium chloride 0.9 % 100 mL IVPB  Status:  Discontinued     2 g 200 mL/hr over 30 Minutes Intravenous Every 8 hours 07/17/18 0014 07/17/18 0019   07/17/18 0300  ceFEPIme (MAXIPIME) 1 g in sodium chloride 0.9 %  100 mL IVPB  Status:  Discontinued     1 g 200 mL/hr over 30 Minutes Intravenous Every 8 hours 07/17/18 0019 07/26/18 0900   07/16/18 2345  cefTRIAXone (ROCEPHIN) 2 g in sodium chloride 0.9 % 100 mL IVPB  Status:  Discontinued     2 g 200 mL/hr over 30 Minutes Intravenous Every 24 hours 07/16/18 2343 07/16/18 2347   07/16/18 2345  metroNIDAZOLE (FLAGYL) tablet 500 mg  Status:  Discontinued     500 mg Oral Every 8 hours 07/16/18 2343 07/24/18 1134   07/16/18 2000  vancomycin (VANCOCIN) 2,000 mg in sodium chloride 0.9 % 500 mL IVPB     2,000 mg 250 mL/hr over 120 Minutes Intravenous  Once 07/16/18 1944 07/16/18 2209   07/16/18 1945  vancomycin (VANCOCIN) IVPB 1000 mg/200 mL premix  Status:  Discontinued     1,000 mg 200 mL/hr over 60 Minutes Intravenous  Once 07/16/18 1855 07/16/18 1944   07/16/18 1845  ceFEPIme (MAXIPIME) 2 g in sodium chloride 0.9 % 100 mL IVPB     2 g 200 mL/hr over 30 Minutes Intravenous  Once 07/16/18 1832 07/16/18 1909   07/16/18 1845  metroNIDAZOLE (FLAGYL) IVPB 500 mg  Status:  Discontinued     500 mg 100 mL/hr over 60 Minutes Intravenous Every 8 hours 07/16/18 1832 07/16/18 2343   07/16/18 1845  vancomycin (VANCOCIN) IVPB 1000 mg/200 mL premix  Status:  Discontinued     1,000 mg 200 mL/hr over 60 Minutes Intravenous  Once 07/16/18 1832 07/16/18 1944    .  POD/HD#: 45  32 y/o male, paraplegic with chronic L hip ulcer and osteomyelitis, L ankle ulcer    -chronic L hip ulcer and osteomyelitis, L ankle ulcer              skin graft looks good to L ankle   Dressing change either Thursday or Friday, pt can wait for [facility] to do     Recommend mepitel over the graft and then 4x4's and kerlix    Minimize pressure to area   VAC changed to left hip   Overall wound looks good   VAC change again on Friday 08/02/2018 by [facility]    VAC to be set at 125 mmHg, continuous suction     There are 4 sponge pieces in total      3 small linear strips (1 each over distal and proximal  limbs of the incision and the 3rd serving as a bridge between the two to ensure adequate suction)     The 4th piece is a medium sponge that was cut in a spiral shaft to fit in the wound. It is continuous and not in pieces     Would recommend HHRN apply vac in similar fashion, hopefully they will be able to use less sponge in wound as dead space closes    Pt will follow up with Dr. Sharol Given on Monday 08/05/2018 for long term follow up of his wounds     Continue to optimize nutrition   Minimize pressure to these areas   abx per ID     Order home hospital bed and air mattress overlay     - ID:  Continue per ID recs   Pt has picc    - Dispo:            dc home today   HHRN to do VAC change to L hip and dressing change to L ankle on Friday 08/02/2018  Follow up with Dr. Sharol Given on 08/05/2018 as outpt      Jari Pigg, PA-C [facility] [phone] ([phone] (O) [phone] (C) 07/30/2018, 10:04 AM

## 2018-07-30 NOTE — Progress Notes (Signed)
Orthopaedic Addendum   Dr Gavin Potters first appointment next week is 08/06/2018. Appointment time is for 1pm Would continue with HHRN vac change on Friday 08/02/2018. Would not have HHRN do vac change on Monday 9/9 and then likely resume vac MWF schedule on 08/09/2018 unless instructed otherwise by Dr. Annice Needy, PA-C [facility] [phone] (C) [phone] (O) 07/30/2018 11:13 AM

## 2018-07-30 NOTE — Anesthesia Postprocedure Evaluation (Signed)
Anesthesia Post Note  Patient: ADONIS YIM  Procedure(s) Performed: IRRIGATION AND DEBRIDEMENT EXTREMITY (Left Hip) APPLICATION OF WOUND VAC, left hip and left ankle (Left Hip) IRRIGATION AND DEBRIDEMENT, left ankle  (Left Ankle) SKIN GRAFT FULL THICKNESS (Left Ankle) DRESSING CHANGE UNDER ANESTHESIA (Right Ankle)     Patient location during evaluation: PACU Anesthesia Type: General Level of consciousness: awake, awake and alert and oriented Pain management: pain level controlled Vital Signs Assessment: post-procedure vital signs reviewed and stable Respiratory status: spontaneous breathing, nonlabored ventilation and respiratory function stable Cardiovascular status: blood pressure returned to baseline Anesthetic complications: no    Last Vitals:  Vitals:   07/29/18 2326 07/30/18 0624  BP: (!) 96/56 (!) 95/56  Pulse: 68 63  Resp: 18 18  Temp: 36.9 C 36.7 C  SpO2: 99% 97%    Last Pain:  Vitals:   07/30/18 0624  TempSrc: Oral  PainSc:                  Nalayah Hitt COKER
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
## 2018-07-30 NOTE — Progress Notes (Signed)
Forms for [employer] filled out and faxed to number provided per [facility] with [employer]. Pt has insurance approval for the wound vac but vac unable to be delivered to the hospital until 07/31/2018. Pt with orders for hospital bed and air overlay mattress. Jermaine with [facility] notified and will have DME delivered to patients home.  Pt with orders for [facility] RN for Vac dressing and IV antibiotics. Pam with [facility] IV therapy aware and has seen the patient.  Forms for expedited aides through Medicaid placed on chart and need to be signed by MD and faxed to number on the form.  Patient provided [employer] information and application for assistance with transportation. Patient to inform CM if mother can provide transport home tomorrow or if will need PTAR.

## 2018-07-30 NOTE — Progress Notes (Signed)
Nutrition Follow-up  INTERVENTION:   - Ensure Enlive po TID, each supplement provides 350 kcal and 20 grams of protein  - MVI with minerals daily  - HS snack  - Recommend to continue bowel regimen  - Recommend obtaining new measured weight pt's weight has been charted as the same weight since 8/20  NUTRITION DIAGNOSIS:   Increased nutrient needs related to wound healing as evidenced by estimated needs.  Ongoing  GOAL:   Patient will meet greater than or equal to 90% of their needs  Progressing  MONITOR:   PO intake, Supplement acceptance, Skin  REASON FOR ASSESSMENT:   Other (Comment), Consult Assessment of nutrition requirement/status, Wound healing  ASSESSMENT:   Sean Horton is a 32 y.o. male presenting with fever and tachycardia . PMH is significant for paraplegia 2/2 T3 spinal cord injury, chronic indwelling foley,   Noted pt with ~50% completed lunch tray at bedside. Pt had eaten half of a sandwich and some fries.  8/30 - s/p I&D of L hip, wound VAC change to L hip, skin grafting to L ankle, wound VAC change to L ankle  Spoke with pt at bedside who reports that his appetite remains poor related to constipation. Pt endorses taking Miralax when it is provided to him. Discussed importance of adequate fluid intake; pt states he does not think he is drinking enough water.  Pt endorses drinking all Ensure supplements provided (3 daily). Pt endorses eating an evening snack when it is provided. HS snack is ordered.  Meal Completion: 100%  Medications reviewed and include: MVI with minerals daily, Ensure Enlive TID, 40 mg Protonix daily, Miralax daily, Senokot-S daily, 220 mg zinc sulfate daily, IV Invanz  Labs reviewed: potassium 5.4 (H), hemoglobin 9.0 (L), HCT 31.1 (L)  UOP: 4200 ml x 24 hours I/O's: -10 L since admission  Diet Order:   Diet Order            Diet regular Room service appropriate? Yes; Fluid consistency: Thin  Diet effective now               EDUCATION NEEDS:   Education needs have been addressed  Skin:  Skin Assessment: Skin Integrity Issues: Stage IV: lt trochanter Unstageable: rt/lt lateral malleolus Wound Vac: ankle and hip  Last BM:  8/24 - medications ordered  Height:   Ht Readings from Last 1 Encounters:  07/26/18 6\' 5"  (1.956 m)    Weight:   Wt Readings from Last 1 Encounters:  07/26/18 90.7 kg    Ideal Body Weight:  86.5 kg  BMI:  Body mass index is 23.71 kg/m.  Estimated Nutritional Needs:   Kcal:  2400-2600  Protein:  130-145 grams  Fluid:  > 2.4 L    Gaynell Face, MS, RD, LDN Pager: [phone] Weekend/After Hours: [phone]

## 2018-07-31 MED ORDER — NALOXONE HCL 4 MG/0.1ML NA LIQD: 1.0000 | Freq: Once | NASAL | 0 refills | Status: AC

## 2018-07-31 MED ORDER — ERTAPENEM IV (FOR PTA / DISCHARGE USE ONLY): 1.0000 g | INTRAVENOUS | 0 refills | Status: AC

## 2018-07-31 MED ORDER — OXYCODONE HCL 5 MG PO TABS
10.0000 mg | ORAL_TABLET | Freq: Once | ORAL | Status: AC
  Administered 2018-07-31: 10 mg via ORAL
  Filled 2018-07-31: qty 2

## 2018-07-31 MED ORDER — OXYCODONE HCL ER 15 MG PO T12A
40.0000 mg | EXTENDED_RELEASE_TABLET | Freq: Two times a day (BID) | ORAL | Status: DC
  Administered 2018-07-31 – 2018-08-01 (×2): 40 mg via ORAL
  Filled 2018-07-31 (×2): qty 2

## 2018-07-31 MED ORDER — OXYCODONE HCL 5 MG PO TABS: 5.0000 mg | ORAL_TABLET | Freq: Two times a day (BID) | ORAL | 0 refills | Status: DC | PRN

## 2018-07-31 MED ORDER — HYDROMORPHONE HCL 1 MG/ML IJ SOLN
0.5000 mg | INTRAMUSCULAR | Status: AC | PRN
  Administered 2018-07-31 (×2): 0.5 mg via INTRAVENOUS
  Filled 2018-07-31 (×2): qty 0.5

## 2018-07-31 NOTE — Care Management Note (Addendum)
Case Management Note  Patient Details  Name: Sean Horton MRN: [id number] Date of Birth: May 27, 1986  Subjective/Objective:           Osteomyelitis         Action/Plan: Transition to home today. Home health services to follow. Start of care to begin 08/01/2018.   [employer] [name]  [phone] ) to provide and make exchange with wound vac prior to d/c.    NCM called  [facility] regarding of hospital bed and air mattress delivery. [name] ( dispatcher ) made NCM aware spoke with mom on yesterday and mom was to call him back to setup delivery time for today. Per [facility] delivery can between 1pm-5pm today, once call received from mom. NCM made call and spoke with pt's mom alerting her of the need to setup equipment delivery time. Mom stated she will call [facility] and setup delivery time and has taken the day off from work to assist with transitioning pt home today.  NCM called MD to sign form on front of chart for expedited aide services provided by Medicaid. NCM to fax form once completed to [facility], fax # [phone].   Pt will need PTAR services for transportation to home.  Expected Discharge Date:   07/31/2018            Expected Discharge Plan:  [name]  In-House Referral:     Discharge planning Services  CM Consult  Post Acute Care Choice:  Home Health Choice offered to:  Patient  DME Arranged:  Vac, Hospital bed, Air overlay mattress DME Agency:  NA, [employer]wound vac from [facility])  HH Arranged:  RN [facility] Agency:  [place]  Status of Service:  In process, will continue to follow  If discussed at Long Length of Stay Meetings, dates discussed:    Additional Comments:  [name], RN 07/31/2018, 10:33 AM

## 2018-07-31 NOTE — Plan of Care (Signed)
  Problem: Education: Goal: Knowledge of General Education information will improve Description Including pain rating scale, medication(s)/side effects and non-pharmacologic comfort measures Outcome: Completed/Met   Problem: Health Behavior/Discharge Planning: Goal: Ability to manage health-related needs will improve Outcome: Completed/Met   Problem: Clinical Measurements: Goal: Ability to maintain clinical measurements within normal limits will improve Outcome: Completed/Met Goal: Will remain free from infection Outcome: Completed/Met Goal: Diagnostic test results will improve Outcome: Completed/Met Goal: Respiratory complications will improve Outcome: Completed/Met Goal: Cardiovascular complication will be avoided Outcome: Completed/Met   Problem: Activity: Goal: Risk for activity intolerance will decrease Outcome: Completed/Met   Problem: Coping: Goal: Level of anxiety will decrease Outcome: Completed/Met   Problem: Elimination: Goal: Will not experience complications related to bowel motility Outcome: Completed/Met Goal: Will not experience complications related to urinary retention Outcome: Completed/Met   Problem: Pain Managment: Goal: General experience of comfort will improve Outcome: Completed/Met   Problem: Skin Integrity: Goal: Risk for impaired skin integrity will decrease Outcome: Completed/Met

## 2018-07-31 NOTE — Plan of Care (Signed)
  Problem: Education: Goal: Knowledge of General Education information will improve Description: Including pain rating scale, medication(s)/side effects and non-pharmacologic comfort measures Outcome: Progressing   Problem: Health Behavior/Discharge Planning: Goal: Ability to manage health-related needs will improve Outcome: Progressing   Problem: Clinical Measurements: Goal: Ability to maintain clinical measurements within normal limits will improve Outcome: Progressing Goal: Will remain free from infection Outcome: Progressing Goal: Diagnostic test results will improve Outcome: Progressing Goal: Respiratory complications will improve Outcome: Progressing Goal: Cardiovascular complication will be avoided Outcome: Progressing   Problem: Activity: Goal: Risk for activity intolerance will decrease Outcome: Progressing   Problem: Coping: Goal: Level of anxiety will decrease Outcome: Progressing   Problem: Elimination: Goal: Will not experience complications related to bowel motility Outcome: Progressing Goal: Will not experience complications related to urinary retention Outcome: Progressing   Problem: Pain Managment: Goal: General experience of comfort will improve Outcome: Progressing   Problem: Skin Integrity: Goal: Risk for impaired skin integrity will decrease Outcome: Progressing   

## 2018-07-31 NOTE — Progress Notes (Signed)
New delivery time for pt's home equipment , 3pm-7pm. Nurse to f/u with pt/mom.Once equipment is delivered to home  PTAR can be called for transportation to home. Whitman Hero RN,BSN,CM

## 2018-08-01 ENCOUNTER — Telehealth (INDEPENDENT_AMBULATORY_CARE_PROVIDER_SITE_OTHER): Payer: Self-pay | Admitting: Orthopedic Surgery

## 2018-08-01 DIAGNOSIS — G894 Chronic pain syndrome: Secondary | ICD-10-CM

## 2018-08-01 MED ORDER — HEPARIN SOD (PORK) LOCK FLUSH 100 UNIT/ML IV SOLN
250.0000 [IU] | INTRAVENOUS | Status: AC | PRN
  Administered 2018-08-01: 250 [IU]

## 2018-08-01 MED ORDER — HYDROMORPHONE HCL 1 MG/ML IJ SOLN
0.5000 mg | Freq: Once | INTRAMUSCULAR | Status: AC
  Administered 2018-08-01: 0.5 mg via INTRAVENOUS
  Filled 2018-08-01: qty 0.5

## 2018-08-01 NOTE — Telephone Encounter (Signed)
[facility]  [phone]    Verbal orders  3 times a week for wound vac Patient is currently on IV antibiotics

## 2018-08-01 NOTE — Progress Notes (Addendum)
Discharge instructions, RX's and follow up appts explained and provided to patient verbalized understanding. PICC line flushed by IV team prior to d/c. [employer] wound vac sent home with patients mother last night. PTAR services transporting patient home.  Allah Reason, Tivis Ringer, RN

## 2018-08-01 NOTE — Progress Notes (Signed)
PTAR called for transportation to home, nurse and pt made aware. Whitman Hero RN,BSN,CM

## 2018-08-01 NOTE — Discharge Summary (Signed)
[facility] Discharge Summary  Patient name: Sean Horton Medical record number: [id number] Date of birth: June 08, 1986 Age: 32 y.o. Gender: male Date of Admission: 07/16/2018  Date of Discharge: 9/5 (previously 9/4, but stayed for additional pain control)  Admitting Physician: Martyn Malay, MD  Primary Care Provider: [name], DO Consultants: Ortho   Indication for Hospitalization: Osteomyelitis of L hip ulcer  Discharge Diagnoses/Problem List:  L hip ulcer  Normocytic anemia  Paraplegia, T3 spinal cord injury Bilateral lateral malleolar ulcers, chronic Sacral ulcer, chronic Hypoalbuminemia Neurogenic bladder Thrombocytosis   Disposition: To home with home health, wound care   Discharge Condition: Stable  Discharge Exam:  General: Alert, NAD HEENT: NCAT, MMM Cardiac: RRR no m/g/r Lungs: Clear bilaterally, no increased WOB  Abdomen: soft, non-tender, non-distended, normoactive BS Ext: Warm, dry, 2+ distal pulses, wound vac in place of L hip ulcer. Ankles both in dressings.  Psych: depressed mood   Brief Hospital Course:  Sean Horton is a 32 year old male, with a past history significiant for paraplegia via T3 spinal cord injury and chronic ulcers, that presented with increased purulent drainage from his L hip ulcer for the past few weeks. Initially presented febrile and tachycardic with an elevated WBC of 18.4. He was started on IV Vancomycin, cefepime, and flagyl with fluid resuscitation. Vitals normalized following therapy. BC remained negative. MRI hip obtained showing osteomyelitis of L acetabulum, greater trochanter, distal sacrum, and coccyx. Orthopedic surgery was consulted; performed a bone biopsy and placed a wound vac on 8/23, and then returned to the OR on 8/26 for wound vac change and further debridement.  This procedure was also repeated on 8/30, with a wound vac change on 9/3. PICC was placed on 8/25 to continue broad spectrum  antibiotics as above for 6 weeks per ID recommendation. Bone biopsy showed MSSA and ID recommended continuing IV ertapenem via PICC until 10/4, with weekly labs while on therapy.    Patient is chronically anemic, and required 5 units of pRBCs during his stay, thought to be secondary to hemodilution and surgical related blood loss. Last Hgb 9.0 on 9/3. His other chronic medical conditions were appropriately managed during his stay, with wound care provided for his additional chronic ulcers on sacrum and bilateral malleoli. His chronic indwelling catheter was replaced on 8/21.   Issues for Follow Up:  1. Please obtain a repeat CBC with diff, BMP, CRP and ESR on a weekly basis and at completion of antibiotic therapy. CRP and ESR on 07/30/18 3.6 and 69 respectively. Continues IV ertapenem via PICC until 10/4.  2. Sent home with [facility] wound care with home wound vac, will need a wound vac change on 9/6 and follow up with Dr. Sharol Given on 08/06/2018 at 1pm. After that appointment, likely return to wound vac changes 3x weekly MWF unless instructed otherwise by Dr. Sharol Given. 3. Additional orthopedic recommendations for home health: Daily dressing changes to R ankle with nonstick layer (mepitel or adaptic), 4x4 gauze and kerlix. Dressing changes to Left ankle every other day starting on 08/02/2018 with same materials as above. 3.   Iron supplementation when no longer acutely infected. Consider GI referral outpatient for chronic anemia. Hgb 9 at d/c.  4.   Provided 12 additional doses of oxycodone 17m for breakthrough pain to get through until follow up appointment on            Monday on top of his home chronic pain medications. Please re-evaluate for additional  medication need.   Significant Procedures:  8/23 Bone Biopsy: MSSA, and wound vac placement 8/25 PICC Line Placement 8/26 Debridement and wound vac change 8/30 Debridement and wound vac placement  9/3 Wound vac change   Significant Labs and Imaging:  Recent Labs   Lab 07/28/18 0506 07/29/18 0343 07/30/18 0429  WBC 13.9* 12.5* 10.6*  HGB 10.1* 10.2* 9.0*  HCT 34.3* 34.7* 31.1*  PLT 533* 503* 443*   Recent Labs  Lab 07/26/18 0421 07/27/18 0420 07/28/18 0506 07/29/18 0343 07/30/18 0429  NA 138 141 137 139 138  K 4.8 4.5 5.1 5.2* 5.4*  CL 105 111 104 103 103  CO2 27 26 27 25 26   GLUCOSE 127* 88 184* 117* 102*  BUN 18 17 16  22* 18  CREATININE 0.83 0.71 0.90 0.96 1.05  CALCIUM 8.8* 7.6* 8.9 9.0 9.1     Results/Tests Pending at Time of Discharge: None   Discharge Medications:  Allergies as of 08/01/2018   No Known Allergies     Medication List    STOP taking these medications   acetaminophen 325 MG tablet Commonly known as:  TYLENOL   cyanocobalamin 1000 MCG tablet   lidocaine 5 % ointment Commonly known as:  XYLOCAINE   Zinc Oxide 12.8 % ointment Commonly known as:  TRIPLE PASTE     TAKE these medications   baclofen 20 MG tablet Commonly known as:  LIORESAL Take 40 mg by mouth 2 (two) times daily.   CRANBERRY PO Take 1 capsule by mouth daily.   DULoxetine 60 MG capsule Commonly known as:  CYMBALTA Take 60 mg by mouth 2 (two) times daily.   ENSURE Take 237 mLs by mouth 3 (three) times daily between meals.   ertapenem  IVPB Commonly known as:  INVANZ Inject 1 g into the vein daily. Indication: osteomyelitis Last Day of Therapy: 08/30/18   Labs - Once weekly:  CBC/D and BMP, Labs - Every other week:  ESR and CRP Leave PICC until seen by MD   FEROSUL 325 (65 FE) MG tablet Generic drug:  ferrous sulfate TAKE 1 TABLET(325 MG) BY MOUTH TWICE DAILY WITH A MEAL What changed:  See the new instructions.   fexofenadine-pseudoephedrine 180-240 MG 24 hr tablet Commonly known as:  ALLEGRA-D 24 Take 1 tablet by mouth daily.   folic acid 1 MG tablet Commonly known as:  FOLVITE Take 1 mg by mouth daily.   ibuprofen 800 MG tablet Commonly known as:  ADVIL,MOTRIN TAKE 1 TABLET BY MOUTH EVERY 8 HOURS AS NEEDED What  changed:  reasons to take this   omeprazole 20 MG capsule Commonly known as:  PRILOSEC Take 1 capsule (20 mg total) by mouth daily.   OVER THE COUNTER MEDICATION Place 1 spray into both nostrils 2 (two) times daily as needed (congestion). Over the counter nasal spray   oxybutynin 5 MG tablet Commonly known as:  DITROPAN TAKE 1 TABLET(5 MG) BY MOUTH THREE TIMES DAILY What changed:  See the new instructions.   oxyCODONE 5 MG immediate release tablet Commonly known as:  Oxy IR/ROXICODONE Take 1 tablet (5 mg total) by mouth 2 (two) times daily as needed for up to 12 doses for severe pain or breakthrough pain.   oxyCODONE-acetaminophen 10-325 MG tablet Commonly known as:  PERCOCET Take 1 tablet by mouth every 8 (eight) hours as needed for pain.   polyethylene glycol packet Commonly known as:  MIRALAX / GLYCOLAX Take 17 g by mouth daily as needed for mild constipation.  pregabalin 300 MG capsule Commonly known as:  LYRICA Take 300 mg by mouth 2 (two) times daily.   XTAMPZA ER 36 MG C12a Generic drug:  oxyCODONE ER Take 36 mg by mouth every 12 (twelve) hours.   zinc sulfate 220 (50 Zn) MG capsule Take 1 capsule (220 mg total) by mouth daily.     ASK your doctor about these medications   naloxone 4 MG/0.1ML Liqd nasal spray kit Commonly known as:  NARCAN Place 1 spray into the nose once for 1 dose. Ask about: Should I take this medication?            Home Infusion Instuctions  (From admission, onward)         Start     Ordered   07/31/18 0000  Home infusion instructions [facility] May follow [place] Dosing Protocol; May administer Cathflo as needed to maintain patency of vascular access device.; Flushing of vascular access device: per [facility] Protocol: 0.9% NaCl pre/post medica...    Question Answer Comment  Instructions May follow [place] Dosing Protocol   Instructions May administer Cathflo as needed to maintain patency of vascular access device.    Instructions Flushing of vascular access device: per [facility] Protocol: 0.9% NaCl pre/post medication administration and prn patency; Heparin 100 u/ml, 75m for implanted ports and Heparin 10u/ml, 556mfor all other central venous catheters.   Instructions May follow [facility] Anaphylaxis Protocol for First Dose Administration in the home: 0.9% NaCl at 25-50 ml/hr to maintain IV access for protocol meds. Epinephrine 0.3 ml IV/IM PRN and Benadryl 25-50 IV/IM PRN s/s of anaphylaxis.   Instructions [facility] Infusion Coordinator (RN) to assist per patient IV care needs in the home PRN.      07/31/18 [place](From admission, onward)         Start     Ordered   07/30/18 0944  For home use only DME Hospital bed  Once    Question Answer Comment  Patient has (list medical condition): paraplegic, numerous pressure sores   The above medical condition requires: Patient requires the ability to reposition frequently   Bed type Semi-electric   Trapeze Bar Yes   Support Surface: Low Air loss Mattress      07/30/18 0944   07/30/18 0944  For home use only DME Air overlay mattress  Once     07/30/18 0944   07/29/18 0940  For home use only DME Tub bench  Once     07/29/18 0939   07/26/18 1014  For home use only DME Negative pressure wound device  Once    Question Answer Comment  Frequency of dressing change 3 times per week   Length of need 3 Months   Dressing type Foam   Amount of suction 100 mm/Hg   Pressure application Continuous pressure   Supplies 10 canisters and 15 dressings per month for duration of therapy      07/26/18 1013          Discharge Instructions: Please refer to Patient Instructions section of EMR for full details.  Patient was counseled important signs and symptoms that should prompt return to medical care, changes in medications, dietary instructions, activity restrictions, and follow up appointments.   Follow-Up  Appointments: Follow-up Information    DuNewt MinionMD. Go on 08/06/2018.   Specialty:  Orthopedic Surgery Why:  appointment at 1pm bring wound  vac supplies to appointment Contact information: [place] [address] [phone]        Everrett Coombe, MD. Go on 08/05/2018.   Specialty:  Family Medicine Why:  1:50 pm, please arrive 15 minutes early.  Contact information: [address] [place] [address] [phone]          Greater than 30 minutes time was spent for counseling and coordination of care for discharging this patient.   Patriciaann Clan, DO 08/01/2018, 2:07 PM PGY-1, [place]

## 2018-08-02 ENCOUNTER — Telehealth: Payer: Self-pay

## 2018-08-02 NOTE — Telephone Encounter (Signed)
Pt has an appt on 08/06/18 called and lm on vm for wound vac orders as below and as dictated in discharge summary. To call with questions.

## 2018-08-02 NOTE — Telephone Encounter (Signed)
Donita with [facility] called. Patient D/C'ed yesterday and need verbal order that Dr Vanetta Shawl will follow and sign orders for home care.  Call back is [phone]  Danley Danker, RN [facility])

## 2018-08-02 NOTE — Telephone Encounter (Signed)
Returned call to [employer], verbal orders given.  Lucila Maine, DO PGY-3, [facility] 08/02/2018 12:32 PM

## 2018-08-05 ENCOUNTER — Ambulatory Visit: Payer: Medicaid Other | Admitting: Family Medicine

## 2018-08-05 ENCOUNTER — Ambulatory Visit: Payer: Medicaid Other | Admitting: Student in an Organized Health Care Education/Training Program

## 2018-08-06 ENCOUNTER — Ambulatory Visit (INDEPENDENT_AMBULATORY_CARE_PROVIDER_SITE_OTHER): Payer: Self-pay | Admitting: Orthopedic Surgery

## 2018-08-06 ENCOUNTER — Telehealth (INDEPENDENT_AMBULATORY_CARE_PROVIDER_SITE_OTHER): Payer: Self-pay | Admitting: Orthopedic Surgery

## 2018-08-06 NOTE — Telephone Encounter (Signed)
Sharee Pimple -nurse with [facility] called needing orders for care for the patient's ankle if there are any. The number to contact Sharee Pimple is [phone]

## 2018-08-06 NOTE — Telephone Encounter (Signed)
Can you please call pt and make appt? He missed his appt today.

## 2018-08-07 ENCOUNTER — Other Ambulatory Visit: Payer: Self-pay | Admitting: Family Medicine

## 2018-08-07 NOTE — Telephone Encounter (Signed)
Left vm for patient to give [place] a call to schedule an appt.

## 2018-08-09 ENCOUNTER — Telehealth: Payer: Self-pay | Admitting: *Deleted

## 2018-08-09 NOTE — Telephone Encounter (Signed)
Pt states that he is waiting on wound care orders from [facility].    Called and spoke [place] ( previous phone note) and she is working on getting those orders from Dr. Sharol Given.    Pt is aware. Fleeger, [name], CMA

## 2018-08-14 ENCOUNTER — Telehealth (INDEPENDENT_AMBULATORY_CARE_PROVIDER_SITE_OTHER): Payer: Self-pay | Admitting: Orthopedic Surgery

## 2018-08-14 NOTE — Telephone Encounter (Signed)
Patient left a voicemail stating he just got out of the hospital and he is requesting dressing orders so he can let home health nurse know the correct supplies to order and frequency. Please advise patient # [phone]

## 2018-08-14 NOTE — Telephone Encounter (Signed)
Paulino Door; [place] at [phone] and stated that she needs to refer to Dr Marcelino Scot or his PA for dressing change for patient concerning his Left- ankle skin graft. She understood the verbal information and will be calling him.

## 2018-08-29 ENCOUNTER — Telehealth (INDEPENDENT_AMBULATORY_CARE_PROVIDER_SITE_OTHER): Payer: Self-pay | Admitting: Orthopedic Surgery

## 2018-08-29 ENCOUNTER — Telehealth: Payer: Self-pay | Admitting: *Deleted

## 2018-08-29 NOTE — Telephone Encounter (Signed)
[facility] left a message, I could not understand what she said her name was, but she is requesting an order to use the white foam under the black foam for his wound vac.  CB#[phone].  Thank you.

## 2018-08-29 NOTE — Telephone Encounter (Signed)
Donita would like verbal orders for a wound on patients left leg.  She would like verbal orders for calcium alginate to be applied.  You can call and LMOVM. Fleeger, [name], CMA

## 2018-08-29 NOTE — Telephone Encounter (Signed)
Called and advised that this pt has not been seen in the office and did have surgery with Dr. Marcelino Scot 07/26/18 should refer to that office for dressing changes.

## 2018-09-02 NOTE — Telephone Encounter (Signed)
Left VM for verbal orders.

## 2018-09-04 ENCOUNTER — Telehealth (INDEPENDENT_AMBULATORY_CARE_PROVIDER_SITE_OTHER): Payer: Self-pay | Admitting: Orthopedic Surgery

## 2018-09-04 NOTE — Telephone Encounter (Signed)
Sean Horton with [facility] request order for removal of picc line. Sean Horton's callbakc # [phone]

## 2018-09-05 ENCOUNTER — Inpatient Hospital Stay (INDEPENDENT_AMBULATORY_CARE_PROVIDER_SITE_OTHER): Payer: Medicaid Other | Admitting: Physician Assistant

## 2018-09-05 NOTE — Telephone Encounter (Signed)
Pt has an appt with Dr. Sharol Given on Tuesday. Will address all concerns once the pt has been assessed.

## 2018-09-05 NOTE — Telephone Encounter (Signed)
This is a Dr. Marcelino Scot pt. We have not done surgery on this pt. He has never had any follow up from his August surgery. He is on the sch to see Shawn tomorrow will hold pending his appt tomorrow.

## 2018-09-06 ENCOUNTER — Inpatient Hospital Stay (INDEPENDENT_AMBULATORY_CARE_PROVIDER_SITE_OTHER): Payer: Medicaid Other | Admitting: Physician Assistant

## 2018-09-10 ENCOUNTER — Ambulatory Visit (INDEPENDENT_AMBULATORY_CARE_PROVIDER_SITE_OTHER): Payer: Medicaid Other | Admitting: Orthopedic Surgery

## 2018-09-10 ENCOUNTER — Encounter (INDEPENDENT_AMBULATORY_CARE_PROVIDER_SITE_OTHER): Payer: Self-pay | Admitting: Orthopedic Surgery

## 2018-09-10 DIAGNOSIS — S71002S Unspecified open wound, left hip, sequela: Secondary | ICD-10-CM

## 2018-09-10 DIAGNOSIS — L97322 Non-pressure chronic ulcer of left ankle with fat layer exposed: Secondary | ICD-10-CM | POA: Diagnosis not present

## 2018-09-10 DIAGNOSIS — G8220 Paraplegia, unspecified: Secondary | ICD-10-CM

## 2018-09-10 MED ORDER — SILVER SULFADIAZINE 1 % EX CREA: 1.0000 "application " | TOPICAL_CREAM | Freq: Every day | CUTANEOUS | 3 refills | Status: DC
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
## 2018-09-11 ENCOUNTER — Telehealth (INDEPENDENT_AMBULATORY_CARE_PROVIDER_SITE_OTHER): Payer: Self-pay | Admitting: Orthopedic Surgery

## 2018-09-11 NOTE — Telephone Encounter (Signed)
Sharee Pimple -nurse from [facility] called needing orders to remove patient's  picc line. The number to contact Sharee Pimple is [phone]

## 2018-09-11 NOTE — Telephone Encounter (Signed)
Did you want to give order for this pt ti have PICC line removed? Saw in office yesterday

## 2018-09-11 NOTE — Telephone Encounter (Signed)
Yes, give order to remove PICC line

## 2018-09-12 NOTE — Telephone Encounter (Signed)
I called and sw Sean Horton to advise ok to pull PICC line

## 2018-09-27 ENCOUNTER — Telehealth (INDEPENDENT_AMBULATORY_CARE_PROVIDER_SITE_OTHER): Payer: Self-pay | Admitting: Orthopedic Surgery

## 2018-09-27 NOTE — Telephone Encounter (Signed)
Donita, nurse at [facility] is calling for recertification of orders and requesting to see patient 3 times a week.  Also, to see if she can use white foam underneath black foam in the deeper areas?  CB # [phone]

## 2018-09-27 NOTE — Telephone Encounter (Signed)
Called to give verbal ok for orders below. sw donita to advise and she will call with questions.

## 2018-10-01 ENCOUNTER — Ambulatory Visit (INDEPENDENT_AMBULATORY_CARE_PROVIDER_SITE_OTHER): Payer: Medicaid Other | Admitting: Orthopedic Surgery

## 2018-10-02 ENCOUNTER — Telehealth: Payer: Self-pay

## 2018-10-02 NOTE — Telephone Encounter (Signed)
Sharee Pimple with [facility] calling for verbal orders:  MSW consult and  Order to get urine sample d/t UTI symptoms.   Call back is [phone]  Danley Danker, RN [facility] [place])

## 2018-10-03 NOTE — Telephone Encounter (Signed)
Verbal orders given to [place].  Lucila Maine, DO PGY-3, [facility] 10/03/2018 12:58 PM

## 2018-10-21 NOTE — Telephone Encounter (Signed)
Sean Horton called to get urine sample orders verbally changed to todays date.  Pt was unable to go last time.   Verbal orders given. [place] Constantine, [name], CMA

## 2018-10-29 ENCOUNTER — Telehealth: Payer: Self-pay | Admitting: *Deleted

## 2018-10-29 NOTE — Telephone Encounter (Signed)
Pt would like to know the results of the urine that home health took.  He is "pretty sure he has an infection".  Paper results were faxed and are in PCP box.  Will forward to MD. Fleeger, [name], [place]

## 2018-10-30 NOTE — Telephone Encounter (Signed)
I'm sorry I'm in the ED for today and tomorrow, can you please tell me his urine results?

## 2018-10-30 NOTE — Telephone Encounter (Signed)
I am sorry for the delay.  Here are the abnormal results:  Specific Gravity: >=1.030 Color: Red Appearance: Turbid WBC esterase: 2+ Protein: 3+ Occult Blood: 3+ Nitrate: Positive  Microscopy: WBC: 6-10 (clumps of leukocytes present) RBC: >30  I have placed the form back in your box. Fleeger, [name], CMA

## 2018-11-01 NOTE — Telephone Encounter (Signed)
  I have reviewed the faxed over urine culture results which were mixed urogenital flora, nothing to treat. Please let patient know. If he feels he has symptoms still or has fevers, he needs to be seen. Thank you

## 2018-11-04 NOTE — Telephone Encounter (Signed)
lmovm for pt to return call. Greysen Devino Dawn, CMA  

## 2018-11-04 NOTE — Telephone Encounter (Signed)
Pt informed. Sean Horton, CMA  

## 2018-11-15 ENCOUNTER — Telehealth (INDEPENDENT_AMBULATORY_CARE_PROVIDER_SITE_OTHER): Payer: Self-pay | Admitting: Orthopedic Surgery

## 2018-11-15 NOTE — Telephone Encounter (Signed)
Noted  

## 2018-11-15 NOTE — Telephone Encounter (Signed)
[place]  [phone]     Glenard Haring called wanted to let Dr.Duda know patient has missed his skilled nursing visits and not returning phone calls.

## 2018-11-16 ENCOUNTER — Telehealth: Payer: Self-pay | Admitting: Family Medicine

## 2018-11-16 NOTE — Telephone Encounter (Signed)
For last telephone note  Received after hours page emergency line  Received call from home health nurse who is visiting Sean Horton.  Found to have blood-tinged urine in his Foley bag.  Patient is quadriplegic and needs to be straight cathed intermittently.  Per chart patient had similar finding on 10/29/2018.  Home health did urinalysis which showed nitrites.  Per faxed over urine culture results patient had mixed urogenital flora with nothing treat.  Patient with no fevers, no altered mental status, and no symptoms from his urogenital system.  They could be guided for patient to come in to be seen on Monday, 11/18/2018.  Urine analysis and urine culture can be drawn at that time.  Patient develops alarming symptoms such as fevers, altered mental status, increased bloodiness or purulent urine informed home health to let them know to present to emergency department.  Home health in agreement and acknowledged the plan  Guadalupe Dawn MD PGY-2 Family Medicine Resident

## 2018-11-17 ENCOUNTER — Inpatient Hospital Stay (HOSPITAL_COMMUNITY)
Admission: EM | Admit: 2018-11-17 | Discharge: 2018-11-22 | DRG: 698 | Disposition: A | Discharge status: Home Health | Payer: Medicaid Other | Attending: Family Medicine | Admitting: Family Medicine

## 2018-11-17 ENCOUNTER — Encounter (HOSPITAL_COMMUNITY): Payer: Self-pay

## 2018-11-17 DIAGNOSIS — L89523 Pressure ulcer of left ankle, stage 3: Secondary | ICD-10-CM | POA: Diagnosis present

## 2018-11-17 DIAGNOSIS — T83511A Infection and inflammatory reaction due to indwelling urethral catheter, initial encounter: Principal | ICD-10-CM | POA: Diagnosis present

## 2018-11-17 DIAGNOSIS — D62 Acute posthemorrhagic anemia: Secondary | ICD-10-CM | POA: Diagnosis present

## 2018-11-17 DIAGNOSIS — A419 Sepsis, unspecified organism: Secondary | ICD-10-CM | POA: Diagnosis present

## 2018-11-17 DIAGNOSIS — R319 Hematuria, unspecified: Secondary | ICD-10-CM | POA: Diagnosis not present

## 2018-11-17 DIAGNOSIS — K592 Neurogenic bowel, not elsewhere classified: Secondary | ICD-10-CM | POA: Diagnosis present

## 2018-11-17 DIAGNOSIS — Z87440 Personal history of urinary (tract) infections: Secondary | ICD-10-CM | POA: Diagnosis not present

## 2018-11-17 DIAGNOSIS — K5900 Constipation, unspecified: Secondary | ICD-10-CM | POA: Diagnosis not present

## 2018-11-17 DIAGNOSIS — S24102S Unspecified injury at T2-T6 level of thoracic spinal cord, sequela: Secondary | ICD-10-CM

## 2018-11-17 DIAGNOSIS — F17290 Nicotine dependence, other tobacco product, uncomplicated: Secondary | ICD-10-CM | POA: Diagnosis present

## 2018-11-17 DIAGNOSIS — L89224 Pressure ulcer of left hip, stage 4: Secondary | ICD-10-CM | POA: Diagnosis present

## 2018-11-17 DIAGNOSIS — N3289 Other specified disorders of bladder: Secondary | ICD-10-CM | POA: Diagnosis present

## 2018-11-17 DIAGNOSIS — G9050 Complex regional pain syndrome I, unspecified: Secondary | ICD-10-CM | POA: Diagnosis present

## 2018-11-17 DIAGNOSIS — N319 Neuromuscular dysfunction of bladder, unspecified: Secondary | ICD-10-CM | POA: Diagnosis present

## 2018-11-17 DIAGNOSIS — I9589 Other hypotension: Secondary | ICD-10-CM | POA: Diagnosis not present

## 2018-11-17 DIAGNOSIS — R0689 Other abnormalities of breathing: Secondary | ICD-10-CM

## 2018-11-17 DIAGNOSIS — G8220 Paraplegia, unspecified: Secondary | ICD-10-CM | POA: Diagnosis not present

## 2018-11-17 DIAGNOSIS — Z79899 Other long term (current) drug therapy: Secondary | ICD-10-CM | POA: Diagnosis not present

## 2018-11-17 DIAGNOSIS — G9340 Encephalopathy, unspecified: Secondary | ICD-10-CM | POA: Diagnosis not present

## 2018-11-17 DIAGNOSIS — D638 Anemia in other chronic diseases classified elsewhere: Secondary | ICD-10-CM | POA: Diagnosis present

## 2018-11-17 DIAGNOSIS — M62838 Other muscle spasm: Secondary | ICD-10-CM | POA: Diagnosis present

## 2018-11-17 DIAGNOSIS — W3400XS Accidental discharge from unspecified firearms or gun, sequela: Secondary | ICD-10-CM

## 2018-11-17 DIAGNOSIS — Z960 Presence of urogenital implants: Secondary | ICD-10-CM

## 2018-11-17 DIAGNOSIS — N390 Urinary tract infection, site not specified: Secondary | ICD-10-CM | POA: Diagnosis present

## 2018-11-17 DIAGNOSIS — M542 Cervicalgia: Secondary | ICD-10-CM | POA: Diagnosis present

## 2018-11-17 DIAGNOSIS — R4182 Altered mental status, unspecified: Secondary | ICD-10-CM | POA: Diagnosis not present

## 2018-11-17 DIAGNOSIS — I959 Hypotension, unspecified: Secondary | ICD-10-CM | POA: Diagnosis present

## 2018-11-17 DIAGNOSIS — R0600 Dyspnea, unspecified: Secondary | ICD-10-CM

## 2018-11-17 DIAGNOSIS — L89622 Pressure ulcer of left heel, stage 2: Secondary | ICD-10-CM | POA: Diagnosis present

## 2018-11-17 DIAGNOSIS — R109 Unspecified abdominal pain: Secondary | ICD-10-CM

## 2018-11-17 DIAGNOSIS — Z978 Presence of other specified devices: Secondary | ICD-10-CM

## 2018-11-17 DIAGNOSIS — L98499 Non-pressure chronic ulcer of skin of other sites with unspecified severity: Secondary | ICD-10-CM | POA: Diagnosis present

## 2018-11-17 DIAGNOSIS — Y846 Urinary catheterization as the cause of abnormal reaction of the patient, or of later complication, without mention of misadventure at the time of the procedure: Secondary | ICD-10-CM | POA: Diagnosis present

## 2018-11-17 DIAGNOSIS — L98492 Non-pressure chronic ulcer of skin of other sites with fat layer exposed: Secondary | ICD-10-CM | POA: Diagnosis not present

## 2018-11-17 DIAGNOSIS — E861 Hypovolemia: Secondary | ICD-10-CM | POA: Diagnosis not present

## 2018-11-17 DIAGNOSIS — G8221 Paraplegia, complete: Secondary | ICD-10-CM | POA: Diagnosis present

## 2018-11-17 LAB — COMPREHENSIVE METABOLIC PANEL
ALK PHOS: 136 U/L — AB (ref 38–126)
ALT: 11 U/L (ref 0–44)
AST: 24 U/L (ref 15–41)
Albumin: 2.6 g/dL — ABNORMAL LOW (ref 3.5–5.0)
Anion gap: 11 (ref 5–15)
BILIRUBIN TOTAL: 0.5 mg/dL (ref 0.3–1.2)
BUN: 53 mg/dL — AB (ref 6–20)
CALCIUM: 9.6 mg/dL (ref 8.9–10.3)
CO2: 25 mmol/L (ref 22–32)
CREATININE: 1.24 mg/dL (ref 0.61–1.24)
Chloride: 101 mmol/L (ref 98–111)
GFR calc Af Amer: >60 mL/min (ref >60)
GFR calc non Af Amer: >60 mL/min (ref >60)
GLUCOSE: 126 mg/dL — AB (ref 70–99)
Potassium: 5 mmol/L (ref 3.5–5.1)
Sodium: 137 mmol/L (ref 135–145)
TOTAL PROTEIN: 10.1 g/dL — AB (ref 6.5–8.1)

## 2018-11-17 LAB — URINALYSIS, COMPLETE (UACMP) WITH MICROSCOPIC
Bilirubin Urine: NEGATIVE
Glucose, UA: NEGATIVE mg/dL
Ketones, ur: NEGATIVE mg/dL
Nitrite: NEGATIVE
PH: 5.5 (ref 5.0–8.0)
Protein, ur: 100 mg/dL — AB
Specific Gravity, Urine: >1.03 — ABNORMAL HIGH (ref 1.005–1.030)
WBC, UA: >50 WBC/hpf (ref 0–5)

## 2018-11-17 LAB — CBC WITH DIFFERENTIAL/PLATELET
ABS IMMATURE GRANULOCYTES: 0.05 10*3/uL (ref 0.00–0.07)
BASOS PCT: 1 %
Basophils Absolute: 0.1 10*3/uL (ref 0.0–0.1)
EOS ABS: 0.4 10*3/uL (ref 0.0–0.5)
Eosinophils Relative: 4 %
HCT: 31.9 % — ABNORMAL LOW (ref 39.0–52.0)
Hemoglobin: 9 g/dL — ABNORMAL LOW (ref 13.0–17.0)
Immature Granulocytes: 1 %
Lymphocytes Relative: 20 %
Lymphs Abs: 2.1 10*3/uL (ref 0.7–4.0)
MCH: 19.8 pg — AB (ref 26.0–34.0)
MCHC: 28.2 g/dL — ABNORMAL LOW (ref 30.0–36.0)
MCV: 70.1 fL — AB (ref 80.0–100.0)
MONO ABS: 0.6 10*3/uL (ref 0.1–1.0)
MONOS PCT: 5 %
NEUTROS ABS: 7.2 10*3/uL (ref 1.7–7.7)
Neutrophils Relative %: 69 %
PLATELETS: 582 10*3/uL — AB (ref 150–400)
RBC: 4.55 MIL/uL (ref 4.22–5.81)
RDW: 18.6 % — ABNORMAL HIGH (ref 11.5–15.5)
WBC: 10.4 10*3/uL (ref 4.0–10.5)
nRBC: 0 % (ref 0.0–0.2)

## 2018-11-17 LAB — CALCIUM: CALCIUM: 8.3 mg/dL — AB (ref 8.9–10.3)

## 2018-11-17 LAB — PHOSPHORUS: Phosphorus: 3.7 mg/dL (ref 2.5–4.6)

## 2018-11-17 LAB — MAGNESIUM: Magnesium: 2.1 mg/dL (ref 1.7–2.4)

## 2018-11-17 LAB — HEMOGLOBIN A1C
Hgb A1c MFr Bld: 5.6 % (ref 4.8–5.6)
Mean Plasma Glucose: 114.02 mg/dL

## 2018-11-17 LAB — TSH: TSH: 1.158 u[IU]/mL (ref 0.350–4.500)

## 2018-11-17 LAB — BRAIN NATRIURETIC PEPTIDE: B Natriuretic Peptide: 8.3 pg/mL (ref 0.0–100.0)

## 2018-11-17 LAB — I-STAT CG4 LACTIC ACID, ED: Lactic Acid, Venous: 1.6 mmol/L (ref 0.5–1.9)

## 2018-11-17 LAB — CREATININE, SERUM
Creatinine, Ser: 1.16 mg/dL (ref 0.61–1.24)
GFR calc Af Amer: >60 mL/min (ref >60)
GFR calc non Af Amer: >60 mL/min (ref >60)

## 2018-11-17 LAB — TROPONIN I: Troponin I: <0.03 ng/mL (ref <0.03)

## 2018-11-17 MED ORDER — ACETAMINOPHEN 650 MG RE SUPP: 650.0000 mg | Freq: Four times a day (QID) | RECTAL | Status: DC | PRN

## 2018-11-17 MED ORDER — SORBITOL 70 % SOLN
30.0000 mL | Freq: Every day | Status: DC | PRN
  Filled 2018-11-17: qty 30

## 2018-11-17 MED ORDER — DOCUSATE SODIUM 100 MG PO CAPS
100.0000 mg | ORAL_CAPSULE | Freq: Two times a day (BID) | ORAL | Status: DC
  Administered 2018-11-17 – 2018-11-19 (×5): 100 mg via ORAL
  Filled 2018-11-17 (×6): qty 1

## 2018-11-17 MED ORDER — KETOROLAC TROMETHAMINE 30 MG/ML IJ SOLN
30.0000 mg | Freq: Once | INTRAMUSCULAR | Status: AC
  Administered 2018-11-17: 30 mg via INTRAVENOUS
  Filled 2018-11-17: qty 1

## 2018-11-17 MED ORDER — ZINC SULFATE 220 (50 ZN) MG PO CAPS
220.0000 mg | ORAL_CAPSULE | Freq: Every day | ORAL | Status: DC
  Administered 2018-11-17 – 2018-11-22 (×6): 220 mg via ORAL
  Filled 2018-11-17 (×6): qty 1

## 2018-11-17 MED ORDER — SODIUM CHLORIDE 0.9 % IV SOLN
2.0000 g | Freq: Once | INTRAVENOUS | Status: AC
  Administered 2018-11-17: 2 g via INTRAVENOUS
  Filled 2018-11-17: qty 2

## 2018-11-17 MED ORDER — SODIUM CHLORIDE 0.9 % IV SOLN
2.0000 g | INTRAVENOUS | Status: DC
  Administered 2018-11-17: 2 g via INTRAVENOUS
  Filled 2018-11-17: qty 20

## 2018-11-17 MED ORDER — HYDROCODONE-ACETAMINOPHEN 5-325 MG PO TABS
1.0000 | ORAL_TABLET | ORAL | Status: DC | PRN
  Administered 2018-11-17: 2 via ORAL
  Administered 2018-11-19: 1 via ORAL
  Administered 2018-11-19: 2 via ORAL
  Filled 2018-11-17 (×3): qty 2

## 2018-11-17 MED ORDER — OXYCODONE HCL 5 MG PO TABS
5.0000 mg | ORAL_TABLET | Freq: Three times a day (TID) | ORAL | Status: DC | PRN
  Administered 2018-11-18 – 2018-11-21 (×5): 5 mg via ORAL
  Filled 2018-11-17 (×5): qty 1

## 2018-11-17 MED ORDER — DULOXETINE HCL 60 MG PO CPEP
60.0000 mg | ORAL_CAPSULE | Freq: Two times a day (BID) | ORAL | Status: DC
  Administered 2018-11-17 – 2018-11-22 (×9): 60 mg via ORAL
  Filled 2018-11-17 (×9): qty 1

## 2018-11-17 MED ORDER — OXYCODONE-ACETAMINOPHEN 10-325 MG PO TABS: 1.0000 | ORAL_TABLET | Freq: Three times a day (TID) | ORAL | Status: DC | PRN

## 2018-11-17 MED ORDER — BACLOFEN 20 MG PO TABS
40.0000 mg | ORAL_TABLET | Freq: Two times a day (BID) | ORAL | Status: DC
  Administered 2018-11-17 – 2018-11-22 (×9): 40 mg via ORAL
  Filled 2018-11-17 (×9): qty 2

## 2018-11-17 MED ORDER — ALBUTEROL SULFATE (2.5 MG/3ML) 0.083% IN NEBU: 2.5000 mg | INHALATION_SOLUTION | RESPIRATORY_TRACT | Status: DC | PRN

## 2018-11-17 MED ORDER — SODIUM CHLORIDE 0.9 % IV SOLN
INTRAVENOUS | Status: AC
  Administered 2018-11-17: 17:00:00 via INTRAVENOUS

## 2018-11-17 MED ORDER — ONDANSETRON HCL 4 MG PO TABS: 4.0000 mg | ORAL_TABLET | Freq: Four times a day (QID) | ORAL | Status: DC | PRN

## 2018-11-17 MED ORDER — SODIUM CHLORIDE 0.9 % IV SOLN
1.0000 g | Freq: Three times a day (TID) | INTRAVENOUS | Status: DC
  Filled 2018-11-17: qty 1

## 2018-11-17 MED ORDER — SODIUM CHLORIDE 0.9 % IV SOLN: INTRAVENOUS | Status: DC | PRN

## 2018-11-17 MED ORDER — KETOROLAC TROMETHAMINE 15 MG/ML IJ SOLN
15.0000 mg | Freq: Four times a day (QID) | INTRAMUSCULAR | Status: AC | PRN
  Administered 2018-11-18 – 2018-11-21 (×8): 15 mg via INTRAVENOUS
  Filled 2018-11-17 (×8): qty 1

## 2018-11-17 MED ORDER — OXYCODONE ER 36 MG PO C12A: 36.0000 mg | EXTENDED_RELEASE_CAPSULE | Freq: Two times a day (BID) | ORAL | Status: DC

## 2018-11-17 MED ORDER — SILVER SULFADIAZINE 1 % EX CREA
1.0000 "application " | TOPICAL_CREAM | Freq: Every day | CUTANEOUS | Status: DC
  Administered 2018-11-17 – 2018-11-18 (×2): 1 via TOPICAL
  Filled 2018-11-17: qty 85

## 2018-11-17 MED ORDER — FERROUS SULFATE 325 (65 FE) MG PO TABS
325.0000 mg | ORAL_TABLET | Freq: Two times a day (BID) | ORAL | Status: DC
  Administered 2018-11-17 – 2018-11-22 (×9): 325 mg via ORAL
  Filled 2018-11-17 (×9): qty 1

## 2018-11-17 MED ORDER — SODIUM CHLORIDE 0.9 % IV BOLUS (SEPSIS)
1000.0000 mL | Freq: Once | INTRAVENOUS | Status: AC
  Administered 2018-11-17: 1000 mL via INTRAVENOUS

## 2018-11-17 MED ORDER — OXYCODONE HCL ER 15 MG PO T12A
40.0000 mg | EXTENDED_RELEASE_TABLET | Freq: Two times a day (BID) | ORAL | Status: DC
  Administered 2018-11-17 – 2018-11-22 (×9): 40 mg via ORAL
  Filled 2018-11-17 (×10): qty 2

## 2018-11-17 MED ORDER — POLYETHYLENE GLYCOL 3350 17 G PO PACK: 17.0000 g | PACK | Freq: Every day | ORAL | Status: DC | PRN

## 2018-11-17 MED ORDER — HEPARIN SODIUM (PORCINE) 5000 UNIT/ML IJ SOLN
5000.0000 [IU] | Freq: Three times a day (TID) | INTRAMUSCULAR | Status: DC
  Administered 2018-11-17 – 2018-11-22 (×10): 5000 [IU] via SUBCUTANEOUS
  Filled 2018-11-17 (×13): qty 1

## 2018-11-17 MED ORDER — IBUPROFEN 600 MG PO TABS
600.0000 mg | ORAL_TABLET | Freq: Four times a day (QID) | ORAL | Status: DC | PRN
  Filled 2018-11-17: qty 1

## 2018-11-17 MED ORDER — SENNOSIDES-DOCUSATE SODIUM 8.6-50 MG PO TABS: 1.0000 | ORAL_TABLET | Freq: Every evening | ORAL | Status: DC | PRN

## 2018-11-17 MED ORDER — MAGNESIUM CITRATE PO SOLN: 1.0000 | Freq: Once | ORAL | Status: DC | PRN

## 2018-11-17 MED ORDER — ONDANSETRON HCL 4 MG/2ML IJ SOLN
4.0000 mg | Freq: Four times a day (QID) | INTRAMUSCULAR | Status: DC | PRN
  Administered 2018-11-19: 4 mg via INTRAVENOUS
  Filled 2018-11-17: qty 2

## 2018-11-17 MED ORDER — FOLIC ACID 1 MG PO TABS
1.0000 mg | ORAL_TABLET | Freq: Every day | ORAL | Status: DC
  Administered 2018-11-17 – 2018-11-22 (×6): 1 mg via ORAL
  Filled 2018-11-17 (×6): qty 1

## 2018-11-17 MED ORDER — ACETAMINOPHEN 325 MG PO TABS
650.0000 mg | ORAL_TABLET | Freq: Four times a day (QID) | ORAL | Status: DC | PRN
  Administered 2018-11-20: 650 mg via ORAL
  Filled 2018-11-17: qty 2

## 2018-11-17 MED ORDER — OXYCODONE-ACETAMINOPHEN 5-325 MG PO TABS
1.0000 | ORAL_TABLET | Freq: Three times a day (TID) | ORAL | Status: DC | PRN
  Administered 2018-11-18 – 2018-11-20 (×3): 1 via ORAL
  Filled 2018-11-17 (×3): qty 1

## 2018-11-17 MED ORDER — OXYBUTYNIN CHLORIDE 5 MG PO TABS
5.0000 mg | ORAL_TABLET | Freq: Three times a day (TID) | ORAL | Status: DC
  Administered 2018-11-17 – 2018-11-22 (×14): 5 mg via ORAL
  Filled 2018-11-17 (×14): qty 1

## 2018-11-17 MED ORDER — LACTATED RINGERS IV SOLN
INTRAVENOUS | Status: DC
  Administered 2018-11-17: 1000 mL via INTRAVENOUS
  Administered 2018-11-18 – 2018-11-20 (×5): via INTRAVENOUS
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
## 2018-11-17 NOTE — ED Triage Notes (Signed)
To room via EMS from home.  Onset several days abd pain, worse yesterday than today.  Pt has FC and stated urine was bloody yesterday, dark yellow today.  States "head feels funny".  Pt just changed FC yesterday.  No chills, fever, N/V/D, or cold/cough symptoms.  Pt has open areas on right side of chest, on right upper back, left upper back and back of left arm.  No bleeding.  Pt states it has been there several years.  Wound vac to left hip.

## 2018-11-17 NOTE — ED Notes (Signed)
Attempted IV access x 3, no success.  Blood cultures x 1 and other labs obtained.  MD aware and will assess for U/S IV.

## 2018-11-17 NOTE — Progress Notes (Signed)
Pharmacy Antibiotic Note  Sean Horton is a 32 y.o. male admitted on 11/17/2018 with abdominal pain with bloody and dark yellow urine.  Pharmacy has been consulted for cefepime dosing for UTI.  First dose of cefepime is already ordered.  Noted patient is paraplegic.  SCr 1.24, CrCL 107 ml/min, WBC WNL, LA 1.6.   Plan: Cefepime 1gm IV Q8H Monitor renal fxn, clinical progress   Weight: 199 lb 15.3 oz (90.7 kg)  No data recorded.  Recent Labs  Lab 11/17/18 1142 11/17/18 1208  WBC 10.4  --   CREATININE 1.24  --   LATICACIDVEN  --  1.60    Estimated Creatinine Clearance: 106.8 mL/min (by C-G formula based on SCr of 1.24 mg/dL).    No Known Allergies   Cefepime 12/22 >>  12/22 BCx -   Jaylyn Iyer D. Mina Marble, PharmD, BCPS, [place] 11/17/2018, 1:02 PM

## 2018-11-17 NOTE — Progress Notes (Signed)
Patient trasfered from ED to [phone] via stretcher; alert and oriented x 4; complaints of generalized pain; IV saline locked in LFA and LAC; fluids were started in LFA per order; wound vac on left hip - dressing reinforced; pressure ulcer on left lateral ankle - stg 2 healing; escoriation on right chest, left and right posterior shoulder, left arm back, RUE. Orient patient to room and unit; gave patient care guide; instructed how to use the call bell and fall risk precautions. Will continue to monitor the patient.

## 2018-11-18 DIAGNOSIS — G8220 Paraplegia, unspecified: Secondary | ICD-10-CM

## 2018-11-18 DIAGNOSIS — N3289 Other specified disorders of bladder: Secondary | ICD-10-CM

## 2018-11-18 DIAGNOSIS — I9589 Other hypotension: Secondary | ICD-10-CM

## 2018-11-18 DIAGNOSIS — E861 Hypovolemia: Secondary | ICD-10-CM

## 2018-11-18 DIAGNOSIS — D638 Anemia in other chronic diseases classified elsewhere: Secondary | ICD-10-CM | POA: Diagnosis present

## 2018-11-18 DIAGNOSIS — Z960 Presence of urogenital implants: Secondary | ICD-10-CM

## 2018-11-18 DIAGNOSIS — R319 Hematuria, unspecified: Secondary | ICD-10-CM | POA: Diagnosis present

## 2018-11-18 DIAGNOSIS — L98492 Non-pressure chronic ulcer of skin of other sites with fat layer exposed: Secondary | ICD-10-CM

## 2018-11-18 LAB — BASIC METABOLIC PANEL
Anion gap: 6 (ref 5–15)
BUN: 47 mg/dL — ABNORMAL HIGH (ref 6–20)
CO2: 24 mmol/L (ref 22–32)
Calcium: 8.3 mg/dL — ABNORMAL LOW (ref 8.9–10.3)
Chloride: 109 mmol/L (ref 98–111)
Creatinine, Ser: 1.21 mg/dL (ref 0.61–1.24)
Glucose, Bld: 132 mg/dL — ABNORMAL HIGH (ref 70–99)
Potassium: 4.8 mmol/L (ref 3.5–5.1)
Sodium: 139 mmol/L (ref 135–145)

## 2018-11-18 LAB — CBC
HCT: 24 % — ABNORMAL LOW (ref 39.0–52.0)
Hemoglobin: 6.8 g/dL — CL (ref 13.0–17.0)
MCH: 19.8 pg — ABNORMAL LOW (ref 26.0–34.0)
MCHC: 28.3 g/dL — ABNORMAL LOW (ref 30.0–36.0)
MCV: 70 fL — ABNORMAL LOW (ref 80.0–100.0)
Platelets: 417 10*3/uL — ABNORMAL HIGH (ref 150–400)
RBC: 3.43 MIL/uL — ABNORMAL LOW (ref 4.22–5.81)
RDW: 18.5 % — ABNORMAL HIGH (ref 11.5–15.5)
WBC: 7.3 10*3/uL (ref 4.0–10.5)
nRBC: 0 % (ref 0.0–0.2)

## 2018-11-18 LAB — PREPARE RBC (CROSSMATCH)

## 2018-11-18 LAB — TROPONIN I

## 2018-11-18 LAB — HEMOGLOBIN AND HEMATOCRIT, BLOOD
HCT: 31.9 % — ABNORMAL LOW (ref 39.0–52.0)
Hemoglobin: 9.5 g/dL — ABNORMAL LOW (ref 13.0–17.0)

## 2018-11-18 LAB — GLUCOSE, CAPILLARY: Glucose-Capillary: 98 mg/dL (ref 70–99)

## 2018-11-18 MED ORDER — SODIUM CHLORIDE 0.9 % IV SOLN
1.0000 g | INTRAVENOUS | Status: DC
  Administered 2018-11-18 – 2018-11-21 (×4): 1 g via INTRAVENOUS
  Filled 2018-11-18 (×4): qty 10

## 2018-11-18 MED ORDER — SODIUM CHLORIDE 0.9 % IV BOLUS
1000.0000 mL | Freq: Once | INTRAVENOUS | Status: AC
  Administered 2018-11-18: 1000 mL via INTRAVENOUS

## 2018-11-18 MED ORDER — SILVER SULFADIAZINE 1 % EX CREA
TOPICAL_CREAM | Freq: Two times a day (BID) | CUTANEOUS | Status: DC
  Administered 2018-11-18 – 2018-11-20 (×5): via TOPICAL
  Administered 2018-11-20: 1 via TOPICAL
  Administered 2018-11-21 – 2018-11-22 (×3): via TOPICAL
  Filled 2018-11-18: qty 85

## 2018-11-18 MED ORDER — SODIUM CHLORIDE 0.9% IV SOLUTION: Freq: Once | INTRAVENOUS | Status: DC

## 2018-11-18 NOTE — Progress Notes (Signed)
PT Cancellation Note  Patient Details Name: Sean Horton MRN: [id number] DOB: Aug 05, 1986   Cancelled Treatment:    Reason Eval/Treat Not Completed: PT screened, no needs identified, will sign off PT speaking with OT - no needs idenitifed from either discipline. Reports he transfers with Mod I. Will sign off.    Lanney Gins, PT, DPT Supplemental Physical Therapist 11/18/18 12:48 PM Pager: [phone] Office: [phone]

## 2018-11-18 NOTE — Progress Notes (Signed)
CRITICAL VALUE ALERT  Critical Value:  Hgb 6.8  Date & Time Notied:  11/18/18 at 0634  Provider Notified: Bodenheimer,NP  Orders Received/Actions taken: No new orders at this time.

## 2018-11-18 NOTE — Consult Note (Addendum)
[facility] Nurse wound consult note Reason for Consult:Chronic nonhealing wound to left lateral arm above and below elbow. Dry and crusted with nonintact center.   Full thickness, healing pressure injury, stage 3 to left lateral malleolus Stage 4 nonhealing pressure injury to left trochanter, history osteomyelitis. Patients home NPWT (VAC) device is in place.  This is removed and plugged in to charge at the head of the bed and can be re-connected at discharge.   Wound type:pressure injury Pressure Injury POA: Yes Measurement:Left arm:  Distal:  3 cmx 2 cm x 0.1 cm nonintact Proximal:  2 cm x 4 cm x 0.1 cm  Left trochanter:  7 cm x 4 cm x 6.5 cm (tunnels from 9 - 1 o'clock)  Left lateral malleolus:  2 cm x 2 cm x 0.1 cm  Wound ENI:DPOE pink nongranulating Drainage (amount, consistency, odor) moderate serosanguinous to trochanter.  All others dry with scant drainage. Musty odor to left trochanter.  Periwound:Scarring to trochanter Dry, crusted skin to arm wounds.  Dressing procedure/placement/frequency: Cleanse wounds to left arm and left malleolus with NS and pat dry.  Apply silvadene cream twice daily. Cover with dry dressing.  Wound VAc to left hip.  Bridged to thigh, away from bony prominence. Change M/W/F Will not follow at this time.  Please re-consult if needed.  Domenic Moras MSN, RN, FNP-BC CWON Wound, Ostomy, Continence Nurse Pager [phone]

## 2018-11-18 NOTE — Progress Notes (Signed)
PT Cancellation Note  Patient Details Name: Sean Horton MRN: [id number] DOB: [birth year]   Cancelled Treatment:    Reason Eval/Treat Not Completed: Patient not medically ready Noted Hgb 6.8. Will follow-up as time allows.   Lanney Gins, PT, DPT Supplemental Physical Therapist 11/18/18 8:02 AM Pager: [phone] Office: [phone]

## 2018-11-18 NOTE — Plan of Care (Signed)
  Problem: Education: Goal: Knowledge of General Education information will improve Description: Including pain rating scale, medication(s)/side effects and non-pharmacologic comfort measures Outcome: Progressing   Problem: Health Behavior/Discharge Planning: Goal: Ability to manage health-related needs will improve Outcome: Progressing   Problem: Clinical Measurements: Goal: Ability to maintain clinical measurements within normal limits will improve Outcome: Progressing Goal: Will remain free from infection Outcome: Progressing Goal: Diagnostic test results will improve Outcome: Progressing Goal: Respiratory complications will improve Outcome: Progressing Goal: Cardiovascular complication will be avoided Outcome: Progressing   Problem: Activity: Goal: Risk for activity intolerance will decrease Outcome: Progressing   Problem: Nutrition: Goal: Adequate nutrition will be maintained Outcome: Progressing   Problem: Coping: Goal: Level of anxiety will decrease Outcome: Progressing   Problem: Skin Integrity: Goal: Risk for impaired skin integrity will decrease Outcome: Progressing   
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
## 2018-11-18 NOTE — Progress Notes (Signed)
OT Cancellation Note  Patient Details Name: Sean Horton MRN: [id number] DOB: 1985/12/18   Cancelled Treatment:    Reason Eval/Treat Not Completed: OT screened, no needs identified, will sign off.  Spoke with pt and he doesn't feel he needs acute OT or PT.  He reports he is able to transfer mod I, and has all DME.  Will sign off.  Lucille Passy, OTR/L Acute Rehabilitation Services Pager [phone] Office [phone]   Lucille Passy M 11/18/2018, 12:41 PM

## 2018-11-19 DIAGNOSIS — N319 Neuromuscular dysfunction of bladder, unspecified: Secondary | ICD-10-CM

## 2018-11-19 DIAGNOSIS — D638 Anemia in other chronic diseases classified elsewhere: Secondary | ICD-10-CM

## 2018-11-19 DIAGNOSIS — R319 Hematuria, unspecified: Secondary | ICD-10-CM

## 2018-11-19 LAB — TYPE AND SCREEN
ABO/RH(D): O POS
Antibody Screen: NEGATIVE
Unit division: 0
Unit division: 0

## 2018-11-19 LAB — CBC
HCT: 30.6 % — ABNORMAL LOW (ref 39.0–52.0)
Hemoglobin: 8.7 g/dL — ABNORMAL LOW (ref 13.0–17.0)
MCH: 20.5 pg — ABNORMAL LOW (ref 26.0–34.0)
MCHC: 28.4 g/dL — ABNORMAL LOW (ref 30.0–36.0)
MCV: 72 fL — ABNORMAL LOW (ref 80.0–100.0)
PLATELETS: 427 10*3/uL — AB (ref 150–400)
RBC: 4.25 MIL/uL (ref 4.22–5.81)
RDW: 20.4 % — ABNORMAL HIGH (ref 11.5–15.5)
WBC: 8.8 10*3/uL (ref 4.0–10.5)
nRBC: 0 % (ref 0.0–0.2)

## 2018-11-19 LAB — BASIC METABOLIC PANEL
Anion gap: 7 (ref 5–15)
BUN: 40 mg/dL — ABNORMAL HIGH (ref 6–20)
CO2: 23 mmol/L (ref 22–32)
Calcium: 8.9 mg/dL (ref 8.9–10.3)
Chloride: 109 mmol/L (ref 98–111)
Creatinine, Ser: 1.23 mg/dL (ref 0.61–1.24)
GFR calc Af Amer: >60 mL/min (ref >60)
Glucose, Bld: 122 mg/dL — ABNORMAL HIGH (ref 70–99)
Potassium: 4.6 mmol/L (ref 3.5–5.1)
Sodium: 139 mmol/L (ref 135–145)

## 2018-11-19 LAB — BPAM RBC
Blood Product Expiration Date: 202001162359
Blood Product Expiration Date: 202001162359
ISSUE DATE / TIME: 201912231019
ISSUE DATE / TIME: 201912231343
Unit Type and Rh: 5100
Unit Type and Rh: 5100

## 2018-11-19 LAB — URINE CULTURE: Culture: <10000 — AB
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
## 2018-11-19 NOTE — Progress Notes (Signed)
Gave pain meds that are ordered for patient throughout the night.  Pt. Stating that its not controlling his pain.

## 2018-11-20 ENCOUNTER — Inpatient Hospital Stay: Payer: Self-pay

## 2018-11-20 ENCOUNTER — Inpatient Hospital Stay (HOSPITAL_COMMUNITY): Payer: Medicaid Other

## 2018-11-20 LAB — COMPREHENSIVE METABOLIC PANEL
ALT: 13 U/L (ref 0–44)
ALT: 10 U/L (ref 0–44)
ANION GAP: 11 (ref 5–15)
AST: 39 U/L (ref 15–41)
AST: 29 U/L (ref 15–41)
Albumin: 2.5 g/dL — ABNORMAL LOW (ref 3.5–5.0)
Albumin: 2.1 g/dL — ABNORMAL LOW (ref 3.5–5.0)
Alkaline Phosphatase: 131 U/L — ABNORMAL HIGH (ref 38–126)
Alkaline Phosphatase: 109 U/L (ref 38–126)
Anion gap: 9 (ref 5–15)
BUN: 22 mg/dL — ABNORMAL HIGH (ref 6–20)
BUN: 21 mg/dL — ABNORMAL HIGH (ref 6–20)
CO2: 18 mmol/L — AB (ref 22–32)
CO2: 21 mmol/L — AB (ref 22–32)
Calcium: 9.1 mg/dL (ref 8.9–10.3)
Calcium: 8.7 mg/dL — ABNORMAL LOW (ref 8.9–10.3)
Chloride: 110 mmol/L (ref 98–111)
Chloride: 110 mmol/L (ref 98–111)
Creatinine, Ser: 1.22 mg/dL (ref 0.61–1.24)
Creatinine, Ser: 1.24 mg/dL (ref 0.61–1.24)
GFR calc Af Amer: >60 mL/min (ref >60)
GFR calc Af Amer: >60 mL/min (ref >60)
GFR calc non Af Amer: >60 mL/min (ref >60)
GFR calc non Af Amer: >60 mL/min (ref >60)
GLUCOSE: 135 mg/dL — AB (ref 70–99)
Glucose, Bld: 95 mg/dL (ref 70–99)
Potassium: 5.4 mmol/L — ABNORMAL HIGH (ref 3.5–5.1)
Potassium: 4.5 mmol/L (ref 3.5–5.1)
SODIUM: 139 mmol/L (ref 135–145)
Sodium: 140 mmol/L (ref 135–145)
Total Bilirubin: 0.8 mg/dL (ref 0.3–1.2)
Total Bilirubin: 0.6 mg/dL (ref 0.3–1.2)
Total Protein: 9.2 g/dL — ABNORMAL HIGH (ref 6.5–8.1)
Total Protein: 8 g/dL (ref 6.5–8.1)

## 2018-11-20 LAB — CBC
HCT: 41.3 % (ref 39.0–52.0)
Hemoglobin: 12.4 g/dL — ABNORMAL LOW (ref 13.0–17.0)
MCH: 21.3 pg — ABNORMAL LOW (ref 26.0–34.0)
MCHC: 30 g/dL (ref 30.0–36.0)
MCV: 71.1 fL — ABNORMAL LOW (ref 80.0–100.0)
Platelets: 418 10*3/uL — ABNORMAL HIGH (ref 150–400)
RBC: 5.81 MIL/uL (ref 4.22–5.81)
RDW: 22.9 % — ABNORMAL HIGH (ref 11.5–15.5)
WBC: 18.5 10*3/uL — AB (ref 4.0–10.5)
nRBC: 0.2 % (ref 0.0–0.2)

## 2018-11-20 LAB — BLOOD GAS, ARTERIAL
Acid-base deficit: 3.8 mmol/L — ABNORMAL HIGH (ref 0.0–2.0)
Bicarbonate: 19.7 mmol/L — ABNORMAL LOW (ref 20.0–28.0)
Drawn by: 280981
FIO2: 21
O2 Saturation: 92.2 %
Patient temperature: 98.6
pCO2 arterial: 29.4 mmHg — ABNORMAL LOW (ref 32.0–48.0)
pH, Arterial: 7.44 (ref 7.350–7.450)
pO2, Arterial: 61.7 mmHg — ABNORMAL LOW (ref 83.0–108.0)

## 2018-11-20 LAB — RAPID URINE DRUG SCREEN, HOSP PERFORMED
Amphetamines: NOT DETECTED
Barbiturates: NOT DETECTED
Benzodiazepines: NOT DETECTED
COCAINE: NOT DETECTED
Opiates: POSITIVE — AB
TETRAHYDROCANNABINOL: NOT DETECTED

## 2018-11-20 LAB — AMMONIA: Ammonia: 34 umol/L (ref 9–35)

## 2018-11-20 MED ORDER — LORAZEPAM 2 MG/ML IJ SOLN
INTRAMUSCULAR | Status: AC
  Filled 2018-11-20: qty 1

## 2018-11-20 MED ORDER — SODIUM CHLORIDE 0.9 % IV SOLN
INTRAVENOUS | Status: DC
  Administered 2018-11-20 – 2018-11-21 (×3): via INTRAVENOUS

## 2018-11-20 MED ORDER — SODIUM CHLORIDE 0.9 % IV SOLN
INTRAVENOUS | Status: DC
  Administered 2018-11-20: 18:00:00 via INTRAVENOUS

## 2018-11-20 MED ORDER — BISACODYL 10 MG RE SUPP
10.0000 mg | Freq: Once | RECTAL | Status: AC
  Administered 2018-11-20: 10 mg via RECTAL
  Filled 2018-11-20: qty 1

## 2018-11-20 MED ORDER — POLYETHYLENE GLYCOL 3350 17 G PO PACK
17.0000 g | PACK | Freq: Two times a day (BID) | ORAL | Status: DC
  Administered 2018-11-21 – 2018-11-22 (×3): 17 g via ORAL
  Filled 2018-11-20 (×3): qty 1

## 2018-11-20 MED ORDER — SENNOSIDES-DOCUSATE SODIUM 8.6-50 MG PO TABS
2.0000 | ORAL_TABLET | Freq: Every day | ORAL | Status: DC
  Administered 2018-11-21: 2 via ORAL
  Filled 2018-11-20: qty 2

## 2018-11-20 MED ORDER — LORAZEPAM 2 MG/ML IJ SOLN
1.0000 mg | Freq: Once | INTRAMUSCULAR | Status: AC
  Administered 2018-11-20: 1 mg via INTRAVENOUS
  Filled 2018-11-20: qty 1

## 2018-11-20 MED ORDER — SODIUM CHLORIDE 0.9 % IV BOLUS
500.0000 mL | Freq: Once | INTRAVENOUS | Status: AC
  Administered 2018-11-20: 500 mL via INTRAVENOUS

## 2018-11-20 MED ORDER — PREGABALIN 100 MG PO CAPS
300.0000 mg | ORAL_CAPSULE | Freq: Two times a day (BID) | ORAL | Status: DC
  Administered 2018-11-20 – 2018-11-22 (×4): 300 mg via ORAL
  Filled 2018-11-20 (×4): qty 3

## 2018-11-20 MED ORDER — PANTOPRAZOLE SODIUM 40 MG PO TBEC
40.0000 mg | DELAYED_RELEASE_TABLET | Freq: Once | ORAL | Status: AC
  Administered 2018-11-20: 40 mg via ORAL
  Filled 2018-11-20: qty 1

## 2018-11-20 NOTE — Progress Notes (Addendum)
Patient extremely uncomfortable. States his stomach is constantly tight and wont let up. Rates pain 10/10 with headache. I can visualize spasm over abdomen. BP 215/122. Notified MD and orders received.

## 2018-11-20 NOTE — Significant Event (Signed)
Rapid Response Event Note  Overview: Time Called: 1427 Arrival Time: 1430 Event Type: Neurologic, Cardiac  Initial Focused Assessment: Patient with complains of abdominal discomfort since this morning.  Lost IV access because of restlessness.  RN called because patient normally alert and oriented, now minimally responsive and HR  180.   Patient minimally responsive Bp 147/101  Hr 107  RR 22  O2 sat 97% RA Abdominal spasms 12 lead EKG done  Interventions: Foley emptied: about 5L urine PCXR  ABG done Labs attempted x 4 BP decreased to 77/42  ST 120  IV start attempted.  IV team consulted. Hand off with Magda Paganini  RRT RN  Plan of Care (if not transferred):  Event Summary: Name of Physician Notified: [facility] at 1427    at    [facility]al[facility] Ip

## 2018-11-20 NOTE — Progress Notes (Signed)
Around 1500 Charge RN came to ask me to look at patient to assess if there had been a change in his status. When I approached the room he was lethargic.  Around 1300 he was A&Ox4 and continued to c/o of abdominal pain.   Called rapid and Dr. Denton Brick. Started EKG. VS obtained. Orders for IV start, IV fluids, labs, imaging, UDS were started. Will continue to monitor.

## 2018-11-20 NOTE — Progress Notes (Signed)
[facility] RN contacted unit RN regarding IVT consult. Unit RN reported pt has had a change in mental status and needs stat PIV for IV fluids. Advised unit RN IVT will be there as soon as possible.

## 2018-11-20 NOTE — Progress Notes (Signed)
Spoke with Margaretmary Bayley RN re PICC order.  PICC to be placed in AM, RN to obtain consent from mother due to pt AMS this shift.

## 2018-11-20 NOTE — Progress Notes (Signed)
Patient for dc today via ambulance, please contact [facility] at [phone] when patient is ready for transport,  NCM confirmed address, ambulance forms are on the chart.  NCM informed RN to contact ambulance when patient ready,  Patient is having some abd discomfort so not ready yet.
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
## 2018-11-21 ENCOUNTER — Inpatient Hospital Stay (HOSPITAL_COMMUNITY): Payer: Medicaid Other

## 2018-11-21 DIAGNOSIS — R4182 Altered mental status, unspecified: Secondary | ICD-10-CM

## 2018-11-21 LAB — CBC
HCT: 33.3 % — ABNORMAL LOW (ref 39.0–52.0)
Hemoglobin: 9.8 g/dL — ABNORMAL LOW (ref 13.0–17.0)
MCH: 21 pg — AB (ref 26.0–34.0)
MCHC: 29.4 g/dL — ABNORMAL LOW (ref 30.0–36.0)
MCV: 71.5 fL — AB (ref 80.0–100.0)
Platelets: 456 10*3/uL — ABNORMAL HIGH (ref 150–400)
RBC: 4.66 MIL/uL (ref 4.22–5.81)
RDW: 22.5 % — ABNORMAL HIGH (ref 11.5–15.5)
WBC: 14 10*3/uL — ABNORMAL HIGH (ref 4.0–10.5)
nRBC: 0.1 % (ref 0.0–0.2)

## 2018-11-21 LAB — GLUCOSE, CAPILLARY: GLUCOSE-CAPILLARY: 95 mg/dL (ref 70–99)

## 2018-11-21 MED ORDER — SODIUM CHLORIDE 0.9% FLUSH: 10.0000 mL | INTRAVENOUS | Status: DC | PRN

## 2018-11-21 NOTE — Procedures (Signed)
History: 32 year old male being evaluated for intermittent episodes of agitation  Sedation: None  Technique: This is a 21 channel routine scalp EEG performed at the bedside with bipolar and monopolar montages arranged in accordance to the international 10/20 system of electrode placement. One channel was dedicated to EKG recording.    Background: The background consists of intermixed alpha and beta activities. There is a well defined posterior dominant rhythm of 8 Hz that attenuates with eye opening.  During drowsiness, there are frequent posterior discharges characteristic of positive occipital transients of sleep(POSTs).  Atypically, with 2 of these discharges there is a initial negative sharply contoured deflection, followed by subsequent positive wave.  The distribution is bioccipital and very similar to the other discharges which are much more consistent with posts.  These are not seen during wakefulness.  Photic stimulation: Physiologic driving is not performed  EEG Abnormalities: 1) borderline abnormality representing a possible poorly localized occipital area of irritability  Clinical Interpretation: I feel that this EEG most likely represents a normal variant.  Given the atypical appearance of two of the posterior discharges amid others that appear typical for POSTS, the possibility of an occipital area of irritability is not completely excluded, however I feel this is less likely than that this represents a normal variant.  If there continues to be a concern for paroxysmal episodes, then continuous EEG could be considered to capture one of these events.   Roland Rack, MD [facility] [phone]  If 7pm- 7am, please page neurology on call as listed in [place].
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
## 2018-11-21 NOTE — Progress Notes (Signed)
Peripherally Inserted Central Catheter/Midline Placement  The IV Nurse has discussed with the patient and/or persons authorized to consent for the patient, the purpose of this procedure and the potential benefits and risks involved with this procedure.  The benefits include less needle sticks, lab draws from the catheter, and the patient may be discharged home with the catheter. Risks include, but not limited to, infection, bleeding, blood clot (thrombus formation), and puncture of an artery; nerve damage and irregular heartbeat and possibility to perform a PICC exchange if needed/ordered by physician.  Alternatives to this procedure were also discussed.  B[employer] Power PICC patient education guide, fact sheet on infection prevention and patient information card has been provided to patient /or left at bedside.    PICC/Midline Placement Documentation  PICC Double Lumen 07/24/18 PICC Right Brachial 48 cm 1 cm (Active)   Consent signed by mother, patient also agrees with picc placement.   Holley Bouche Renee 11/21/2018, 8:44 AM

## 2018-11-21 NOTE — Progress Notes (Signed)
EEG completed; results pending.    

## 2018-11-22 LAB — CULTURE, BLOOD (ROUTINE X 2)
CULTURE: NO GROWTH
Culture: NO GROWTH
Special Requests: ADEQUATE
Special Requests: ADEQUATE

## 2018-11-22 LAB — CBC
HCT: 31.8 % — ABNORMAL LOW (ref 39.0–52.0)
Hemoglobin: 9.1 g/dL — ABNORMAL LOW (ref 13.0–17.0)
MCH: 21.6 pg — ABNORMAL LOW (ref 26.0–34.0)
MCHC: 28.6 g/dL — ABNORMAL LOW (ref 30.0–36.0)
MCV: 75.5 fL — ABNORMAL LOW (ref 80.0–100.0)
Platelets: 403 10*3/uL — ABNORMAL HIGH (ref 150–400)
RBC: 4.21 MIL/uL — AB (ref 4.22–5.81)
RDW: 23.6 % — ABNORMAL HIGH (ref 11.5–15.5)
WBC: 9.3 10*3/uL (ref 4.0–10.5)
nRBC: 0.3 % — ABNORMAL HIGH (ref 0.0–0.2)

## 2018-11-22 LAB — GLUCOSE, CAPILLARY
Glucose-Capillary: 98 mg/dL (ref 70–99)
Glucose-Capillary: 101 mg/dL — ABNORMAL HIGH (ref 70–99)

## 2018-11-22 MED ORDER — ACETAMINOPHEN 325 MG PO TABS: 650.0000 mg | ORAL_TABLET | Freq: Four times a day (QID) | ORAL | Status: DC | PRN

## 2018-11-22 MED ORDER — LACTULOSE 10 GM/15ML PO SOLN
30.0000 g | Freq: Once | ORAL | Status: DC
  Filled 2018-11-22: qty 45

## 2018-11-22 MED ORDER — POLYETHYLENE GLYCOL 3350 17 G PO PACK: 17.0000 g | PACK | Freq: Two times a day (BID) | ORAL | 2 refills | Status: DC

## 2018-11-22 MED ORDER — SENNA 8.6 MG PO TABS: 2.0000 | ORAL_TABLET | Freq: Every day | ORAL | 4 refills | Status: DC

## 2018-11-22 MED ORDER — BISACODYL 10 MG RE SUPP
10.0000 mg | Freq: Once | RECTAL | Status: AC
  Administered 2018-11-22: 10 mg via RECTAL
  Filled 2018-11-22: qty 1

## 2018-11-22 MED ORDER — LACTULOSE 10 GM/15ML PO SOLN
60.0000 g | Freq: Once | ORAL | Status: AC
  Administered 2018-11-22: 60 g via ORAL
  Filled 2018-11-22: qty 90

## 2018-11-22 NOTE — Discharge Instructions (Signed)
1) you have severe constipation----please take laxatives as prescribed 2) medial wound VAC change every Monday Wednesdays and Fridays 3) continue to change her suprapubic urine catheter as advised
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
## 2018-11-22 NOTE — Progress Notes (Signed)
Patient alert and oriented x 4, stable condition upon discharge with PTAR.

## 2018-11-22 NOTE — Care Management Note (Signed)
Case Management Note  Patient Details  Name: Sean Horton MRN: [id number] Date of Birth: 12/20/1985  Subjective/Objective:                    Action/Plan: Pt discharging home with resumption of [facility] services. CM notified Jermaine with [facility] of patients discharge today. Pt had VAC dressing last changed on 12/27. Jermaine made aware. Pt has home VAC at the bedside.  Pt to have PTAR transport home. Pt's bedside RN to call and arrange transport when he is medically ready. Transport form on the chart.  Expected Discharge Date:  11/22/18               Expected Discharge Plan:  [place]  In-House Referral:  NA  Discharge planning Services  CM Consult  Post Acute Care Choice:  Home Health Choice offered to:  Patient  DME Arranged:  N/A DME Agency:  NA  HH Arranged:  RN [place] Agency:  [place]  Status of Service:  Completed, signed off  If discussed at [place] of Stay Meetings, dates discussed:    Additional Comments:  Pollie Friar, RN 11/22/2018, 4:07 PM

## 2018-11-22 NOTE — Progress Notes (Signed)
Wound vac changed to home vac. Hospital wound vac placed at nurses station for portable to pick up. Discharge teaching complete. Meds, diet, activity and follow up appointments reviewed and all questions answered. Prescriptions sent to pharmacy.

## 2018-11-22 NOTE — Progress Notes (Signed)
Patient pick up by Ptar. Patient is alert and oriented. Vital signs are stable. All belongings gave to the patient. Iv take out by day shift nurse.

## 2018-11-25 LAB — CULTURE, BLOOD (ROUTINE X 2)
CULTURE: NO GROWTH
SPECIAL REQUESTS: ADEQUATE

## 2018-11-26 ENCOUNTER — Telehealth (INDEPENDENT_AMBULATORY_CARE_PROVIDER_SITE_OTHER): Payer: Self-pay | Admitting: Orthopedic Surgery

## 2018-11-26 NOTE — Telephone Encounter (Signed)
[name]  [phone]  Sean Horton    Verbal orders  Re-certification  for the next 60 days  Resuming care for  last left wound vac

## 2018-11-26 NOTE — Telephone Encounter (Signed)
I called and sw Margarita Grizzle to advise that the pt has not been in the office since 09/10/18 and that we need to see him in the office in order to manager his care. We are happy to see him at any time. Margarita Grizzle advised that she will make arrangements for the pt to come in and be seen.

## 2018-11-28 ENCOUNTER — Ambulatory Visit (INDEPENDENT_AMBULATORY_CARE_PROVIDER_SITE_OTHER): Payer: Medicaid Other | Admitting: Physician Assistant

## 2018-11-28 ENCOUNTER — Encounter (INDEPENDENT_AMBULATORY_CARE_PROVIDER_SITE_OTHER): Payer: Self-pay | Admitting: Physician Assistant

## 2018-11-28 VITALS — Ht 77.0 in | Wt 231.0 lb

## 2018-11-28 DIAGNOSIS — S71002S Unspecified open wound, left hip, sequela: Secondary | ICD-10-CM | POA: Diagnosis not present

## 2018-11-28 DIAGNOSIS — G8220 Paraplegia, unspecified: Secondary | ICD-10-CM

## 2018-11-28 DIAGNOSIS — L97322 Non-pressure chronic ulcer of left ankle with fat layer exposed: Secondary | ICD-10-CM | POA: Diagnosis not present
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
## 2018-11-29 ENCOUNTER — Encounter (INDEPENDENT_AMBULATORY_CARE_PROVIDER_SITE_OTHER): Payer: Self-pay | Admitting: Physician Assistant

## 2018-12-05 ENCOUNTER — Encounter: Payer: Self-pay | Admitting: Family Medicine

## 2018-12-06 ENCOUNTER — Encounter: Payer: Self-pay | Admitting: Family Medicine

## 2018-12-13 ENCOUNTER — Telehealth (INDEPENDENT_AMBULATORY_CARE_PROVIDER_SITE_OTHER): Payer: Self-pay | Admitting: Orthopedic Surgery

## 2018-12-13 ENCOUNTER — Telehealth (INDEPENDENT_AMBULATORY_CARE_PROVIDER_SITE_OTHER): Payer: Self-pay

## 2018-12-13 NOTE — Telephone Encounter (Signed)
I called and spoke to Angie at [facility] and she asked PA about using Medi-honey on patients ankle wound and saline for cleaning, VO was understood

## 2018-12-13 NOTE — Telephone Encounter (Signed)
Done

## 2018-12-13 NOTE — Telephone Encounter (Signed)
Angie from [facility] called to request VO  orwould care plan and will be going to see hm again today.  She would like to kno what you recommend that they put on it.  CB#[phone].  Thank you.

## 2018-12-16 ENCOUNTER — Telehealth (INDEPENDENT_AMBULATORY_CARE_PROVIDER_SITE_OTHER): Payer: Self-pay

## 2018-12-16 NOTE — Telephone Encounter (Signed)
Angie with [facility] would like to know if they could reuse the suction tube that was taken off.  Cb#  [phone].  Please advise.  Thank You.

## 2018-12-16 NOTE — Telephone Encounter (Signed)
sw HHN and she states that [employer] sent the wrong products for the pt's vac but that she was able to locate some and said everything is going as ordered. Will call with any other questions.

## 2018-12-23 ENCOUNTER — Telehealth (INDEPENDENT_AMBULATORY_CARE_PROVIDER_SITE_OTHER): Payer: Self-pay | Admitting: Orthopedic Surgery

## 2018-12-23 NOTE — Telephone Encounter (Signed)
Returned call to Kidron with [employer] got recording having system problem unable to process call   Please try call again later.  [phone]  Ext: [phone]

## 2018-12-26 ENCOUNTER — Ambulatory Visit (INDEPENDENT_AMBULATORY_CARE_PROVIDER_SITE_OTHER): Payer: Medicaid Other | Admitting: Physician Assistant

## 2019-01-21 ENCOUNTER — Telehealth (INDEPENDENT_AMBULATORY_CARE_PROVIDER_SITE_OTHER): Payer: Self-pay | Admitting: Orthopedic Surgery

## 2019-01-21 NOTE — Telephone Encounter (Signed)
Do you have a form on this pt from [employer]?

## 2019-01-21 NOTE — Telephone Encounter (Signed)
[employer]  [phone]  RL     NCZMA request for prior approval needed through insurance to continue wound vac therapy. The doctor needs to fill out the form 19 to 30 under medical and functional status. Please call to advise

## 2019-01-23 ENCOUNTER — Telehealth (INDEPENDENT_AMBULATORY_CARE_PROVIDER_SITE_OTHER): Payer: Self-pay | Admitting: Orthopedic Surgery

## 2019-01-23 NOTE — Telephone Encounter (Signed)
RL called back to see if we have the form for patient, I did advise her that the message was received and we will respond as soon as we receive a response from Dr.Duda.

## 2019-01-23 NOTE — Telephone Encounter (Signed)
Will fax today

## 2019-01-23 NOTE — Telephone Encounter (Signed)
Angie from [facility] called this morning in reference to the patient's wound vac.  They are wanting to d/c the wound vac and then do the normal dressing change and wound care.  CB#[phone].  Thank you.

## 2019-01-23 NOTE — Telephone Encounter (Signed)
I called and lm on vm for HHN to question what the pt needs. I have a message from [facility] requesting prior auth sheet to be completed for his wound vac to the left hip and this was filled out will fax today. But in this message they are asking to d/c the vac and do daily dressing changes? I am not sure what it is that they want to do for the pt. His last visit was 11/28/2018 and asked if he should come in for follow up with Dr. Sharol Given. To call and let me know what it is that they want to do.

## 2019-01-24 ENCOUNTER — Telehealth: Payer: Self-pay

## 2019-01-24 NOTE — Telephone Encounter (Signed)
Pt called nurse line stating his wheelchair "airmattress" seat has a hole in it. Pt states it stays inflated for about an hour and then deflates again. This is very uncomfortable for the patient and he would like an order to sent to [facility] for a new "airmatress" seat for his wheel. Please let him know when this has been done so he can track.

## 2019-01-24 NOTE — Telephone Encounter (Signed)
sw angie and she advised that they are going to d/c the vac and wash with NS apply a calcium alginate dressing and cover with foam. Changing this 2 times per week. Pt will call for follow up appt in the next few weeks. Verbal ok for dressing orders and will call with any questions.

## 2019-02-04 NOTE — Telephone Encounter (Signed)
I'm sorry for the delay, this was received while I was on vacation. Unsure how to order this for patient, typically the wheelchair company needs to do repairs to the chair and he should be able to contact them directly for a repair.

## 2019-02-12 NOTE — Telephone Encounter (Signed)
Sean Horton is on night float. I pulled the DME form from her box and had [place] sign. Faxed to appropriate number and copy made for batch scanning.

## 2019-03-04 ENCOUNTER — Encounter (HOSPITAL_COMMUNITY): Payer: Self-pay | Admitting: Physician Assistant

## 2019-03-04 ENCOUNTER — Other Ambulatory Visit: Payer: Self-pay

## 2019-03-04 ENCOUNTER — Emergency Department (HOSPITAL_COMMUNITY): Payer: Medicaid Other

## 2019-03-04 ENCOUNTER — Inpatient Hospital Stay (HOSPITAL_COMMUNITY)
Admission: EM | Admit: 2019-03-04 | Discharge: 2019-03-12 | DRG: 698 | Disposition: A | Discharge status: Home / Self Care | Payer: Medicaid Other | Attending: Internal Medicine | Admitting: Internal Medicine

## 2019-03-04 DIAGNOSIS — L89324 Pressure ulcer of left buttock, stage 4: Secondary | ICD-10-CM | POA: Diagnosis present

## 2019-03-04 DIAGNOSIS — R748 Abnormal levels of other serum enzymes: Secondary | ICD-10-CM | POA: Diagnosis present

## 2019-03-04 DIAGNOSIS — M25552 Pain in left hip: Secondary | ICD-10-CM | POA: Diagnosis present

## 2019-03-04 DIAGNOSIS — N179 Acute kidney failure, unspecified: Secondary | ICD-10-CM

## 2019-03-04 DIAGNOSIS — G909 Disorder of the autonomic nervous system, unspecified: Secondary | ICD-10-CM | POA: Diagnosis present

## 2019-03-04 DIAGNOSIS — T83518A Infection and inflammatory reaction due to other urinary catheter, initial encounter: Principal | ICD-10-CM | POA: Diagnosis present

## 2019-03-04 DIAGNOSIS — L8990 Pressure ulcer of unspecified site, unspecified stage: Secondary | ICD-10-CM

## 2019-03-04 DIAGNOSIS — G8929 Other chronic pain: Secondary | ICD-10-CM | POA: Diagnosis present

## 2019-03-04 DIAGNOSIS — F17290 Nicotine dependence, other tobacco product, uncomplicated: Secondary | ICD-10-CM | POA: Diagnosis present

## 2019-03-04 DIAGNOSIS — G901 Familial dysautonomia [Riley-Day]: Secondary | ICD-10-CM | POA: Diagnosis present

## 2019-03-04 DIAGNOSIS — G9389 Other specified disorders of brain: Secondary | ICD-10-CM | POA: Diagnosis present

## 2019-03-04 DIAGNOSIS — N170 Acute kidney failure with tubular necrosis: Secondary | ICD-10-CM | POA: Diagnosis present

## 2019-03-04 DIAGNOSIS — J939 Pneumothorax, unspecified: Secondary | ICD-10-CM

## 2019-03-04 DIAGNOSIS — R34 Anuria and oliguria: Secondary | ICD-10-CM | POA: Diagnosis present

## 2019-03-04 DIAGNOSIS — Z79891 Long term (current) use of opiate analgesic: Secondary | ICD-10-CM

## 2019-03-04 DIAGNOSIS — Z960 Presence of urogenital implants: Secondary | ICD-10-CM

## 2019-03-04 DIAGNOSIS — G9341 Metabolic encephalopathy: Secondary | ICD-10-CM | POA: Diagnosis present

## 2019-03-04 DIAGNOSIS — Z87440 Personal history of urinary (tract) infections: Secondary | ICD-10-CM

## 2019-03-04 DIAGNOSIS — E874 Mixed disorder of acid-base balance: Secondary | ICD-10-CM | POA: Diagnosis present

## 2019-03-04 DIAGNOSIS — W19XXXA Unspecified fall, initial encounter: Secondary | ICD-10-CM | POA: Diagnosis present

## 2019-03-04 DIAGNOSIS — Z978 Presence of other specified devices: Secondary | ICD-10-CM

## 2019-03-04 DIAGNOSIS — Z23 Encounter for immunization: Secondary | ICD-10-CM

## 2019-03-04 DIAGNOSIS — L89152 Pressure ulcer of sacral region, stage 2: Secondary | ICD-10-CM | POA: Diagnosis present

## 2019-03-04 DIAGNOSIS — J982 Interstitial emphysema: Secondary | ICD-10-CM

## 2019-03-04 DIAGNOSIS — Z79899 Other long term (current) drug therapy: Secondary | ICD-10-CM

## 2019-03-04 DIAGNOSIS — E875 Hyperkalemia: Secondary | ICD-10-CM | POA: Diagnosis present

## 2019-03-04 DIAGNOSIS — Y846 Urinary catheterization as the cause of abnormal reaction of the patient, or of later complication, without mention of misadventure at the time of the procedure: Secondary | ICD-10-CM | POA: Diagnosis present

## 2019-03-04 DIAGNOSIS — T796XXA Traumatic ischemia of muscle, initial encounter: Secondary | ICD-10-CM

## 2019-03-04 DIAGNOSIS — D649 Anemia, unspecified: Secondary | ICD-10-CM | POA: Diagnosis present

## 2019-03-04 DIAGNOSIS — G8220 Paraplegia, unspecified: Secondary | ICD-10-CM

## 2019-03-04 DIAGNOSIS — N319 Neuromuscular dysfunction of bladder, unspecified: Secondary | ICD-10-CM | POA: Diagnosis present

## 2019-03-04 DIAGNOSIS — R6521 Severe sepsis with septic shock: Secondary | ICD-10-CM | POA: Diagnosis present

## 2019-03-04 DIAGNOSIS — R4182 Altered mental status, unspecified: Secondary | ICD-10-CM

## 2019-03-04 DIAGNOSIS — K592 Neurogenic bowel, not elsewhere classified: Secondary | ICD-10-CM | POA: Diagnosis present

## 2019-03-04 DIAGNOSIS — B9620 Unspecified Escherichia coli [E. coli] as the cause of diseases classified elsewhere: Secondary | ICD-10-CM | POA: Diagnosis present

## 2019-03-04 DIAGNOSIS — A419 Sepsis, unspecified organism: Secondary | ICD-10-CM

## 2019-03-04 DIAGNOSIS — N390 Urinary tract infection, site not specified: Secondary | ICD-10-CM | POA: Diagnosis present

## 2019-03-04 DIAGNOSIS — I959 Hypotension, unspecified: Secondary | ICD-10-CM

## 2019-03-04 DIAGNOSIS — M6282 Rhabdomyolysis: Secondary | ICD-10-CM | POA: Diagnosis present

## 2019-03-04 LAB — URINALYSIS, ROUTINE W REFLEX MICROSCOPIC
Bilirubin Urine: NEGATIVE
Glucose, UA: NEGATIVE mg/dL
Ketones, ur: 5 mg/dL — AB
Nitrite: NEGATIVE
Protein, ur: 100 mg/dL — AB
RBC / HPF: >50 RBC/hpf — ABNORMAL HIGH (ref 0–5)
Specific Gravity, Urine: 1.017 (ref 1.005–1.030)
WBC, UA: >50 WBC/hpf — ABNORMAL HIGH (ref 0–5)
pH: 5 (ref 5.0–8.0)

## 2019-03-04 LAB — CBC WITH DIFFERENTIAL/PLATELET
Abs Immature Granulocytes: 0.07 10*3/uL (ref 0.00–0.07)
Basophils Absolute: 0 10*3/uL (ref 0.0–0.1)
Basophils Relative: 0 %
Eosinophils Absolute: 0 10*3/uL (ref 0.0–0.5)
Eosinophils Relative: 0 %
HCT: 38.8 % — ABNORMAL LOW (ref 39.0–52.0)
Hemoglobin: 12.2 g/dL — ABNORMAL LOW (ref 13.0–17.0)
Immature Granulocytes: 1 %
Lymphocytes Relative: 4 %
Lymphs Abs: 0.6 10*3/uL — ABNORMAL LOW (ref 0.7–4.0)
MCH: 24.2 pg — ABNORMAL LOW (ref 26.0–34.0)
MCHC: 31.4 g/dL (ref 30.0–36.0)
MCV: 76.8 fL — ABNORMAL LOW (ref 80.0–100.0)
Monocytes Absolute: 0.8 10*3/uL (ref 0.1–1.0)
Monocytes Relative: 6 %
Neutro Abs: 12.1 10*3/uL — ABNORMAL HIGH (ref 1.7–7.7)
Neutrophils Relative %: 89 %
Platelets: 297 10*3/uL (ref 150–400)
RBC: 5.05 MIL/uL (ref 4.22–5.81)
RDW: 19.9 % — ABNORMAL HIGH (ref 11.5–15.5)
WBC: 13.6 10*3/uL — ABNORMAL HIGH (ref 4.0–10.5)
nRBC: 0 % (ref 0.0–0.2)

## 2019-03-04 LAB — ACETAMINOPHEN LEVEL: Acetaminophen (Tylenol), Serum: <10 ug/mL — ABNORMAL LOW (ref 10–30)

## 2019-03-04 LAB — CK: Total CK: 4800 U/L — ABNORMAL HIGH (ref 49–397)

## 2019-03-04 LAB — COMPREHENSIVE METABOLIC PANEL
ALT: 35 U/L (ref 0–44)
AST: 69 U/L — ABNORMAL HIGH (ref 15–41)
Albumin: 2.8 g/dL — ABNORMAL LOW (ref 3.5–5.0)
Alkaline Phosphatase: 115 U/L (ref 38–126)
Anion gap: 24 — ABNORMAL HIGH (ref 5–15)
BUN: 111 mg/dL — ABNORMAL HIGH (ref 6–20)
CO2: 9 mmol/L — ABNORMAL LOW (ref 22–32)
Calcium: 7.6 mg/dL — ABNORMAL LOW (ref 8.9–10.3)
Chloride: 103 mmol/L (ref 98–111)
Creatinine, Ser: 8 mg/dL — ABNORMAL HIGH (ref 0.61–1.24)
GFR calc Af Amer: 9 mL/min — ABNORMAL LOW (ref >60)
GFR calc non Af Amer: 8 mL/min — ABNORMAL LOW (ref >60)
Glucose, Bld: 80 mg/dL (ref 70–99)
Potassium: 7 mmol/L (ref 3.5–5.1)
Sodium: 136 mmol/L (ref 135–145)
Total Bilirubin: 1.4 mg/dL — ABNORMAL HIGH (ref 0.3–1.2)
Total Protein: 8.2 g/dL — ABNORMAL HIGH (ref 6.5–8.1)

## 2019-03-04 LAB — LACTIC ACID, PLASMA
Lactic Acid, Venous: 2.1 mmol/L (ref 0.5–1.9)
Lactic Acid, Venous: 0.9 mmol/L (ref 0.5–1.9)

## 2019-03-04 LAB — RAPID URINE DRUG SCREEN, HOSP PERFORMED
Amphetamines: NOT DETECTED
Barbiturates: NOT DETECTED
Benzodiazepines: NOT DETECTED
Cocaine: NOT DETECTED
Opiates: POSITIVE — AB
Tetrahydrocannabinol: NOT DETECTED

## 2019-03-04 LAB — SALICYLATE LEVEL: Salicylate Lvl: <7 mg/dL (ref 2.8–30.0)

## 2019-03-04 MED ORDER — SODIUM CHLORIDE 0.9 % IV SOLN
INTRAVENOUS | Status: DC | PRN
  Administered 2019-03-04 – 2019-03-05 (×3): 250 mL via INTRAVENOUS

## 2019-03-04 MED ORDER — SODIUM CHLORIDE 0.9 % IV BOLUS (SEPSIS)
1000.0000 mL | Freq: Once | INTRAVENOUS | Status: AC
  Administered 2019-03-04: 1000 mL via INTRAVENOUS

## 2019-03-04 MED ORDER — VANCOMYCIN HCL 10 G IV SOLR
2000.0000 mg | Freq: Once | INTRAVENOUS | Status: AC
  Administered 2019-03-04: 22:00:00 2000 mg via INTRAVENOUS
  Filled 2019-03-04: qty 2000

## 2019-03-04 MED ORDER — SODIUM BICARBONATE 8.4 % IV SOLN
Freq: Once | INTRAVENOUS | Status: AC
  Administered 2019-03-05: 02:00:00 via INTRAVENOUS
  Filled 2019-03-04: qty 100

## 2019-03-04 MED ORDER — DEXTROSE 50 % IV SOLN
1.0000 | Freq: Once | INTRAVENOUS | Status: AC
  Administered 2019-03-04: 50 mL via INTRAVENOUS
  Filled 2019-03-04: qty 50

## 2019-03-04 MED ORDER — SODIUM CHLORIDE 0.9 % IV SOLN
2.0000 g | Freq: Once | INTRAVENOUS | Status: AC
  Administered 2019-03-04: 2 g via INTRAVENOUS
  Filled 2019-03-04: qty 2

## 2019-03-04 MED ORDER — CALCIUM GLUCONATE-NACL 1-0.675 GM/50ML-% IV SOLN
1.0000 g | Freq: Once | INTRAVENOUS | Status: AC
  Administered 2019-03-04: 1000 mg via INTRAVENOUS
  Filled 2019-03-04: qty 50

## 2019-03-04 MED ORDER — INSULIN ASPART 100 UNIT/ML IV SOLN
5.0000 [IU] | Freq: Once | INTRAVENOUS | Status: AC
  Administered 2019-03-04: 5 [IU] via INTRAVENOUS

## 2019-03-04 MED ORDER — SODIUM CHLORIDE 0.9 % IV BOLUS (SEPSIS)
500.0000 mL | Freq: Once | INTRAVENOUS | Status: AC
  Administered 2019-03-04: 500 mL via INTRAVENOUS

## 2019-03-04 MED ORDER — METRONIDAZOLE IN NACL 5-0.79 MG/ML-% IV SOLN
500.0000 mg | Freq: Once | INTRAVENOUS | Status: AC
  Administered 2019-03-04: 500 mg via INTRAVENOUS
  Filled 2019-03-04: qty 100

## 2019-03-04 MED ORDER — SODIUM CHLORIDE 0.9 % IV SOLN: 1.0000 g | Freq: Once | INTRAVENOUS | Status: DC

## 2019-03-04 MED ORDER — VANCOMYCIN HCL IN DEXTROSE 1-5 GM/200ML-% IV SOLN: 1000.0000 mg | Freq: Once | INTRAVENOUS | Status: DC

## 2019-03-04 MED ORDER — ACETAMINOPHEN 650 MG RE SUPP
650.0000 mg | Freq: Once | RECTAL | Status: AC
  Administered 2019-03-04: 22:00:00 650 mg via RECTAL
  Filled 2019-03-04: qty 1

## 2019-03-04 NOTE — ED Notes (Signed)
Pt gone to rad will attempt second set of blood cultures when he comes back

## 2019-03-04 NOTE — ED Notes (Signed)
Patient transported to CT 
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
## 2019-03-04 NOTE — ED Notes (Signed)
Only 1 set of cultures able to be obtained prior to ABX start

## 2019-03-04 NOTE — ED Provider Notes (Signed)
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 osteomyelitis, has significant abnormality of the left hip.  He does have redness over his left anterior hip.  This is questionable for cellulitis versus bruising from fall. CT head and neck were obtained without evidence of acute cervical or intracranial abnormalities.  CT of his neck showed concern for a right sided apical pneumothorax.  This was not seen on chest x-ray.  CT chest, abdomen, and pelvis were ordered, these were delayed due to needing to stabilize patient with degree of hyperkalemia.   His white count is elevated at 13.6, he has a mild anemia with a hemoglobin of 12.5.  His lactic is slightly elevated at 2.1.  CMP shows potassium of 7.0.  Creatinine is elevated at 8.0 which is new for patient.  His GFR is 9 with an anion gap of 24.  CK was elevated at 4800 consistent with rhabdomyolysis.  I suspect that this is the primary cause  for his hyperkalemia.  His hyperkalemia was treated with calcium gluconate, insulin, and D50.    Per sepsis protocol he was given 30/kg of fluid.  After this his blood pressure started trending back down, additional 1 L of fluids and Levophed were ordered.  Given concern for acute renal failure I spoke with on-call nephrologist Dr. Johnney Ou who requested that a dialysis catheter be placed and he be admitted to ICU.  This patient was seen as a shared visit with Dr. Billy Fischer.  I spoke with Dr. Emmit Alexanders from PCCM who will have PCCM see patient for admission.    Final Clinical Impressions(s) / ED Diagnoses   Final diagnoses:  Traumatic rhabdomyolysis, initial encounter ([place])  Acute renal failure, unspecified acute renal failure type (HCC)  Hypotension, unspecified hypotension type  Septic shock (HCC)  Altered mental status, unspecified altered mental status type  Pneumothorax, unspecified type  Chronic indwelling Foley catheter  Paraplegia following spinal cord injury [facility])    ED Discharge Orders    None       Lorin Glass, PA-C 03/05/19 0026    Gareth Morgan, MD 03/08/19 2127

## 2019-03-04 NOTE — ED Notes (Signed)
Unsuccessful attempt to get 2nd set of blood culture

## 2019-03-05 ENCOUNTER — Inpatient Hospital Stay (HOSPITAL_COMMUNITY): Payer: Medicaid Other

## 2019-03-05 ENCOUNTER — Emergency Department (HOSPITAL_COMMUNITY): Payer: Medicaid Other

## 2019-03-05 DIAGNOSIS — L89229 Pressure ulcer of left hip, unspecified stage: Secondary | ICD-10-CM | POA: Diagnosis not present

## 2019-03-05 DIAGNOSIS — G9389 Other specified disorders of brain: Secondary | ICD-10-CM | POA: Diagnosis present

## 2019-03-05 DIAGNOSIS — M25552 Pain in left hip: Secondary | ICD-10-CM | POA: Diagnosis present

## 2019-03-05 DIAGNOSIS — T796XXA Traumatic ischemia of muscle, initial encounter: Secondary | ICD-10-CM | POA: Diagnosis present

## 2019-03-05 DIAGNOSIS — I959 Hypotension, unspecified: Secondary | ICD-10-CM

## 2019-03-05 DIAGNOSIS — G8220 Paraplegia, unspecified: Secondary | ICD-10-CM

## 2019-03-05 DIAGNOSIS — R34 Anuria and oliguria: Secondary | ICD-10-CM | POA: Diagnosis present

## 2019-03-05 DIAGNOSIS — N179 Acute kidney failure, unspecified: Secondary | ICD-10-CM | POA: Insufficient documentation

## 2019-03-05 DIAGNOSIS — J939 Pneumothorax, unspecified: Secondary | ICD-10-CM | POA: Diagnosis present

## 2019-03-05 DIAGNOSIS — J982 Interstitial emphysema: Secondary | ICD-10-CM | POA: Diagnosis present

## 2019-03-05 DIAGNOSIS — E874 Mixed disorder of acid-base balance: Secondary | ICD-10-CM | POA: Diagnosis present

## 2019-03-05 DIAGNOSIS — L8990 Pressure ulcer of unspecified site, unspecified stage: Secondary | ICD-10-CM

## 2019-03-05 DIAGNOSIS — W19XXXA Unspecified fall, initial encounter: Secondary | ICD-10-CM | POA: Diagnosis present

## 2019-03-05 DIAGNOSIS — N170 Acute kidney failure with tubular necrosis: Secondary | ICD-10-CM | POA: Diagnosis present

## 2019-03-05 DIAGNOSIS — L89324 Pressure ulcer of left buttock, stage 4: Secondary | ICD-10-CM | POA: Diagnosis present

## 2019-03-05 DIAGNOSIS — T83518A Infection and inflammatory reaction due to other urinary catheter, initial encounter: Secondary | ICD-10-CM | POA: Diagnosis present

## 2019-03-05 DIAGNOSIS — M6282 Rhabdomyolysis: Secondary | ICD-10-CM | POA: Diagnosis not present

## 2019-03-05 DIAGNOSIS — R4182 Altered mental status, unspecified: Secondary | ICD-10-CM | POA: Diagnosis present

## 2019-03-05 DIAGNOSIS — Z23 Encounter for immunization: Secondary | ICD-10-CM | POA: Diagnosis not present

## 2019-03-05 DIAGNOSIS — A419 Sepsis, unspecified organism: Secondary | ICD-10-CM | POA: Diagnosis present

## 2019-03-05 DIAGNOSIS — B9620 Unspecified Escherichia coli [E. coli] as the cause of diseases classified elsewhere: Secondary | ICD-10-CM | POA: Diagnosis present

## 2019-03-05 DIAGNOSIS — E875 Hyperkalemia: Secondary | ICD-10-CM | POA: Diagnosis present

## 2019-03-05 DIAGNOSIS — K592 Neurogenic bowel, not elsewhere classified: Secondary | ICD-10-CM | POA: Diagnosis present

## 2019-03-05 DIAGNOSIS — Z960 Presence of urogenital implants: Secondary | ICD-10-CM | POA: Diagnosis not present

## 2019-03-05 DIAGNOSIS — Y846 Urinary catheterization as the cause of abnormal reaction of the patient, or of later complication, without mention of misadventure at the time of the procedure: Secondary | ICD-10-CM | POA: Diagnosis present

## 2019-03-05 DIAGNOSIS — R6521 Severe sepsis with septic shock: Secondary | ICD-10-CM | POA: Diagnosis present

## 2019-03-05 DIAGNOSIS — F17290 Nicotine dependence, other tobacco product, uncomplicated: Secondary | ICD-10-CM | POA: Diagnosis present

## 2019-03-05 DIAGNOSIS — G8929 Other chronic pain: Secondary | ICD-10-CM | POA: Diagnosis present

## 2019-03-05 DIAGNOSIS — N390 Urinary tract infection, site not specified: Secondary | ICD-10-CM | POA: Diagnosis present

## 2019-03-05 DIAGNOSIS — L89152 Pressure ulcer of sacral region, stage 2: Secondary | ICD-10-CM | POA: Diagnosis present

## 2019-03-05 DIAGNOSIS — N319 Neuromuscular dysfunction of bladder, unspecified: Secondary | ICD-10-CM | POA: Diagnosis present

## 2019-03-05 DIAGNOSIS — G9341 Metabolic encephalopathy: Secondary | ICD-10-CM | POA: Diagnosis present

## 2019-03-05 HISTORY — DX: Pressure ulcer of unspecified site, unspecified stage: L89.90

## 2019-03-05 LAB — BASIC METABOLIC PANEL
Anion gap: 14 (ref 5–15)
BUN: 101 mg/dL — ABNORMAL HIGH (ref 6–20)
CO2: 15 mmol/L — ABNORMAL LOW (ref 22–32)
Calcium: 7.5 mg/dL — ABNORMAL LOW (ref 8.9–10.3)
Chloride: 111 mmol/L (ref 98–111)
Creatinine, Ser: 6.48 mg/dL — ABNORMAL HIGH (ref 0.61–1.24)
GFR calc Af Amer: 12 mL/min — ABNORMAL LOW (ref >60)
GFR calc non Af Amer: 10 mL/min — ABNORMAL LOW (ref >60)
Glucose, Bld: 112 mg/dL — ABNORMAL HIGH (ref 70–99)
Potassium: 5.3 mmol/L — ABNORMAL HIGH (ref 3.5–5.1)
Sodium: 140 mmol/L (ref 135–145)

## 2019-03-05 LAB — CK: Total CK: 3778 U/L — ABNORMAL HIGH (ref 49–397)

## 2019-03-05 LAB — PHOSPHORUS: Phosphorus: 6.6 mg/dL — ABNORMAL HIGH (ref 2.5–4.6)

## 2019-03-05 LAB — NA AND K (SODIUM & POTASSIUM), RAND UR
Potassium Urine: 39 mmol/L
Sodium, Ur: 67 mmol/L

## 2019-03-05 LAB — RENAL FUNCTION PANEL
Albumin: 2.3 g/dL — ABNORMAL LOW (ref 3.5–5.0)
Anion gap: 17 — ABNORMAL HIGH (ref 5–15)
BUN: 70 mg/dL — ABNORMAL HIGH (ref 6–20)
CO2: 19 mmol/L — ABNORMAL LOW (ref 22–32)
Calcium: 7.8 mg/dL — ABNORMAL LOW (ref 8.9–10.3)
Chloride: 105 mmol/L (ref 98–111)
Creatinine, Ser: 4.85 mg/dL — ABNORMAL HIGH (ref 0.61–1.24)
GFR calc Af Amer: 17 mL/min — ABNORMAL LOW (ref >60)
GFR calc non Af Amer: 15 mL/min — ABNORMAL LOW (ref >60)
Glucose, Bld: 161 mg/dL — ABNORMAL HIGH (ref 70–99)
Phosphorus: 3.7 mg/dL (ref 2.5–4.6)
Potassium: 4.7 mmol/L (ref 3.5–5.1)
Sodium: 141 mmol/L (ref 135–145)

## 2019-03-05 LAB — CBC
HCT: 32 % — ABNORMAL LOW (ref 39.0–52.0)
Hemoglobin: 9.4 g/dL — ABNORMAL LOW (ref 13.0–17.0)
MCH: 22.9 pg — ABNORMAL LOW (ref 26.0–34.0)
MCHC: 29.4 g/dL — ABNORMAL LOW (ref 30.0–36.0)
MCV: 78 fL — ABNORMAL LOW (ref 80.0–100.0)
Platelets: 232 10*3/uL (ref 150–400)
RBC: 4.1 MIL/uL — ABNORMAL LOW (ref 4.22–5.81)
RDW: 17.2 % — ABNORMAL HIGH (ref 11.5–15.5)
WBC: 10.8 10*3/uL — ABNORMAL HIGH (ref 4.0–10.5)
nRBC: 0 % (ref 0.0–0.2)

## 2019-03-05 LAB — POCT I-STAT EG7
Acid-base deficit: 11 mmol/L — ABNORMAL HIGH (ref 0.0–2.0)
Bicarbonate: 15.2 mmol/L — ABNORMAL LOW (ref 20.0–28.0)
Calcium, Ion: 0.95 mmol/L — ABNORMAL LOW (ref 1.15–1.40)
HCT: 29 % — ABNORMAL LOW (ref 39.0–52.0)
Hemoglobin: 9.9 g/dL — ABNORMAL LOW (ref 13.0–17.0)
O2 Saturation: 99 %
Patient temperature: 100.9
Potassium: 5.7 mmol/L — ABNORMAL HIGH (ref 3.5–5.1)
Sodium: 142 mmol/L (ref 135–145)
TCO2: 16 mmol/L — ABNORMAL LOW (ref 22–32)
pCO2, Ven: 35.9 mmHg — ABNORMAL LOW (ref 44.0–60.0)
pH, Ven: 7.242 — ABNORMAL LOW (ref 7.250–7.430)
pO2, Ven: 180 mmHg — ABNORMAL HIGH (ref 32.0–45.0)

## 2019-03-05 LAB — OSMOLALITY, URINE: Osmolality, Ur: 359 mOsm/kg (ref 300–900)

## 2019-03-05 LAB — MAGNESIUM: Magnesium: 2.3 mg/dL (ref 1.7–2.4)

## 2019-03-05 LAB — CBG MONITORING, ED: Glucose-Capillary: 121 mg/dL — ABNORMAL HIGH (ref 70–99)

## 2019-03-05 LAB — POTASSIUM
Potassium: 6.4 mmol/L (ref 3.5–5.1)
Potassium: 4.8 mmol/L (ref 3.5–5.1)
Potassium: 4.7 mmol/L (ref 3.5–5.1)

## 2019-03-05 LAB — OSMOLALITY: Osmolality: 336 mOsm/kg (ref 275–295)

## 2019-03-05 LAB — MRSA PCR SCREENING: MRSA by PCR: NEGATIVE

## 2019-03-05 LAB — ETHANOL: Alcohol, Ethyl (B): <10 mg/dL (ref <10)

## 2019-03-05 LAB — APTT: aPTT: 36 seconds (ref 24–36)

## 2019-03-05 LAB — ETHYLENE GLYCOL: Ethylene Glycol Lvl: <5 mg/dL

## 2019-03-05 MED ORDER — NOREPINEPHRINE 4 MG/250ML-% IV SOLN: 0.0000 ug/min | INTRAVENOUS | Status: DC

## 2019-03-05 MED ORDER — MORPHINE SULFATE (PF) 2 MG/ML IV SOLN
1.0000 mg | INTRAVENOUS | Status: DC | PRN
  Administered 2019-03-05 – 2019-03-07 (×4): 1 mg via INTRAVENOUS
  Filled 2019-03-05 (×4): qty 1

## 2019-03-05 MED ORDER — PHENYLEPHRINE HCL-NACL 40-0.9 MG/250ML-% IV SOLN
0.0000 ug/min | INTRAVENOUS | Status: DC
  Administered 2019-03-05: 07:00:00 20 ug/min via INTRAVENOUS
  Administered 2019-03-06: 15 ug/min via INTRAVENOUS
  Filled 2019-03-05 (×2): qty 250

## 2019-03-05 MED ORDER — MUPIROCIN CALCIUM 2 % EX CREA
TOPICAL_CREAM | Freq: Every day | CUTANEOUS | Status: DC
  Administered 2019-03-06 – 2019-03-07 (×2): 1 via TOPICAL
  Administered 2019-03-10 – 2019-03-12 (×3): via TOPICAL
  Filled 2019-03-05 (×3): qty 15

## 2019-03-05 MED ORDER — ACETAMINOPHEN 325 MG PO TABS
650.0000 mg | ORAL_TABLET | ORAL | Status: DC | PRN
  Administered 2019-03-06: 650 mg via ORAL
  Filled 2019-03-05: qty 2

## 2019-03-05 MED ORDER — SODIUM CHLORIDE 0.9 % IV BOLUS
1000.0000 mL | Freq: Once | INTRAVENOUS | Status: AC
  Administered 2019-03-05: 02:00:00 1000 mL via INTRAVENOUS

## 2019-03-05 MED ORDER — PRISMASOL BGK 4/2.5 32-4-2.5 MEQ/L REPLACEMENT SOLN
Status: DC
  Administered 2019-03-05 (×2): via INTRAVENOUS_CENTRAL
  Filled 2019-03-05 (×7): qty 5000

## 2019-03-05 MED ORDER — CALCIUM GLUCONATE-NACL 1-0.675 GM/50ML-% IV SOLN
1.0000 g | Freq: Once | INTRAVENOUS | Status: AC
  Administered 2019-03-05: 1000 mg via INTRAVENOUS
  Filled 2019-03-05: qty 50

## 2019-03-05 MED ORDER — PRISMASOL BGK 0/2.5 32-2.5 MEQ/L IV SOLN
INTRAVENOUS | Status: DC
  Administered 2019-03-05 – 2019-03-06 (×8): via INTRAVENOUS_CENTRAL
  Filled 2019-03-05 (×20): qty 5000

## 2019-03-05 MED ORDER — SODIUM CHLORIDE 0.9 % IV SOLN
250.0000 [IU]/h | INTRAVENOUS | Status: DC
  Administered 2019-03-05: 250 [IU]/h via INTRAVENOUS_CENTRAL
  Administered 2019-03-05: 18:00:00 1850 [IU]/h via INTRAVENOUS_CENTRAL
  Administered 2019-03-05: 23:00:00 2200 [IU]/h via INTRAVENOUS_CENTRAL
  Administered 2019-03-05: 11:00:00 1350 [IU]/h via INTRAVENOUS_CENTRAL
  Administered 2019-03-06 (×3): 2450 [IU]/h via INTRAVENOUS_CENTRAL
  Filled 2019-03-05 (×10): qty 2

## 2019-03-05 MED ORDER — PREGABALIN 100 MG PO CAPS
300.0000 mg | ORAL_CAPSULE | Freq: Two times a day (BID) | ORAL | Status: DC
  Administered 2019-03-05 – 2019-03-07 (×4): 300 mg via ORAL
  Filled 2019-03-05 (×4): qty 3

## 2019-03-05 MED ORDER — MIDODRINE HCL 5 MG PO TABS
5.0000 mg | ORAL_TABLET | Freq: Three times a day (TID) | ORAL | Status: AC
  Administered 2019-03-05 – 2019-03-07 (×6): 5 mg via ORAL
  Filled 2019-03-05 (×6): qty 1

## 2019-03-05 MED ORDER — SODIUM CHLORIDE 0.9 % IV SOLN: 1.0000 g | INTRAVENOUS | Status: DC

## 2019-03-05 MED ORDER — SODIUM CHLORIDE 0.9 % IV SOLN
2.0000 g | Freq: Two times a day (BID) | INTRAVENOUS | Status: DC
  Administered 2019-03-05 (×2): 2 g via INTRAVENOUS
  Filled 2019-03-05 (×5): qty 2

## 2019-03-05 MED ORDER — DULOXETINE HCL 30 MG PO CPEP
30.0000 mg | ORAL_CAPSULE | Freq: Two times a day (BID) | ORAL | Status: DC
  Administered 2019-03-05 – 2019-03-12 (×15): 30 mg via ORAL
  Filled 2019-03-05 (×15): qty 1

## 2019-03-05 MED ORDER — INFLUENZA VAC SPLIT QUAD 0.5 ML IM SUSY
0.5000 mL | PREFILLED_SYRINGE | INTRAMUSCULAR | Status: AC
  Administered 2019-03-08: 0.5 mL via INTRAMUSCULAR
  Filled 2019-03-05: qty 0.5

## 2019-03-05 MED ORDER — WHITE PETROLATUM EX OINT
TOPICAL_OINTMENT | CUTANEOUS | Status: AC
  Administered 2019-03-05: 1
  Filled 2019-03-05: qty 28.35

## 2019-03-05 MED ORDER — HEPARIN SODIUM (PORCINE) 5000 UNIT/ML IJ SOLN
5000.0000 [IU] | Freq: Three times a day (TID) | INTRAMUSCULAR | Status: DC
  Administered 2019-03-05 – 2019-03-12 (×20): 5000 [IU] via SUBCUTANEOUS
  Filled 2019-03-05 (×17): qty 1

## 2019-03-05 MED ORDER — BACLOFEN 10 MG PO TABS: 10.0000 mg | ORAL_TABLET | Freq: Two times a day (BID) | ORAL | Status: DC

## 2019-03-05 MED ORDER — OXYCODONE-ACETAMINOPHEN 5-325 MG PO TABS
1.0000 | ORAL_TABLET | Freq: Three times a day (TID) | ORAL | Status: DC
  Administered 2019-03-05 – 2019-03-11 (×20): 1 via ORAL
  Filled 2019-03-05 (×20): qty 1

## 2019-03-05 MED ORDER — PRISMASOL BGK 4/2.5 32-4-2.5 MEQ/L REPLACEMENT SOLN
Status: DC
  Administered 2019-03-05 (×2): via INTRAVENOUS_CENTRAL
  Filled 2019-03-05 (×7): qty 5000

## 2019-03-05 MED ORDER — PREGABALIN 100 MG PO CAPS
100.0000 mg | ORAL_CAPSULE | Freq: Two times a day (BID) | ORAL | Status: DC
  Administered 2019-03-05: 100 mg via ORAL
  Filled 2019-03-05: qty 1

## 2019-03-05 MED ORDER — HEPARIN BOLUS VIA INFUSION (CRRT)
1000.0000 [IU] | INTRAVENOUS | Status: DC | PRN
  Administered 2019-03-05 (×2): 1000 [IU] via INTRAVENOUS_CENTRAL
  Filled 2019-03-05 (×2): qty 1000

## 2019-03-05 MED ORDER — PHENYLEPHRINE HCL-NACL 10-0.9 MG/250ML-% IV SOLN: 0.0000 ug/min | INTRAVENOUS | Status: DC

## 2019-03-05 MED ORDER — SODIUM CHLORIDE 0.9 % IV SOLN
INTRAVENOUS | Status: DC
  Administered 2019-03-05 – 2019-03-06 (×6): via INTRAVENOUS

## 2019-03-05 MED ORDER — SODIUM CHLORIDE 0.9 % IV SOLN: INTRAVENOUS | Status: DC | PRN

## 2019-03-05 MED ORDER — OXYCODONE ER 36 MG PO C12A: 36.0000 mg | EXTENDED_RELEASE_CAPSULE | Freq: Two times a day (BID) | ORAL | Status: DC

## 2019-03-05 MED ORDER — PNEUMOCOCCAL VAC POLYVALENT 25 MCG/0.5ML IJ INJ
0.5000 mL | INJECTION | INTRAMUSCULAR | Status: AC
  Administered 2019-03-08: 0.5 mL via INTRAMUSCULAR
  Filled 2019-03-05 (×2): qty 0.5

## 2019-03-05 MED ORDER — OXYCODONE HCL ER 15 MG PO T12A
35.0000 mg | EXTENDED_RELEASE_TABLET | Freq: Two times a day (BID) | ORAL | Status: DC
  Administered 2019-03-06 – 2019-03-12 (×13): 35 mg via ORAL
  Filled 2019-03-05 (×13): qty 1

## 2019-03-05 MED ORDER — VANCOMYCIN VARIABLE DOSE PER UNSTABLE RENAL FUNCTION (PHARMACIST DOSING): Status: DC

## 2019-03-05 MED ORDER — HEPARIN SODIUM (PORCINE) 1000 UNIT/ML DIALYSIS
1000.0000 [IU] | INTRAMUSCULAR | Status: DC | PRN
  Administered 2019-03-06: 6000 [IU] via INTRAVENOUS_CENTRAL
  Filled 2019-03-05 (×3): qty 6

## 2019-03-05 MED ORDER — HEPARIN SODIUM (PORCINE) 1000 UNIT/ML IJ SOLN: 2400.0000 [IU] | Freq: Once | INTRAMUSCULAR | Status: DC
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
## 2019-03-05 NOTE — Progress Notes (Signed)
eLink Physician-Brief Progress Note Patient Name: Sean Horton DOB: 03-Jan-1986 MRN: [id number]   Date of Service  03/05/2019  HPI/Events of Note  Hypotension - BP = 89/63 with MAP = 72. Patient is on CRRT.   eICU Interventions  Will order: 1. Phenylephrine IV infusion. Titrate to MAP >= 65.      Intervention Category Major Interventions: Hypotension - evaluation and management  Master Touchet Eugene 03/05/2019, 3:32 AM

## 2019-03-05 NOTE — Progress Notes (Signed)
eLink Physician-Brief Progress Note Patient Name: Sean Horton DOB: 1986-09-23 MRN: [id number]   Date of Service  03/05/2019  HPI/Events of Note  Patient complaining of pain at HD catheter site. Bedside assessment with no notable redness or swelling. Already on scheduled oxycodone for chronic back pain.  eICU Interventions  Ordered morphine 1 mg IV q 6 prn for severe breakthrough pain     Intervention Category Intermediate Interventions: Pain - evaluation and management  Judd Lien 03/05/2019, 9:51 PM

## 2019-03-05 NOTE — Progress Notes (Signed)
eLink Physician-Brief Progress Note Patient Name: Sean Horton DOB: 28-Oct-1986 MRN: [id number]   Date of Service  03/05/2019  HPI/Events of Note  Notified of Temp 101.5 already on antibiotics. Patient also on scheduled oxycodone, mildly elevated liver enzymes  eICU Interventions   Ordered Tylenol 650 prn for temp >101.  Would still caution not to go over 2g of acetaminophen in 24 hours     Intervention Category Intermediate Interventions: Other:  Judd Lien 03/05/2019, 11:45 PM
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
## 2019-03-05 NOTE — Consult Note (Addendum)
[facility] Nurse wound consult note Reason for Consult: Consult requested for left hip wound; pt is familiar to [facility] team from previous admission in Dec when he had a Vac dressing.  He states the wound has healed almost completely at this time.  Pink dry scar tissue surrounding a 3X3cm area of dark red, dry scabbed skin, which removes easily, revealing pink moist wound 2X2X.1cm, healing stage 4 pressure injury; small amt bleeding, bone palpable. Pressure Injury POA: Yes Dressing procedure/placement/frequency: Bactroban to promote moist healing and foam dressing to protect from further injury.  Pt is on a low airloss bed to reduce pressure. Please re-consult if further assistance is needed.  Thank-you,  Julien Girt MSN, [place], [place], [place], [place]
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
## 2019-03-05 NOTE — Procedures (Signed)
Hemodialysis Catheter Insertion Procedure Note SUTTER AHLGREN [id number] [birth year]  Procedure: Insertion of Hemodialysis Catheter Indications: CRRT  Procedure Details Consent: Risks of procedure as well as the alternatives and risks of each were explained to the (patient/caregiver).  Consent for procedure obtained. Time Out: Verified patient identification, verified procedure, site/side was marked, verified correct patient position, special equipment/implants available, medications/allergies/relevent history reviewed, required imaging and test results available.  Performed  Maximum sterile technique was used including antiseptics, cap, gloves, gown, hand hygiene, mask and sheet. Skin prep: Chlorhexidine; local anesthetic administered A antimicrobial bonded/coated triple lumen catheter was placed in the right internal jugular vein using the Seldinger technique.  Evaluation Blood flow good Complications: No apparent complications Patient did tolerate procedure well. Chest X-ray ordered to verify placement.  CXR: normal.  Procedure performed under direct ultrasound guidance for real time vessel cannulation.      Montey Hora, [facility] Pgr: [phone]  or [phone] 03/05/2019, 2:05 AM

## 2019-03-05 NOTE — Progress Notes (Addendum)
Pharmacy Antibiotic Note  Sean Horton is a 33 y.o. male admitted on 03/04/2019 with sepsis.  Pharmacy has been consulted for vancomycin and cefepime dosing.  Pt w/ AKI, baseline SCr ~1.2, currently 8.  Plan: Vancomycin 2g IV given in ED; will monitor SCr prior to redosing. Cefepime 2g x1 then 1g IV Q24H.   Temp (24hrs), Avg:100.9 F (38.3 C), Min:100.9 F (38.3 C), Max:100.9 F (38.3 C)  Recent Labs  Lab 03/04/19 2124 03/04/19 2131 03/04/19 2324  WBC 13.6*  --   --   CREATININE  --  8.00*  --   LATICACIDVEN 2.1*  --  0.9    No Known Allergies   Thank you for allowing pharmacy to be a part of this patient's care.  Wynona Neat, PharmD, BCPS  03/05/2019 12:27 AM   Addendum: Deniece Ree to be d/c'd given likely urinary source. Now to start CRRT.  Change cefepime to 2g IV Q12H.    VB 2:06 AM

## 2019-03-05 NOTE — Progress Notes (Signed)
Paged about complaints of pain  Lyrica changed to home dose Extended release oxycodone will be resumed-first dose in a.m.

## 2019-03-05 NOTE — ED Notes (Signed)
Patient transported to CT 
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
## 2019-03-05 NOTE — ED Notes (Signed)
Date and time results received: 03/05/19 12:17 AM (use smartphrase ".now" to insert current time)  Test: osmolality Critical Value: 336  Name of Provider Notified: Dorene Ar  Orders Received? Or Actions Taken?:

## 2019-03-06 ENCOUNTER — Inpatient Hospital Stay (HOSPITAL_COMMUNITY): Payer: Medicaid Other

## 2019-03-06 DIAGNOSIS — N170 Acute kidney failure with tubular necrosis: Secondary | ICD-10-CM

## 2019-03-06 DIAGNOSIS — M6282 Rhabdomyolysis: Secondary | ICD-10-CM

## 2019-03-06 DIAGNOSIS — L89229 Pressure ulcer of left hip, unspecified stage: Secondary | ICD-10-CM

## 2019-03-06 DIAGNOSIS — J982 Interstitial emphysema: Secondary | ICD-10-CM

## 2019-03-06 LAB — RENAL FUNCTION PANEL
Albumin: 2.1 g/dL — ABNORMAL LOW (ref 3.5–5.0)
Albumin: 2.1 g/dL — ABNORMAL LOW (ref 3.5–5.0)
Anion gap: 10 (ref 5–15)
Anion gap: 9 (ref 5–15)
BUN: 43 mg/dL — ABNORMAL HIGH (ref 6–20)
BUN: 35 mg/dL — ABNORMAL HIGH (ref 6–20)
CO2: 24 mmol/L (ref 22–32)
CO2: 25 mmol/L (ref 22–32)
Calcium: 8.1 mg/dL — ABNORMAL LOW (ref 8.9–10.3)
Calcium: 8.1 mg/dL — ABNORMAL LOW (ref 8.9–10.3)
Chloride: 105 mmol/L (ref 98–111)
Chloride: 104 mmol/L (ref 98–111)
Creatinine, Ser: 3.27 mg/dL — ABNORMAL HIGH (ref 0.61–1.24)
Creatinine, Ser: 2.83 mg/dL — ABNORMAL HIGH (ref 0.61–1.24)
GFR calc Af Amer: 27 mL/min — ABNORMAL LOW (ref >60)
GFR calc Af Amer: 32 mL/min — ABNORMAL LOW (ref >60)
GFR calc non Af Amer: 24 mL/min — ABNORMAL LOW (ref >60)
GFR calc non Af Amer: 28 mL/min — ABNORMAL LOW (ref >60)
Glucose, Bld: 107 mg/dL — ABNORMAL HIGH (ref 70–99)
Glucose, Bld: 99 mg/dL (ref 70–99)
Phosphorus: 3 mg/dL (ref 2.5–4.6)
Phosphorus: 3 mg/dL (ref 2.5–4.6)
Potassium: 4 mmol/L (ref 3.5–5.1)
Potassium: 3.9 mmol/L (ref 3.5–5.1)
Sodium: 139 mmol/L (ref 135–145)
Sodium: 138 mmol/L (ref 135–145)

## 2019-03-06 LAB — APTT: aPTT: 145 seconds — ABNORMAL HIGH (ref 24–36)

## 2019-03-06 LAB — POCT ACTIVATED CLOTTING TIME
Activated Clotting Time: 147 seconds
Activated Clotting Time: 147 seconds
Activated Clotting Time: 147 seconds
Activated Clotting Time: 153 seconds
Activated Clotting Time: 153 seconds
Activated Clotting Time: 153 seconds
Activated Clotting Time: 153 seconds
Activated Clotting Time: 158 seconds
Activated Clotting Time: 164 seconds
Activated Clotting Time: 164 seconds
Activated Clotting Time: 169 seconds
Activated Clotting Time: 169 seconds
Activated Clotting Time: 169 seconds
Activated Clotting Time: 169 seconds
Activated Clotting Time: 169 seconds
Activated Clotting Time: 175 seconds
Activated Clotting Time: 175 seconds
Activated Clotting Time: 175 seconds
Activated Clotting Time: 180 seconds
Activated Clotting Time: 186 seconds
Activated Clotting Time: 186 seconds
Activated Clotting Time: 186 seconds
Activated Clotting Time: 191 seconds
Activated Clotting Time: 202 seconds
Activated Clotting Time: 208 seconds
Activated Clotting Time: 197 seconds
Activated Clotting Time: 197 seconds
Activated Clotting Time: 197 seconds
Activated Clotting Time: 191 seconds

## 2019-03-06 LAB — CBC
HCT: 27.4 % — ABNORMAL LOW (ref 39.0–52.0)
Hemoglobin: 8.3 g/dL — ABNORMAL LOW (ref 13.0–17.0)
MCH: 23 pg — ABNORMAL LOW (ref 26.0–34.0)
MCHC: 30.3 g/dL (ref 30.0–36.0)
MCV: 75.9 fL — ABNORMAL LOW (ref 80.0–100.0)
Platelets: 211 10*3/uL (ref 150–400)
RBC: 3.61 MIL/uL — ABNORMAL LOW (ref 4.22–5.81)
RDW: 16.9 % — ABNORMAL HIGH (ref 11.5–15.5)
WBC: 8.7 10*3/uL (ref 4.0–10.5)
nRBC: 0 % (ref 0.0–0.2)

## 2019-03-06 LAB — URINE CULTURE: Culture: >=100000 — AB

## 2019-03-06 LAB — CK: Total CK: 1917 U/L — ABNORMAL HIGH (ref 49–397)

## 2019-03-06 MED ORDER — SODIUM CHLORIDE 0.9 % IV SOLN
2.0000 g | Freq: Every day | INTRAVENOUS | Status: DC
  Administered 2019-03-06 – 2019-03-08 (×3): 2 g via INTRAVENOUS
  Filled 2019-03-06 (×4): qty 20

## 2019-03-06 MED ORDER — ONDANSETRON HCL 4 MG/2ML IJ SOLN
4.0000 mg | Freq: Three times a day (TID) | INTRAMUSCULAR | Status: DC | PRN
  Administered 2019-03-06: 4 mg via INTRAVENOUS
  Filled 2019-03-06: qty 2

## 2019-03-06 MED ORDER — SODIUM CHLORIDE 0.9 % IV SOLN
1.0000 g | Freq: Every day | INTRAVENOUS | Status: DC
  Filled 2019-03-06 (×2): qty 10

## 2019-03-06 MED ORDER — DOCUSATE SODIUM 100 MG PO CAPS
100.0000 mg | ORAL_CAPSULE | Freq: Two times a day (BID) | ORAL | Status: DC
  Administered 2019-03-06 – 2019-03-11 (×12): 100 mg via ORAL
  Filled 2019-03-06 (×13): qty 1

## 2019-03-06 MED ORDER — HYDROMORPHONE HCL 1 MG/ML IJ SOLN
1.0000 mg | Freq: Once | INTRAMUSCULAR | Status: AC
  Administered 2019-03-06: 09:00:00 1 mg via INTRAVENOUS
  Filled 2019-03-06: qty 1

## 2019-03-06 MED ORDER — HYDROMORPHONE HCL 1 MG/ML IJ SOLN
1.0000 mg | Freq: Once | INTRAMUSCULAR | Status: AC | PRN
  Administered 2019-03-07: 02:00:00 1 mg via INTRAVENOUS
  Filled 2019-03-06: qty 1

## 2019-03-06 NOTE — Progress Notes (Signed)
Encephalopathy is multifactorial  Likely related to metabolic derangement

## 2019-03-06 NOTE — Progress Notes (Signed)
eLink Physician-Brief Progress Note Patient Name: Sean Horton DOB: Jun 06, 1986 MRN: [id number]   Date of Service  03/06/2019  HPI/Events of Note  Patient nauseated. Seen not in acute distress  eICU Interventions  Zofran 4 mg IV prn ordered     Intervention Category Intermediate Interventions: Other:  Judd Lien 03/06/2019, 6:56 AM
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
## 2019-03-06 NOTE — Progress Notes (Signed)
There is a relationship between urinary tract infections and chronic Foley catheter

## 2019-03-07 DIAGNOSIS — N390 Urinary tract infection, site not specified: Secondary | ICD-10-CM

## 2019-03-07 DIAGNOSIS — B9620 Unspecified Escherichia coli [E. coli] as the cause of diseases classified elsewhere: Secondary | ICD-10-CM

## 2019-03-07 LAB — CBC
HCT: 26.3 % — ABNORMAL LOW (ref 39.0–52.0)
Hemoglobin: 7.7 g/dL — ABNORMAL LOW (ref 13.0–17.0)
MCH: 23.1 pg — ABNORMAL LOW (ref 26.0–34.0)
MCHC: 29.3 g/dL — ABNORMAL LOW (ref 30.0–36.0)
MCV: 78.7 fL — ABNORMAL LOW (ref 80.0–100.0)
Platelets: 218 10*3/uL (ref 150–400)
RBC: 3.34 MIL/uL — ABNORMAL LOW (ref 4.22–5.81)
RDW: 17.2 % — ABNORMAL HIGH (ref 11.5–15.5)
WBC: 8.8 10*3/uL (ref 4.0–10.5)
nRBC: 0 % (ref 0.0–0.2)

## 2019-03-07 LAB — RENAL FUNCTION PANEL
Albumin: 2 g/dL — ABNORMAL LOW (ref 3.5–5.0)
Albumin: 2.3 g/dL — ABNORMAL LOW (ref 3.5–5.0)
Anion gap: 9 (ref 5–15)
Anion gap: 11 (ref 5–15)
BUN: 39 mg/dL — ABNORMAL HIGH (ref 6–20)
BUN: 46 mg/dL — ABNORMAL HIGH (ref 6–20)
CO2: 23 mmol/L (ref 22–32)
CO2: 20 mmol/L — ABNORMAL LOW (ref 22–32)
Calcium: 8 mg/dL — ABNORMAL LOW (ref 8.9–10.3)
Calcium: 8.3 mg/dL — ABNORMAL LOW (ref 8.9–10.3)
Chloride: 105 mmol/L (ref 98–111)
Chloride: 104 mmol/L (ref 98–111)
Creatinine, Ser: 3.53 mg/dL — ABNORMAL HIGH (ref 0.61–1.24)
Creatinine, Ser: 3.71 mg/dL — ABNORMAL HIGH (ref 0.61–1.24)
GFR calc Af Amer: 25 mL/min — ABNORMAL LOW (ref >60)
GFR calc Af Amer: 23 mL/min — ABNORMAL LOW (ref >60)
GFR calc non Af Amer: 21 mL/min — ABNORMAL LOW (ref >60)
GFR calc non Af Amer: 20 mL/min — ABNORMAL LOW (ref >60)
Glucose, Bld: 131 mg/dL — ABNORMAL HIGH (ref 70–99)
Glucose, Bld: 84 mg/dL (ref 70–99)
Phosphorus: 3.2 mg/dL (ref 2.5–4.6)
Phosphorus: 4.1 mg/dL (ref 2.5–4.6)
Potassium: 4.3 mmol/L (ref 3.5–5.1)
Potassium: 5.4 mmol/L — ABNORMAL HIGH (ref 3.5–5.1)
Sodium: 137 mmol/L (ref 135–145)
Sodium: 135 mmol/L (ref 135–145)

## 2019-03-07 LAB — CK: Total CK: 1099 U/L — ABNORMAL HIGH (ref 49–397)

## 2019-03-07 LAB — APTT: aPTT: 43 seconds — ABNORMAL HIGH (ref 24–36)

## 2019-03-07 MED ORDER — HYDROMORPHONE HCL 1 MG/ML IJ SOLN
1.0000 mg | INTRAMUSCULAR | Status: DC | PRN
  Administered 2019-03-07 – 2019-03-11 (×21): 1 mg via INTRAVENOUS
  Filled 2019-03-07 (×21): qty 1

## 2019-03-07 MED ORDER — PREGABALIN 50 MG PO CAPS
50.0000 mg | ORAL_CAPSULE | Freq: Two times a day (BID) | ORAL | Status: DC
  Administered 2019-03-07 – 2019-03-10 (×7): 50 mg via ORAL
  Filled 2019-03-07 (×7): qty 1

## 2019-03-07 NOTE — Progress Notes (Signed)
[facility] Progress Note    Assessment/ Plan:   67M T3 paraplegia, neurogenic bladder, chronic pain who presented after being found down, found to have hypotension, fever, severe AKI.   1.  AMS: Resolved.d/t septic shock and medication accumulation from AKI, poss contribution of uremia. Better after being on CRRT and receiving antibiotics, pressors.  Have d/c'd baclofen, use lyrica with extreme caution and would decrease dose to no more than 50 BID for now.  2.  Septic shock:  Resolved.  Blood cultures negative.  Urine culture growing pan-S Ecoli, on CTX, phenylephrine off  3.  AKI: presentation consistent with ATN in setting of hypotension and possible sepsis.  CK elevated which is not helping, but not high enough to be the primary issue here (may be lower in paraplegia however).  Required CRRT 4/8-4/9.  Making more urine, CRRT off, now awaiting renal recovery.  4.  AG metabolic acidosis + resp acidosis:  resolved  5.  Hyperkalemia:  corrected with dialysis.    6.  Small R pneumothorax- supportive care  7.  T3 paraplegia: likely with dysautonomia, on midodrine  Subjective:    Off pressors and CRRT.  Making good urine.     Objective:   BP 110/63   Pulse 80   Temp 99.1 F (37.3 C) (Oral)   Resp 12   Ht 6\' 5"  (1.956 m)   Wt 101.4 kg   SpO2 (!) 84%   BMI 26.51 kg/m   Intake/Output Summary (Last 24 hours) at 03/07/2019 0851 Last data filed at 03/07/2019 0600 Gross per 24 hour  Intake 4251.97 ml  Output 2499 ml  Net 1752.97 ml   Weight change:   Physical Exam: Gen: AAO x 3 Eyes: anicteric, EOMI ENT: MMM Neck: supple CV: RRR, no rub Abd: nontender today GU: + cloudy but clearing urine Extr: no edema Neuro: + paralysis of bilateral LEs Skin: ecchymosis noted LUQ, several centrally hypopigmented areas noted on chest that look like possibly sequelae of prior trauma, L hip with long scar with central scab ~5cm; no drainage or erythema.  Bruises on R  knee, heel protectors in place  Imaging: Dg Chest Port 1 View  Result Date: 03/06/2019 CLINICAL DATA:  Respiratory failure EXAM: PORTABLE CHEST 1 VIEW COMPARISON:  03/05/2019 FINDINGS: Right Vas-Cath remains in place, unchanged. No visible pneumothorax. Right base atelectasis. Left lung clear. No effusions. Heart is upper limits normal in size. IMPRESSION: Right base atelectasis. Electronically Signed   By: Rolm Baptise M.D.   On: 03/06/2019 08:17    Labs: BMET Recent Labs  Lab 03/04/19 2131  03/05/19 0031 03/05/19 0448 03/05/19 0906 03/05/19 1310 03/05/19 1633 03/06/19 0919 03/06/19 1610  NA 136  --  142 140  --   --  141 139 138  K 7.0*   < > 5.7* 5.3* 4.8 4.7 4.7 4.0 3.9  CL 103  --   --  111  --   --  105 105 104  CO2 9*  --   --  15*  --   --  19* 24 25  GLUCOSE 80  --   --  112*  --   --  161* 107* 99  BUN 111*  --   --  101*  --   --  70* 43* 35*  CREATININE 8.00*  --   --  6.48*  --   --  4.85* 3.27* 2.83*  CALCIUM 7.6*  --   --  7.5*  --   --  7.8* 8.1* 8.1*  PHOS  --   --   --  6.6*  --   --  3.7 3.0 3.0   < > = values in this interval not displayed.   CBC Recent Labs  Lab 03/04/19 2124 03/05/19 0031 03/05/19 0448 03/06/19 0919  WBC 13.6*  --  10.8* 8.7  NEUTROABS 12.1*  --   --   --   HGB 12.2* 9.9* 9.4* 8.3*  HCT 38.8* 29.0* 32.0* 27.4*  MCV 76.8*  --  78.0* 75.9*  PLT 297  --  232 211    Medications:    . docusate sodium  100 mg Oral BID  . DULoxetine  30 mg Oral BID  . heparin  2,400 Units Intravenous Once  . heparin  5,000 Units Subcutaneous Q8H  . Influenza vac split quadrivalent PF  0.5 mL Intramuscular Tomorrow-1000  . midodrine  5 mg Oral TID WC  . mupirocin cream   Topical Daily  . oxyCODONE  35 mg Oral Q12H  . oxyCODONE-acetaminophen  1 tablet Oral Q8H  . pneumococcal 23 valent vaccine  0.5 mL Intramuscular Tomorrow-1000  . pregabalin  300 mg Oral BID      Madelon Lips, MD [facility] pgr [phone] 03/07/2019,  8:51 AM
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
## 2019-03-07 NOTE — Progress Notes (Signed)
NEW ADMISSION NOTE New Admission Note:   Arrival Method: Patient arrived in air mattress bed from 4N. Mental Orientation: Alert and oriented x 4.   Telemetry: N/A Assessment: Completed Skin:  Documented in the flow sheets. IV:  RIJ HD cath, L AC and R AC SL.  Pain:  8/10, administered medication. Tubes:  Foley cath Safety Measures: Safety Fall Prevention Plan has been given, discussed and signed Admission: Completed [facility] Orientation: Patient has been orientated to the room, unit and staff.  Family: N/A  Orders have been reviewed and implemented. Will continue to monitor the patient. Call light has been placed within reach and bed alarm has been activated.   Amaryllis Dyke, RN

## 2019-03-08 ENCOUNTER — Other Ambulatory Visit: Payer: Self-pay

## 2019-03-08 ENCOUNTER — Inpatient Hospital Stay (HOSPITAL_COMMUNITY): Payer: Medicaid Other

## 2019-03-08 DIAGNOSIS — Z960 Presence of urogenital implants: Secondary | ICD-10-CM

## 2019-03-08 LAB — RENAL FUNCTION PANEL
Albumin: 2 g/dL — ABNORMAL LOW (ref 3.5–5.0)
Anion gap: 10 (ref 5–15)
BUN: 46 mg/dL — ABNORMAL HIGH (ref 6–20)
CO2: 24 mmol/L (ref 22–32)
Calcium: 8.5 mg/dL — ABNORMAL LOW (ref 8.9–10.3)
Chloride: 106 mmol/L (ref 98–111)
Creatinine, Ser: 3.94 mg/dL — ABNORMAL HIGH (ref 0.61–1.24)
GFR calc Af Amer: 22 mL/min — ABNORMAL LOW (ref >60)
GFR calc non Af Amer: 19 mL/min — ABNORMAL LOW (ref >60)
Glucose, Bld: 159 mg/dL — ABNORMAL HIGH (ref 70–99)
Phosphorus: 4 mg/dL (ref 2.5–4.6)
Potassium: 4.3 mmol/L (ref 3.5–5.1)
Sodium: 140 mmol/L (ref 135–145)

## 2019-03-08 MED ORDER — WHITE PETROLATUM EX OINT
TOPICAL_OINTMENT | CUTANEOUS | Status: AC
  Administered 2019-03-08: 1
  Filled 2019-03-08: qty 28.35

## 2019-03-08 MED ORDER — CEPHALEXIN 500 MG PO CAPS
500.0000 mg | ORAL_CAPSULE | Freq: Two times a day (BID) | ORAL | Status: DC
  Administered 2019-03-09 – 2019-03-12 (×7): 500 mg via ORAL
  Filled 2019-03-08 (×7): qty 1

## 2019-03-08 MED ORDER — HYDROMORPHONE HCL 1 MG/ML IJ SOLN
1.0000 mg | Freq: Once | INTRAMUSCULAR | Status: AC
  Administered 2019-03-08: 1 mg via INTRAVENOUS
  Filled 2019-03-08: qty 1

## 2019-03-08 NOTE — Plan of Care (Signed)
  Problem: Clinical Measurements: Goal: Respiratory complications will improve Outcome: Progressing   Problem: Clinical Measurements: Goal: Will remain free from infection Outcome: Progressing   Problem: Nutrition: Goal: Adequate nutrition will be maintained Outcome: Progressing   Problem: Coping: Goal: Level of anxiety will decrease Outcome: Progressing   Problem: Elimination: Goal: Will not experience complications related to urinary retention Outcome: Progressing   Problem: Pain Managment: Goal: General experience of comfort will improve Outcome: Progressing   Problem: Skin Integrity: Goal: Risk for impaired skin integrity will decrease Outcome: Progressing
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
## 2019-03-09 LAB — CULTURE, BLOOD (ROUTINE X 2): Culture: NO GROWTH

## 2019-03-09 LAB — RENAL FUNCTION PANEL
Albumin: 2.3 g/dL — ABNORMAL LOW (ref 3.5–5.0)
Anion gap: 13 (ref 5–15)
BUN: 49 mg/dL — ABNORMAL HIGH (ref 6–20)
CO2: 24 mmol/L (ref 22–32)
Calcium: 9 mg/dL (ref 8.9–10.3)
Chloride: 103 mmol/L (ref 98–111)
Creatinine, Ser: 3.51 mg/dL — ABNORMAL HIGH (ref 0.61–1.24)
GFR calc Af Amer: 25 mL/min — ABNORMAL LOW (ref >60)
GFR calc non Af Amer: 22 mL/min — ABNORMAL LOW (ref >60)
Glucose, Bld: 132 mg/dL — ABNORMAL HIGH (ref 70–99)
Phosphorus: 3.9 mg/dL (ref 2.5–4.6)
Potassium: 4.6 mmol/L (ref 3.5–5.1)
Sodium: 140 mmol/L (ref 135–145)

## 2019-03-09 LAB — CK: Total CK: 236 U/L (ref 49–397)

## 2019-03-09 MED ORDER — LIDOCAINE-PRILOCAINE 2.5-2.5 % EX CREA
TOPICAL_CREAM | Freq: Every day | CUTANEOUS | Status: DC | PRN
  Administered 2019-03-09 – 2019-03-12 (×2): via TOPICAL
  Filled 2019-03-09 (×2): qty 5

## 2019-03-09 MED ORDER — FENTANYL CITRATE (PF) 100 MCG/2ML IJ SOLN
25.0000 ug | INTRAMUSCULAR | Status: AC | PRN
  Administered 2019-03-09 (×3): 25 ug via INTRAVENOUS
  Filled 2019-03-09 (×3): qty 2

## 2019-03-09 MED ORDER — OXYCODONE-ACETAMINOPHEN 10-325 MG PO TABS: 1.0000 | ORAL_TABLET | Freq: Three times a day (TID) | ORAL | Status: DC | PRN
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
## 2019-03-09 NOTE — Progress Notes (Signed)
During the night, the patient complained of severe pain to the bilateral buttocks and hips.  He stated it was burning and aching in nature.  IV Dilaudid 1mg  Q 3 hours was ordered during the day; however, this was not relieving his pain.  It remained a 9/10 to 10/10 for the entire night.  Upon patient's request, we reviewed his medications ordered.  He became very upset when told his Lyrica was 50 mg BID.  He stated he takes 300 mg BID at home.  I explained that due to his renal function, his dosage had to be reduced.  I also discussed this with his aunt over the phone.  He remained very upset and wanted the night time provider called regarding his pain.  Arby Barrette NP called and I received an order for IV Dilaudid 1 mg which was given at 2144 for breakthrough pain.  This did not decrease the pain at all.  Arby Barrette NP called again per patient's request.  Order received for Fentanyl 25 mg Q 2 hours for breakthrough pain.  During the night, Dilaudid and Fentanyl were given without any relief to pain.  Patient remained alert and interactive.  VS remained stable.  Will continue to monitor patient.  Earleen Reaper RN
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
## 2019-03-10 LAB — RENAL FUNCTION PANEL
Albumin: 2.5 g/dL — ABNORMAL LOW (ref 3.5–5.0)
Anion gap: 14 (ref 5–15)
BUN: 45 mg/dL — ABNORMAL HIGH (ref 6–20)
CO2: 23 mmol/L (ref 22–32)
Calcium: 9.2 mg/dL (ref 8.9–10.3)
Chloride: 104 mmol/L (ref 98–111)
Creatinine, Ser: 3.18 mg/dL — ABNORMAL HIGH (ref 0.61–1.24)
GFR calc Af Amer: 28 mL/min — ABNORMAL LOW (ref >60)
GFR calc non Af Amer: 24 mL/min — ABNORMAL LOW (ref >60)
Glucose, Bld: 137 mg/dL — ABNORMAL HIGH (ref 70–99)
Phosphorus: 4 mg/dL (ref 2.5–4.6)
Potassium: 4.6 mmol/L (ref 3.5–5.1)
Sodium: 141 mmol/L (ref 135–145)

## 2019-03-10 LAB — CULTURE, BLOOD (ROUTINE X 2)
Culture: NO GROWTH
Special Requests: ADEQUATE

## 2019-03-10 NOTE — Progress Notes (Signed)
PROGRESS NOTE    Sean Horton  JIR:[id number] DOB: [birth year] DOA: 03/04/2019 PCP: [name], DO    Brief Narrative:  33 y.o. male who has a PMH as outlined below including but not limited to [place] complicated by spinal cord injury with resultant paraplegia and neurogenic bladder and bowel (see "past medical history").  He presented to [facility] ED 4/7 after family called PD for a welfare check since they had not heard from him since 4/5.  PD found him on the floor, unsure how long he had been down for.  Mother reported that urine had been darker than usual. Pt was found to have concerns of rhabdomyolysis with ARF and URI. Patient was admitted to ICU where she was started on CRRT  Assessment & Plan:   Active Problems:   Rhabdomyolysis   Pneumomediastinum (HCC)   Pressure injury of skin  Acute kidney injury due to rhabdomyolysis, suspected ATN due to septic shock and hypoperfusion -Initially had required CRRT -Nephrology continues to follow -Overnight 2200cc urine output -remains alert and oriented -Repeat bmet in AM  Anion gap metabolic acidosis -Resolved -Recheck bmet in AM -Pending CK  Urinary tract infection, pansensitive E. coli -Currently on rocephin, day 6/10 -Remains afebrile  Pneumothorax, pneumomediastinum.  Unclear cause -Undetectable on most recent chest x-ray 4/9, no apparent clinical manifestations. -Continuing to follow clinically and with intermittent chest x-ray per Pulmonary recs -On minimal O2 support currently  Encephalopathy, resolved -Multifactorial likely related to urinary tract infection, med accumulation with renal failure -alert and oriented at this time  Chronic pain, spasticity -Baclofen on hold for now -Lyrica decreased from 200 to 50 mg twice daily per nephrology recommendations given renal function . -Continued on Cymbalta, half his home dose -Continue on long-acting oxycodone 36 mg twice daily as an outpatient, tolerating thus far  -recently compained of "burning" of the hips. Continued on emla cream to affected areas  DVT prophylaxis: Heparin subQ Code Status: Full Family Communication: Pt in room, family not at bedside Disposition Plan: Uncertain at this time  Consultants:   Nephrology  PCCM  Procedures:   CRRT  Antimicrobials: Anti-infectives (From admission, onward)   Start     Dose/Rate Route Frequency Ordered Stop   03/09/19 1000  cephALEXin (KEFLEX) capsule 500 mg     500 mg Oral 2 times daily 03/08/19 1408 03/14/19 0959   03/06/19 1000  cefTRIAXone (ROCEPHIN) 1 g in sodium chloride 0.9 % 100 mL IVPB  Status:  Discontinued     1 g 200 mL/hr over 30 Minutes Intravenous Daily 03/06/19 0816 03/06/19 0820   03/06/19 1000  cefTRIAXone (ROCEPHIN) 2 g in sodium chloride 0.9 % 100 mL IVPB  Status:  Discontinued     2 g 200 mL/hr over 30 Minutes Intravenous Daily 03/06/19 0820 03/08/19 1408   03/05/19 2200  ceFEPIme (MAXIPIME) 1 g in sodium chloride 0.9 % 100 mL IVPB  Status:  Discontinued     1 g 200 mL/hr over 30 Minutes Intravenous Every 24 hours 03/05/19 0029 03/05/19 0207   03/05/19 1000  ceFEPIme (MAXIPIME) 2 g in sodium chloride 0.9 % 100 mL IVPB  Status:  Discontinued     2 g 200 mL/hr over 30 Minutes Intravenous Every 12 hours 03/05/19 0207 03/06/19 0816   03/05/19 0031  vancomycin variable dose per unstable renal function (pharmacist dosing)  Status:  Discontinued      Does not apply See admin instructions 03/05/19 0031 03/05/19 0102   03/04/19 2130  ceFEPIme (  MAXIPIME) 2 g in sodium chloride 0.9 % 100 mL IVPB     2 g 200 mL/hr over 30 Minutes Intravenous  Once 03/04/19 2125 03/04/19 2206   03/04/19 2130  metroNIDAZOLE (FLAGYL) IVPB 500 mg     500 mg 100 mL/hr over 60 Minutes Intravenous  Once 03/04/19 2125 03/04/19 2311   03/04/19 2130  vancomycin (VANCOCIN) IVPB 1000 mg/200 mL premix  Status:  Discontinued     1,000 mg 200 mL/hr over 60 Minutes Intravenous  Once 03/04/19 2125 03/04/19  2126   03/04/19 2130  vancomycin (VANCOCIN) 2,000 mg in sodium chloride 0.9 % 500 mL IVPB     2,000 mg 250 mL/hr over 120 Minutes Intravenous  Once 03/04/19 2126 03/05/19 0124      Subjective: Without complaints. Eager to go home soon  Objective: Vitals:   03/09/19 2151 03/10/19 0223 03/10/19 0543 03/10/19 1058  BP: 127/87  123/72 128/85  Pulse: 65  69 66  Resp: 19  20 16   Temp: 98.3 F (36.8 C)  97.9 F (36.6 C) 98.1 F (36.7 C)  TempSrc: Oral  Oral Oral  SpO2: 100%  95% 99%  Weight: 109.4 kg 109.4 kg    Height:        Intake/Output Summary (Last 24 hours) at 03/10/2019 1351 Last data filed at 03/10/2019 0600 Gross per 24 hour  Intake 1240 ml  Output 1400 ml  Net -160 ml   Filed Weights   03/09/19 0252 03/09/19 2151 03/10/19 0223  Weight: 110.2 kg 109.4 kg 109.4 kg    Examination: General exam: Conversant, in no acute distress Respiratory system: normal chest rise, clear, no audible wheezing Cardiovascular system: regular rhythm, s1-s2 Gastrointestinal system: Nondistended, nontender, pos BS Central nervous system: No seizures, no tremors, paraplegia Extremities: No cyanosis, no joint deformities Skin: No rashes, no pallor Psychiatry: Affect normal // no auditory hallucinations   Data Reviewed: I have personally reviewed following labs and imaging studies  CBC: Recent Labs  Lab 03/04/19 2124 03/05/19 0031 03/05/19 0448 03/06/19 0919 03/07/19 1749  WBC 13.6*  --  10.8* 8.7 8.8  NEUTROABS 12.1*  --   --   --   --   HGB 12.2* 9.9* 9.4* 8.3* 7.7*  HCT 38.8* 29.0* 32.0* 27.4* 26.3*  MCV 76.8*  --  78.0* 75.9* 78.7*  PLT 297  --  232 211 161   Basic Metabolic Panel: Recent Labs  Lab 03/05/19 0448  03/07/19 0803 03/07/19 1749 03/08/19 0239 03/09/19 0648 03/10/19 0411  NA 140   < > 137 135 140 140 141  K 5.3*   < > 4.3 5.4* 4.3 4.6 4.6  CL 111   < > 105 104 106 103 104  CO2 15*   < > 23 20* 24 24 23   GLUCOSE 112*   < > 131* 84 159* 132* 137*  BUN  101*   < > 39* 46* 46* 49* 45*  CREATININE 6.48*   < > 3.53* 3.71* 3.94* 3.51* 3.18*  CALCIUM 7.5*   < > 8.0* 8.3* 8.5* 9.0 9.2  MG 2.3  --   --   --   --   --   --   PHOS 6.6*   < > 3.2 4.1 4.0 3.9 4.0   < > = values in this interval not displayed.   GFR: Estimated Creatinine Clearance: 45.4 mL/min (A) (by C-G formula based on SCr of 3.18 mg/dL (H)). Liver Function Tests: Recent Labs  Lab 03/04/19 2131  03/07/19  4097 03/07/19 1749 03/08/19 0239 03/09/19 0648 03/10/19 0411  AST 69*  --   --   --   --   --   --   ALT 35  --   --   --   --   --   --   ALKPHOS 115  --   --   --   --   --   --   BILITOT 1.4*  --   --   --   --   --   --   PROT 8.2*  --   --   --   --   --   --   ALBUMIN 2.8*   < > 2.0* 2.3* 2.0* 2.3* 2.5*   < > = values in this interval not displayed.   No results for input(s): LIPASE, AMYLASE in the last 168 hours. No results for input(s): AMMONIA in the last 168 hours. Coagulation Profile: No results for input(s): INR, PROTIME in the last 168 hours. Cardiac Enzymes: Recent Labs  Lab 03/04/19 2131 03/05/19 0448 03/06/19 0919 03/07/19 1749 03/09/19 0648  CKTOTAL 4,800* 3,778* 1,917* 1,099* 236   BNP (last 3 results) No results for input(s): PROBNP in the last 8760 hours. HbA1C: No results for input(s): HGBA1C in the last 72 hours. CBG: Recent Labs  Lab 03/05/19 0004  GLUCAP 121*   Lipid Profile: No results for input(s): CHOL, HDL, LDLCALC, TRIG, CHOLHDL, LDLDIRECT in the last 72 hours. Thyroid Function Tests: No results for input(s): TSH, T4TOTAL, FREET4, T3FREE, THYROIDAB in the last 72 hours. Anemia Panel: No results for input(s): VITAMINB12, FOLATE, FERRITIN, TIBC, IRON, RETICCTPCT in the last 72 hours. Sepsis Labs: Recent Labs  Lab 03/04/19 2124 03/04/19 2324  LATICACIDVEN 2.1* 0.9    Recent Results (from the past 240 hour(s))  Blood Culture (routine x 2)     Status: None   Collection Time: 03/04/19  9:23 PM  Result Value Ref Range  Status   Specimen Description BLOOD LEFT HAND  Final   Special Requests   Final    BOTTLES DRAWN AEROBIC AND ANAEROBIC Blood Culture results may not be optimal due to an inadequate volume of blood received in culture bottles   Culture   Final    NO GROWTH 5 DAYS Performed at [facility], [place] [address], [place], [place] [address]    Report Status 03/09/2019 FINAL  Final  Urine culture     Status: Abnormal   Collection Time: 03/04/19  9:24 PM  Result Value Ref Range Status   Specimen Description URINE, RANDOM  Final   Special Requests   Final    NONE Performed at [facility], [place] [address] [place], [place] [address]    Culture >=100,000 COLONIES/mL ESCHERICHIA COLI (A)  Final   Report Status 03/06/2019 FINAL  Final   Organism ID, Bacteria ESCHERICHIA COLI (A)  Final      Susceptibility   Escherichia coli - MIC*    AMPICILLIN <=2 SENSITIVE Sensitive     CEFAZOLIN <=4 SENSITIVE Sensitive     CEFTRIAXONE <=1 SENSITIVE Sensitive     CIPROFLOXACIN <=0.25 SENSITIVE Sensitive     GENTAMICIN <=1 SENSITIVE Sensitive     IMIPENEM <=0.25 SENSITIVE Sensitive     NITROFURANTOIN <=16 SENSITIVE Sensitive     TRIMETH/SULFA <=20 SENSITIVE Sensitive     AMPICILLIN/SULBACTAM <=2 SENSITIVE Sensitive     PIP/TAZO <=4 SENSITIVE Sensitive     Extended ESBL NEGATIVE Sensitive     * >=  100,000 COLONIES/mL ESCHERICHIA COLI  MRSA PCR Screening     Status: None   Collection Time: 03/05/19  2:13 AM  Result Value Ref Range Status   MRSA by PCR NEGATIVE NEGATIVE Final    Comment:        The GeneXpert MRSA Assay (FDA approved for NASAL specimens only), is one component of a comprehensive MRSA colonization surveillance program. It is not intended to diagnose MRSA infection nor to guide or monitor treatment for MRSA infections. Performed at [facility], [place] [address], [place], [place] [address]   Blood Culture (routine x 2)     Status: None   Collection Time: 03/05/19  2:58 AM   Result Value Ref Range Status   Specimen Description BLOOD CENTRAL LINE  Final   Special Requests   Final    BOTTLES DRAWN AEROBIC AND ANAEROBIC Blood Culture adequate volume   Culture   Final    NO GROWTH 5 DAYS Performed at [facility], [address] [place], [place] [address]    Report Status 03/10/2019 FINAL  Final     Radiology Studies: No results found.  Scheduled Meds: . cephALEXin  500 mg Oral BID  . docusate sodium  100 mg Oral BID  . DULoxetine  30 mg Oral BID  . heparin  5,000 Units Subcutaneous Q8H  . mupirocin cream   Topical Daily  . oxyCODONE  35 mg Oral Q12H  . oxyCODONE-acetaminophen  1 tablet Oral Q8H  . pregabalin  50 mg Oral BID   Continuous Infusions: . sodium chloride Stopped (03/05/19 0636)  . sodium chloride       LOS: 5 days   Marylu Lund, MD [facility] Pager On Amion  If 7PM-7AM, please contact night-coverage 03/10/2019, 1:51 PM

## 2019-03-10 NOTE — Progress Notes (Signed)
Patient ID: Sean Horton, male   DOB: Jan 10, 1986, 33 y.o.   MRN: [id number] [facility] Progress Note   Assessment/ Plan:   1. Acute kidney Injury: Likely ATN secondary to septic shock; improving urine output with improving renal function noted overnight.  No acute indications for dialysis at this time.  Suspect renal recovery is underway and will discontinue dialysis catheter in 24 hours if continues to show improvement. 2.  Altered mental status: Encephalopathy from septic shock/azotemia.  Improved status post renal replacement therapy and now with improving renal function. 3.  Septic shock: Suspected to be from urinary source with E. coli seen on cultures; on cephalexin. 4.  Anemia: Without overt loss or indications for PRBC transfusion.  Will add iron studies to a.m. labs. 5.  T3 paraplegia: With history of autonomic dysfunction, continue to monitor.  Subjective:   Reports to be feeling fair, denies chest pain or shortness of breath   Objective:   BP 123/72 (BP Location: Left Arm)   Pulse 69   Temp 97.9 F (36.6 C) (Oral)   Resp 20   Ht 6\' 5"  (1.956 m)   Wt 109.4 kg   SpO2 95%   BMI 28.60 kg/m   Intake/Output Summary (Last 24 hours) at 03/10/2019 1052 Last data filed at 03/10/2019 0600 Gross per 24 hour  Intake 1480 ml  Output 1900 ml  Net -420 ml   Weight change: -0.8 kg  Physical Exam: Gen: Comfortably resting in bed, watching television CVS: Pulse regular rhythm, normal rate, S1 and S2 normal Resp: Clear to auscultation, no rales/rhonchi Abd: Soft, flat, nontender Ext: Bilateral heel protectors in place  Imaging: No results found.  Labs: BMET Recent Labs  Lab 03/06/19 0919 03/06/19 1610 03/07/19 0803 03/07/19 1749 03/08/19 0239 03/09/19 0648 03/10/19 0411  NA 139 138 137 135 140 140 141  K 4.0 3.9 4.3 5.4* 4.3 4.6 4.6  CL 105 104 105 104 106 103 104  CO2 24 25 23  20* 24 24 23   GLUCOSE 107* 99 131* 84 159* 132* 137*  BUN 43* 35* 39* 46*  46* 49* 45*  CREATININE 3.27* 2.83* 3.53* 3.71* 3.94* 3.51* 3.18*  CALCIUM 8.1* 8.1* 8.0* 8.3* 8.5* 9.0 9.2  PHOS 3.0 3.0 3.2 4.1 4.0 3.9 4.0   CBC Recent Labs  Lab 03/04/19 2124 03/05/19 0031 03/05/19 0448 03/06/19 0919 03/07/19 1749  WBC 13.6*  --  10.8* 8.7 8.8  NEUTROABS 12.1*  --   --   --   --   HGB 12.2* 9.9* 9.4* 8.3* 7.7*  HCT 38.8* 29.0* 32.0* 27.4* 26.3*  MCV 76.8*  --  78.0* 75.9* 78.7*  PLT 297  --  232 211 218    Medications:    . cephALEXin  500 mg Oral BID  . docusate sodium  100 mg Oral BID  . DULoxetine  30 mg Oral BID  . heparin  5,000 Units Subcutaneous Q8H  . mupirocin cream   Topical Daily  . oxyCODONE  35 mg Oral Q12H  . oxyCODONE-acetaminophen  1 tablet Oral Q8H  . pregabalin  50 mg Oral BID    Elmarie Shiley, MD 03/10/2019, 10:52 AM

## 2019-03-11 LAB — RENAL FUNCTION PANEL
Albumin: 2.6 g/dL — ABNORMAL LOW (ref 3.5–5.0)
Anion gap: 13 (ref 5–15)
BUN: 43 mg/dL — ABNORMAL HIGH (ref 6–20)
CO2: 23 mmol/L (ref 22–32)
Calcium: 9.3 mg/dL (ref 8.9–10.3)
Chloride: 105 mmol/L (ref 98–111)
Creatinine, Ser: 2.89 mg/dL — ABNORMAL HIGH (ref 0.61–1.24)
GFR calc Af Amer: 32 mL/min — ABNORMAL LOW (ref >60)
GFR calc non Af Amer: 27 mL/min — ABNORMAL LOW (ref >60)
Glucose, Bld: 154 mg/dL — ABNORMAL HIGH (ref 70–99)
Phosphorus: 4.2 mg/dL (ref 2.5–4.6)
Potassium: 4.8 mmol/L (ref 3.5–5.1)
Sodium: 141 mmol/L (ref 135–145)

## 2019-03-11 LAB — CBC
HCT: 26.1 % — ABNORMAL LOW (ref 39.0–52.0)
Hemoglobin: 7.9 g/dL — ABNORMAL LOW (ref 13.0–17.0)
MCH: 23.9 pg — ABNORMAL LOW (ref 26.0–34.0)
MCHC: 30.3 g/dL (ref 30.0–36.0)
MCV: 79.1 fL — ABNORMAL LOW (ref 80.0–100.0)
Platelets: 297 10*3/uL (ref 150–400)
RBC: 3.3 MIL/uL — ABNORMAL LOW (ref 4.22–5.81)
RDW: 17.6 % — ABNORMAL HIGH (ref 11.5–15.5)
WBC: 9.1 10*3/uL (ref 4.0–10.5)
nRBC: 0 % (ref 0.0–0.2)

## 2019-03-11 LAB — POCT ACTIVATED CLOTTING TIME: Activated Clotting Time: 136 seconds

## 2019-03-11 MED ORDER — PREGABALIN 50 MG PO CAPS
50.0000 mg | ORAL_CAPSULE | Freq: Two times a day (BID) | ORAL | Status: DC
  Administered 2019-03-11 (×2): 50 mg via ORAL
  Filled 2019-03-11: qty 1

## 2019-03-11 MED ORDER — OXYCODONE-ACETAMINOPHEN 5-325 MG PO TABS: 1.0000 | ORAL_TABLET | ORAL | Status: DC | PRN

## 2019-03-11 MED ORDER — BACLOFEN 10 MG PO TABS
20.0000 mg | ORAL_TABLET | Freq: Two times a day (BID) | ORAL | Status: DC
  Administered 2019-03-11 – 2019-03-12 (×3): 20 mg via ORAL
  Filled 2019-03-11 (×3): qty 2

## 2019-03-11 MED ORDER — PREGABALIN 100 MG PO CAPS: 300.0000 mg | ORAL_CAPSULE | Freq: Two times a day (BID) | ORAL | Status: DC

## 2019-03-11 MED ORDER — PREGABALIN 100 MG PO CAPS
100.0000 mg | ORAL_CAPSULE | Freq: Two times a day (BID) | ORAL | Status: DC
  Administered 2019-03-11 – 2019-03-12 (×2): 100 mg via ORAL
  Filled 2019-03-11 (×2): qty 1

## 2019-03-11 MED ORDER — HYDROMORPHONE HCL 1 MG/ML IJ SOLN
1.0000 mg | INTRAMUSCULAR | Status: DC | PRN
  Administered 2019-03-11 – 2019-03-12 (×5): 1 mg via INTRAVENOUS
  Filled 2019-03-11 (×5): qty 1

## 2019-03-11 NOTE — Progress Notes (Signed)
Patient ID: Sean Horton, male   DOB: 1986/09/16, 33 y.o.   MRN: [id number] [facility] Progress Note   Assessment/ Plan:   1. Acute kidney Injury: Likely ATN secondary to septic shock.  With evidence of renal recovery overnight with better urine output and downtrending creatinine.  Discontinue dialysis catheter at this time.  Anticipate ability to discharge home in 24 hours if continues to show renal recovery. 2.  Altered mental status: Encephalopathy from septic shock/azotemia.  Improved status post renal replacement therapy and now with improving renal function. 3.  Septic shock: Suspected to be from urinary source with E. coli seen on cultures; on cephalexin. 4.  Anemia: Without overt loss or indications for PRBC transfusion.  Will add iron studies to a.m. labs. 5.  T3 paraplegia: With history of autonomic dysfunction, continue to monitor.  Subjective:   Reports to had severe pain overnight due to limitations/renal adjustment of analgesics.   Objective:   BP (!) 142/94 (BP Location: Left Arm)   Pulse 80   Temp 97.8 F (36.6 C) (Oral)   Resp 18   Ht 6\' 5"  (1.956 m)   Wt 109.4 kg   SpO2 100%   BMI 28.60 kg/m   Intake/Output Summary (Last 24 hours) at 03/11/2019 0951 Last data filed at 03/11/2019 0600 Gross per 24 hour  Intake 100 ml  Output 2500 ml  Net -2400 ml   Weight change:   Physical Exam: Gen: Appears somewhat uncomfortable resting in bed. CVS: Pulse regular rhythm, normal rate, S1 and S2 normal Resp: Clear to auscultation, no rales/rhonchi Abd: Soft, flat, nontender Ext: Bilateral heel protectors in place  Imaging: No results found.  Labs: BMET Recent Labs  Lab 03/06/19 1610 03/07/19 0803 03/07/19 1749 03/08/19 0239 03/09/19 0648 03/10/19 0411 03/11/19 0435  NA 138 137 135 140 140 141 141  K 3.9 4.3 5.4* 4.3 4.6 4.6 4.8  CL 104 105 104 106 103 104 105  CO2 25 23 20* 24 24 23 23   GLUCOSE 99 131* 84 159* 132* 137* 154*  BUN 35* 39* 46*  46* 49* 45* 43*  CREATININE 2.83* 3.53* 3.71* 3.94* 3.51* 3.18* 2.89*  CALCIUM 8.1* 8.0* 8.3* 8.5* 9.0 9.2 9.3  PHOS 3.0 3.2 4.1 4.0 3.9 4.0 4.2   CBC Recent Labs  Lab 03/04/19 2124  03/05/19 0448 03/06/19 0919 03/07/19 1749 03/11/19 0435  WBC 13.6*  --  10.8* 8.7 8.8 9.1  NEUTROABS 12.1*  --   --   --   --   --   HGB 12.2*   < > 9.4* 8.3* 7.7* 7.9*  HCT 38.8*   < > 32.0* 27.4* 26.3* 26.1*  MCV 76.8*  --  78.0* 75.9* 78.7* 79.1*  PLT 297  --  232 211 218 297   < > = values in this interval not displayed.   Medications:    . cephALEXin  500 mg Oral BID  . docusate sodium  100 mg Oral BID  . DULoxetine  30 mg Oral BID  . heparin  5,000 Units Subcutaneous Q8H  . mupirocin cream   Topical Daily  . oxyCODONE  35 mg Oral Q12H  . oxyCODONE-acetaminophen  1 tablet Oral Q8H  . pregabalin  50 mg Oral BID   Elmarie Shiley, MD 03/11/2019, 9:51 AM

## 2019-03-11 NOTE — Progress Notes (Addendum)
PROGRESS NOTE    Sean Horton  WER:[id number] DOB: [birth year] DOA: 03/04/2019 PCP: [name], DO    Brief Narrative:  33 y.o. male who has a PMH as outlined below including but not limited to [place] complicated by spinal cord injury with resultant paraplegia and neurogenic bladder and bowel (see "past medical history").  He presented to [facility] ED 4/7 after family called PD for a welfare check since they had not heard from him since 4/5.  PD found him on the floor, unsure how long he had been down for.  Mother reported that urine had been darker than usual. Pt was found to have concerns of rhabdomyolysis with ARF and URI. Patient was admitted to ICU where she was started on CRRT  Assessment & Plan:   Active Problems:   Rhabdomyolysis   Pneumomediastinum (HCC)   Pressure injury of skin  Acute kidney injury due to rhabdomyolysis, suspected ATN due to septic shock and hypoperfusion -Initially had required CRRT -Nephrology continues to follow -Overnight 2200cc urine output -remains alert and oriented -Recheck bmet in AM -discussed with Nephrology. If renal function continues to improve, possible d/c tomorrow  Anion gap metabolic acidosis -Resolved -Repeat bmet in AM  Urinary tract infection, pansensitive E. coli -Currently on rocephin, day 8/10.  -Afebrile  Pneumothorax, pneumomediastinum.  Unclear cause -Undetectable on most recent chest x-ray 4/9, no apparent clinical manifestations. -Continuing to follow clinically and with intermittent chest x-ray per Pulmonary recs -Remains on minimal O2 support currently  Encephalopathy, resolved -Multifactorial likely related to urinary tract infection, med accumulation with renal failure -alert and oriented. Stable  Chronic pain, spasticity -Baclofen on hold for now -Lyrica initially decreased from 200 to 50 mg twice daily per nephrology recommendations given renal function. With improving renal function, increase lyrical to 100mg   bid and will resume baclofen at lower dose of 20mg  bid . -Continued on Cymbalta, half his home dose -Continue on long-acting oxycodone 36 mg twice daily as an outpatient, tolerating thus far -Stable  DVT prophylaxis: Heparin subQ Code Status: Full Family Communication: Pt in room, family not at bedside Disposition Plan: Possible home in 24hrs  Consultants:   Nephrology  PCCM  Procedures:   CRRT  Antimicrobials: Anti-infectives (From admission, onward)   Start     Dose/Rate Route Frequency Ordered Stop   03/09/19 1000  cephALEXin (KEFLEX) capsule 500 mg     500 mg Oral 2 times daily 03/08/19 1408 03/14/19 0959   03/06/19 1000  cefTRIAXone (ROCEPHIN) 1 g in sodium chloride 0.9 % 100 mL IVPB  Status:  Discontinued     1 g 200 mL/hr over 30 Minutes Intravenous Daily 03/06/19 0816 03/06/19 0820   03/06/19 1000  cefTRIAXone (ROCEPHIN) 2 g in sodium chloride 0.9 % 100 mL IVPB  Status:  Discontinued     2 g 200 mL/hr over 30 Minutes Intravenous Daily 03/06/19 0820 03/08/19 1408   03/05/19 2200  ceFEPIme (MAXIPIME) 1 g in sodium chloride 0.9 % 100 mL IVPB  Status:  Discontinued     1 g 200 mL/hr over 30 Minutes Intravenous Every 24 hours 03/05/19 0029 03/05/19 0207   03/05/19 1000  ceFEPIme (MAXIPIME) 2 g in sodium chloride 0.9 % 100 mL IVPB  Status:  Discontinued     2 g 200 mL/hr over 30 Minutes Intravenous Every 12 hours 03/05/19 0207 03/06/19 0816   03/05/19 0031  vancomycin variable dose per unstable renal function (pharmacist dosing)  Status:  Discontinued  Does not apply See admin instructions 03/05/19 0031 03/05/19 0102   03/04/19 2130  ceFEPIme (MAXIPIME) 2 g in sodium chloride 0.9 % 100 mL IVPB     2 g 200 mL/hr over 30 Minutes Intravenous  Once 03/04/19 2125 03/04/19 2206   03/04/19 2130  metroNIDAZOLE (FLAGYL) IVPB 500 mg     500 mg 100 mL/hr over 60 Minutes Intravenous  Once 03/04/19 2125 03/04/19 2311   03/04/19 2130  vancomycin (VANCOCIN) IVPB 1000 mg/200 mL  premix  Status:  Discontinued     1,000 mg 200 mL/hr over 60 Minutes Intravenous  Once 03/04/19 2125 03/04/19 2126   03/04/19 2130  vancomycin (VANCOCIN) 2,000 mg in sodium chloride 0.9 % 500 mL IVPB     2,000 mg 250 mL/hr over 120 Minutes Intravenous  Once 03/04/19 2126 03/05/19 0124      Subjective: Complaining of marked pain. Wanting home meds resumed  Objective: Vitals:   03/10/19 1700 03/10/19 2032 03/11/19 0556 03/11/19 0945  BP: 114/74 120/83 130/84 (!) 142/94  Pulse: 68 72 66 80  Resp: 16 18 18 18   Temp: 98.1 F (36.7 C) 98.1 F (36.7 C) 98.2 F (36.8 C) 97.8 F (36.6 C)  TempSrc: Oral Oral Oral Oral  SpO2: 98% 98% 98% 100%  Weight:      Height:        Intake/Output Summary (Last 24 hours) at 03/11/2019 1258 Last data filed at 03/11/2019 1006 Gross per 24 hour  Intake 100 ml  Output 3100 ml  Net -3000 ml   Filed Weights   03/09/19 0252 03/09/19 2151 03/10/19 0223  Weight: 110.2 kg 109.4 kg 109.4 kg    Examination: General exam: Conversant, in no acute distress Respiratory system: normal chest rise, clear, no audible wheezing Cardiovascular system: regular rhythm, s1-s2 Gastrointestinal system: Nondistended, nontender, pos BS Central nervous system: No seizures, no tremors, hx paraplegia Extremities: No cyanosis, no joint deformities Skin: No rashes, no pallor Psychiatry: Affect normal // no auditory hallucinations   Data Reviewed: I have personally reviewed following labs and imaging studies  CBC: Recent Labs  Lab 03/04/19 2124 03/05/19 0031 03/05/19 0448 03/06/19 0919 03/07/19 1749 03/11/19 0435  WBC 13.6*  --  10.8* 8.7 8.8 9.1  NEUTROABS 12.1*  --   --   --   --   --   HGB 12.2* 9.9* 9.4* 8.3* 7.7* 7.9*  HCT 38.8* 29.0* 32.0* 27.4* 26.3* 26.1*  MCV 76.8*  --  78.0* 75.9* 78.7* 79.1*  PLT 297  --  232 211 218 680   Basic Metabolic Panel: Recent Labs  Lab 03/05/19 0448  03/07/19 1749 03/08/19 0239 03/09/19 0648 03/10/19 0411 03/11/19  0435  NA 140   < > 135 140 140 141 141  K 5.3*   < > 5.4* 4.3 4.6 4.6 4.8  CL 111   < > 104 106 103 104 105  CO2 15*   < > 20* 24 24 23 23   GLUCOSE 112*   < > 84 159* 132* 137* 154*  BUN 101*   < > 46* 46* 49* 45* 43*  CREATININE 6.48*   < > 3.71* 3.94* 3.51* 3.18* 2.89*  CALCIUM 7.5*   < > 8.3* 8.5* 9.0 9.2 9.3  MG 2.3  --   --   --   --   --   --   PHOS 6.6*   < > 4.1 4.0 3.9 4.0 4.2   < > = values in this interval not displayed.  GFR: Estimated Creatinine Clearance: 50 mL/min (A) (by C-G formula based on SCr of 2.89 mg/dL (H)). Liver Function Tests: Recent Labs  Lab 03/04/19 2131  03/07/19 1749 03/08/19 0239 03/09/19 0648 03/10/19 0411 03/11/19 0435  AST 69*  --   --   --   --   --   --   ALT 35  --   --   --   --   --   --   ALKPHOS 115  --   --   --   --   --   --   BILITOT 1.4*  --   --   --   --   --   --   PROT 8.2*  --   --   --   --   --   --   ALBUMIN 2.8*   < > 2.3* 2.0* 2.3* 2.5* 2.6*   < > = values in this interval not displayed.   No results for input(s): LIPASE, AMYLASE in the last 168 hours. No results for input(s): AMMONIA in the last 168 hours. Coagulation Profile: No results for input(s): INR, PROTIME in the last 168 hours. Cardiac Enzymes: Recent Labs  Lab 03/04/19 2131 03/05/19 0448 03/06/19 0919 03/07/19 1749 03/09/19 0648  CKTOTAL 4,800* 3,778* 1,917* 1,099* 236   BNP (last 3 results) No results for input(s): PROBNP in the last 8760 hours. HbA1C: No results for input(s): HGBA1C in the last 72 hours. CBG: Recent Labs  Lab 03/05/19 0004  GLUCAP 121*   Lipid Profile: No results for input(s): CHOL, HDL, LDLCALC, TRIG, CHOLHDL, LDLDIRECT in the last 72 hours. Thyroid Function Tests: No results for input(s): TSH, T4TOTAL, FREET4, T3FREE, THYROIDAB in the last 72 hours. Anemia Panel: No results for input(s): VITAMINB12, FOLATE, FERRITIN, TIBC, IRON, RETICCTPCT in the last 72 hours. Sepsis Labs: Recent Labs  Lab 03/04/19 2124 03/04/19  2324  LATICACIDVEN 2.1* 0.9    Recent Results (from the past 240 hour(s))  Blood Culture (routine x 2)     Status: None   Collection Time: 03/04/19  9:23 PM  Result Value Ref Range Status   Specimen Description BLOOD LEFT HAND  Final   Special Requests   Final    BOTTLES DRAWN AEROBIC AND ANAEROBIC Blood Culture results may not be optimal due to an inadequate volume of blood received in culture bottles   Culture   Final    NO GROWTH 5 DAYS Performed at [facility], [place] [address], [place], [place] [address]    Report Status 03/09/2019 FINAL  Final  Urine culture     Status: Abnormal   Collection Time: 03/04/19  9:24 PM  Result Value Ref Range Status   Specimen Description URINE, RANDOM  Final   Special Requests   Final    NONE Performed at [facility], [place] [address] [place], [place] [address]    Culture >=100,000 COLONIES/mL ESCHERICHIA COLI (A)  Final   Report Status 03/06/2019 FINAL  Final   Organism ID, Bacteria ESCHERICHIA COLI (A)  Final      Susceptibility   Escherichia coli - MIC*    AMPICILLIN <=2 SENSITIVE Sensitive     CEFAZOLIN <=4 SENSITIVE Sensitive     CEFTRIAXONE <=1 SENSITIVE Sensitive     CIPROFLOXACIN <=0.25 SENSITIVE Sensitive     GENTAMICIN <=1 SENSITIVE Sensitive     IMIPENEM <=0.25 SENSITIVE Sensitive     NITROFURANTOIN <=16 SENSITIVE Sensitive     TRIMETH/SULFA <=20 SENSITIVE  Sensitive     AMPICILLIN/SULBACTAM <=2 SENSITIVE Sensitive     PIP/TAZO <=4 SENSITIVE Sensitive     Extended ESBL NEGATIVE Sensitive     * >=100,000 COLONIES/mL ESCHERICHIA COLI  MRSA PCR Screening     Status: None   Collection Time: 03/05/19  2:13 AM  Result Value Ref Range Status   MRSA by PCR NEGATIVE NEGATIVE Final    Comment:        The GeneXpert MRSA Assay (FDA approved for NASAL specimens only), is one component of a comprehensive MRSA colonization surveillance program. It is not intended to diagnose MRSA infection nor to guide or monitor  treatment for MRSA infections. Performed at [facility], [place] [address], [place], [place] [address]   Blood Culture (routine x 2)     Status: None   Collection Time: 03/05/19  2:58 AM  Result Value Ref Range Status   Specimen Description BLOOD CENTRAL LINE  Final   Special Requests   Final    BOTTLES DRAWN AEROBIC AND ANAEROBIC Blood Culture adequate volume   Culture   Final    NO GROWTH 5 DAYS Performed at [facility], [address], [place], [place] [address]    Report Status 03/10/2019 FINAL  Final     Radiology Studies: No results found.  Scheduled Meds: . baclofen  20 mg Oral BID  . cephALEXin  500 mg Oral BID  . docusate sodium  100 mg Oral BID  . DULoxetine  30 mg Oral BID  . heparin  5,000 Units Subcutaneous Q8H  . mupirocin cream   Topical Daily  . oxyCODONE  35 mg Oral Q12H  . pregabalin  100 mg Oral BID   Continuous Infusions: . sodium chloride Stopped (03/05/19 0636)  . sodium chloride       LOS: 6 days   Marylu Lund, MD [facility] Pager On Amion  If 7PM-7AM, please contact night-coverage 03/11/2019, 12:58 PM

## 2019-03-12 LAB — RENAL FUNCTION PANEL
Albumin: 2.8 g/dL — ABNORMAL LOW (ref 3.5–5.0)
Anion gap: 13 (ref 5–15)
BUN: 39 mg/dL — ABNORMAL HIGH (ref 6–20)
CO2: 24 mmol/L (ref 22–32)
Calcium: 9.6 mg/dL (ref 8.9–10.3)
Chloride: 104 mmol/L (ref 98–111)
Creatinine, Ser: 2.46 mg/dL — ABNORMAL HIGH (ref 0.61–1.24)
GFR calc Af Amer: 38 mL/min — ABNORMAL LOW (ref >60)
GFR calc non Af Amer: 33 mL/min — ABNORMAL LOW (ref >60)
Glucose, Bld: 147 mg/dL — ABNORMAL HIGH (ref 70–99)
Phosphorus: 4.4 mg/dL (ref 2.5–4.6)
Potassium: 4.9 mmol/L (ref 3.5–5.1)
Sodium: 141 mmol/L (ref 135–145)

## 2019-03-12 MED ORDER — PREGABALIN 100 MG PO CAPS: 100.0000 mg | ORAL_CAPSULE | Freq: Two times a day (BID) | ORAL | 0 refills | Status: DC

## 2019-03-12 MED ORDER — BACLOFEN 20 MG PO TABS: 20.0000 mg | ORAL_TABLET | Freq: Two times a day (BID) | ORAL | 0 refills | Status: DC

## 2019-03-12 MED ORDER — CEPHALEXIN 500 MG PO CAPS: 500.0000 mg | ORAL_CAPSULE | Freq: Two times a day (BID) | ORAL | 0 refills | Status: AC

## 2019-03-12 MED FILL — CEPHALEXIN 500 MG CAPSULE: 500 | 2 days supply | Qty: 4 | Fill #0

## 2019-03-12 NOTE — TOC Initial Note (Signed)
Transition of Care [facility]) - Initial/Assessment Note    Patient Details  Name: Sean Horton MRN: [id number] Date of Birth: Aug 03, 1986  Transition [facility] CM/SW Contact:    Bartholomew Crews, RN Phone Number: [phone] 03/12/2019, 10:40 AM  Clinical Narrative:                 Spoke with patient at bedside. Patient to transition home today. States he will need ambulance transport. Does not have his wheelchair at hospital. Usually uses a wheelchair transportation service other than [facility]. Will arrange [facility] transportation when ready. Patient verbalized no home needs. Has PCP-Dr. Vanetta Shawl with Family Medicine. Also followed by pain management. Mother and sister assist as needed. States that he is good with his self care.  No other transition of care needs identified.   Expected Discharge Plan: Home/Self Care Barriers to Discharge: No Barriers Identified   Patient Goals and CMS Choice     Choice offered to / list presented to : NA  Expected Discharge Plan and Services Expected Discharge Plan: Home/Self Care In-house Referral: NA Discharge Planning Services: CM Consult Post Acute Care Choice: NA Living arrangements for the past 2 months: Apartment Expected Discharge Date: 03/12/19               DME Arranged: N/A DME Agency: NA HH Arranged: NA [place] Agency: NA  Prior Living Arrangements/Services Living arrangements for the past 2 months: Apartment Lives with:: Self Patient language and need for interpreter reviewed:: Yes Do you feel safe going back to the place where you live?: Yes      Need for Family Participation in Patient Care: Yes (Comment) Care giver support system in place?: Yes (comment) Current home services: DME Criminal Activity/Legal Involvement Pertinent to Current Situation/Hospitalization: No - Comment as needed  Activities of Daily Living Home Assistive Devices/Equipment: Wheelchair ADL Screening (condition at time of admission) Patient's cognitive ability  adequate to safely complete daily activities?: Yes Is the patient deaf or have difficulty hearing?: No Does the patient have difficulty seeing, even when wearing glasses/contacts?: No Does the patient have difficulty concentrating, remembering, or making decisions?: No Patient able to express need for assistance with ADLs?: Yes Does the patient have difficulty dressing or bathing?: Yes Independently performs ADLs?: No Communication: Independent Does the patient have difficulty walking or climbing stairs?: Yes Weakness of Legs: Both Weakness of Arms/Hands: None  Permission Sought/Granted                  Emotional Assessment Appearance:: Appears stated age Attitude/Demeanor/Rapport: Engaged Affect (typically observed): Happy Orientation: : Oriented to Self, Oriented to  Time, Oriented to Place, Oriented to Situation   Psych Involvement: No (comment)  Admission diagnosis:  Chronic indwelling Foley catheter [Z96.0] Paraplegia following spinal cord injury ([place]) [G82.20] Septic shock ([place]) [A41.9, R65.21] Traumatic rhabdomyolysis, initial encounter ([place]) [T79.6XXA] Hypotension, unspecified hypotension type [I95.9] Acute renal failure, unspecified acute renal failure type ([place]) [N17.9] Altered mental status, unspecified altered mental status type [R41.82] Pneumothorax, unspecified type [J93.9] Patient Active Problem List   Diagnosis Date Noted  . Rhabdomyolysis 03/05/2019  . Pressure injury of skin 03/05/2019  . Acute renal failure ([place])   . Altered mental status   . Pneumothorax   . Pneumomediastinum ([place])   . Hematuria 11/18/2018  . Anemia in other chronic diseases classified elsewhere 11/18/2018  . Sepsis ([place]) 11/17/2018  . Osteomyelitis of pelvic region ([place])   . Osteomyelitis ([place]) 07/19/2018  . Sacral decubitus ulcer 07/19/2018  . Cellulitis  of hip, left 07/16/2018  . Absolute anemia   . Wound infection 05/15/2017  . Recurrent UTI 05/09/2017  . Bladder spasm  04/10/2017  . Open upper arm wound 07/21/2016  . Chronic indwelling Foley catheter 07/21/2016  . Chronic pain 07/18/2015  . Non-healing ulcer, multiple sites. 07/18/2015  . Reflex sympathetic dystrophy 10/14/2009  . 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 HYPERTENSION, SYSTOLIC 67/54/4920  . Allergic rhinitis 02/27/2009  . PERIPHERAL EDEMA 02/25/2009  . UNSPECIFIED HYPOTENSION 03/19/2008  . Neurogenic bladder 03/19/2008  . PALPITATIONS, RECURRENT 03/18/2008  . Paraplegia following spinal cord injury ([place]) 10/01/2007  . GERD 10/01/2007  . HEADACHE 10/01/2007   PCP:  [name], DO Pharmacy:   [facility] [place] [facility] [place] [address] [place] [address] Phone: [phone] Fax: [phone]  [facility] [place] [place] - [address] [place] [address] Phone: [phone] Fax: [phone]     Social Determinants of Health (SDOH) Interventions    Readmission Risk Interventions No flowsheet data found.
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
## 2019-03-12 NOTE — Progress Notes (Signed)
Patient ID: Sean Horton, male   DOB: 1986/09/26, 33 y.o.   MRN: [id number] [facility] Progress Note   Assessment/ Plan:   1. Acute kidney Injury: Likely ATN secondary to septic shock.  Continues to have decent urine output and improving renal function based on labs without any indications for intervention.  Hemodialysis catheter discontinued yesterday.  I expect that he will continue to have recovery at this point and may be able to discharge him with outpatient follow-up with his PCP for labs in 1 to 2 weeks.  Reminded him to avoid NSAIDs. 2.  Altered mental status: Encephalopathy from septic shock/azotemia.  Improved status post renal replacement therapy and now with improving renal function. 3.  Septic shock: Suspected to be from urinary source with E. coli seen on cultures; on cephalexin. 4.  Anemia: Without overt loss or indications for PRBC transfusion.  Will add iron studies to a.m. labs. 5.  T3 paraplegia: With history of autonomic dysfunction, continue to monitor.  Will sign off at this time, recommend follow-up with PCP in 1 to 2 weeks assess for continued renal recovery.  Subjective:   Reports to feeling better and has questions about limiting renal risk factors.   Objective:   BP 120/87 (BP Location: Left Arm)   Pulse 68   Temp 97.7 F (36.5 C) (Oral)   Resp 18   Ht 6\' 5"  (1.956 m)   Wt 109.4 kg   SpO2 100%   BMI 28.60 kg/m   Intake/Output Summary (Last 24 hours) at 03/12/2019 1013 Last data filed at 03/12/2019 1011 Gross per 24 hour  Intake 120 ml  Output 2200 ml  Net -2080 ml   Weight change:   Physical Exam: Gen: Comfortably resting in bed, case management at bedside. CVS: Pulse regular rhythm, normal rate, S1 and S2 normal Resp: Clear to auscultation, no rales/rhonchi Abd: Soft, flat, nontender Ext: Bilateral heel protectors in place  Imaging: No results found.  Labs: BMET Recent Labs  Lab 03/07/19 0803 03/07/19 1749 03/08/19 0239  03/09/19 0648 03/10/19 0411 03/11/19 0435 03/12/19 0549  NA 137 135 140 140 141 141 141  K 4.3 5.4* 4.3 4.6 4.6 4.8 4.9  CL 105 104 106 103 104 105 104  CO2 23 20* 24 24 23 23 24   GLUCOSE 131* 84 159* 132* 137* 154* 147*  BUN 39* 46* 46* 49* 45* 43* 39*  CREATININE 3.53* 3.71* 3.94* 3.51* 3.18* 2.89* 2.46*  CALCIUM 8.0* 8.3* 8.5* 9.0 9.2 9.3 9.6  PHOS 3.2 4.1 4.0 3.9 4.0 4.2 4.4   CBC Recent Labs  Lab 03/06/19 0919 03/07/19 1749 03/11/19 0435  WBC 8.7 8.8 9.1  HGB 8.3* 7.7* 7.9*  HCT 27.4* 26.3* 26.1*  MCV 75.9* 78.7* 79.1*  PLT 211 218 297   Medications:    . baclofen  20 mg Oral BID  . cephALEXin  500 mg Oral BID  . docusate sodium  100 mg Oral BID  . DULoxetine  30 mg Oral BID  . heparin  5,000 Units Subcutaneous Q8H  . mupirocin cream   Topical Daily  . oxyCODONE  35 mg Oral Q12H  . pregabalin  100 mg Oral BID   Elmarie Shiley, MD 03/12/2019, 10:13 AM

## 2019-03-17 ENCOUNTER — Telehealth: Payer: Self-pay | Admitting: *Deleted

## 2019-03-17 NOTE — Telephone Encounter (Signed)
Pt calls requesting an order be sent to [facility] for his air mattress on his bed.  They will have to take it back since he is not on a wound vac anymore unless an order is received from MD per pt.  Will forward to MD.  Christen Bame, CMA

## 2019-03-19 ENCOUNTER — Telehealth: Payer: Self-pay | Admitting: Family Medicine

## 2019-03-19 NOTE — Telephone Encounter (Signed)
LMOVM for [facility] to call me back about air cushion for patient.   It seems that we faxed back that CMN form on 02/12/19.  Will verify when they call back.  If they did not receive, will ask them to send again to email as our fax machine is broken.  Christen Bame, CMA

## 2019-03-19 NOTE — Telephone Encounter (Signed)
Tiffanie from [facility] called about a form for pt's CMN for wheelchair. Please give a call back.

## 2019-03-21 NOTE — Telephone Encounter (Signed)
Called and spoke with Katie @ [facility].  She will check into it and refax order if they do not have.  LMOVM for pt to return call so that we could keep him updated. Christen Bame, CMA

## 2019-03-21 NOTE — Telephone Encounter (Signed)
Tiffanie called back.  Form in [place] box.  [place] signed and Page faxed back Christen Bame, CMA

## 2019-03-25 NOTE — Telephone Encounter (Signed)
Per pt, [facility] told him that insurance will not pay for an air mattress unless he has an open non healing wound but, could maybe have him keep it longer if he had a HH RN come out to assess his wound.  Pt is requesting a referral.  Spoke with Melissa @ [facility].  He likely will not qualify for a [facility] RN but would need a referral and face 2 face before they could submit it.    Pt is agreeable to OV next thursday. Christen Bame, CMA

## 2019-03-25 NOTE — Telephone Encounter (Signed)
Pt calls back to let us know that he has receive the air cushion and is very appreciative.  Christen Bame, CMA

## 2019-03-26 NOTE — Telephone Encounter (Signed)
Sean Horton will see on Thursday for face to face

## 2019-04-03 ENCOUNTER — Ambulatory Visit: Payer: Medicaid Other | Admitting: Family Medicine

## 2019-04-11 ENCOUNTER — Ambulatory Visit: Payer: Medicaid Other | Admitting: Family Medicine

## 2019-04-14 ENCOUNTER — Telehealth (INDEPENDENT_AMBULATORY_CARE_PROVIDER_SITE_OTHER): Payer: Medicaid Other | Admitting: Family Medicine

## 2019-04-14 ENCOUNTER — Other Ambulatory Visit: Payer: Self-pay

## 2019-04-14 DIAGNOSIS — L98492 Non-pressure chronic ulcer of skin of other sites with fat layer exposed: Secondary | ICD-10-CM

## 2019-04-14 DIAGNOSIS — G5621 Lesion of ulnar nerve, right upper limb: Secondary | ICD-10-CM

## 2019-04-14 DIAGNOSIS — M86452 Chronic osteomyelitis with draining sinus, left femur: Secondary | ICD-10-CM

## 2019-04-14 DIAGNOSIS — G8220 Paraplegia, unspecified: Secondary | ICD-10-CM

## 2019-04-14 NOTE — Progress Notes (Signed)
WT/-230lb T- N/a BP-n/a
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
## 2019-04-22 ENCOUNTER — Telehealth: Payer: Self-pay | Admitting: *Deleted

## 2019-04-22 DIAGNOSIS — Z978 Presence of other specified devices: Secondary | ICD-10-CM

## 2019-04-22 DIAGNOSIS — S41102A Unspecified open wound of left upper arm, initial encounter: Secondary | ICD-10-CM

## 2019-04-22 DIAGNOSIS — N319 Neuromuscular dysfunction of bladder, unspecified: Secondary | ICD-10-CM

## 2019-04-22 DIAGNOSIS — N390 Urinary tract infection, site not specified: Secondary | ICD-10-CM

## 2019-04-22 NOTE — Telephone Encounter (Signed)
Pt calls for 2 reasons:  1. He wants to know if Dr. Vanetta Shawl will call him in a script for abx.  He states that he has a UTI because he is having bladder spasms and headaches.  2. He has not heard from [facility]  Spoke with Kennyth Lose (ref coordinator) insurance will not pay for anymore home health unless he has a new or different issue.  Will call pt back and let him know.   Please call and let him know about abx decision. Christen Bame, CMA

## 2019-04-23 ENCOUNTER — Telehealth: Payer: Self-pay

## 2019-04-23 MED ORDER — CIPROFLOXACIN HCL 500 MG PO TABS: 500.0000 mg | ORAL_TABLET | Freq: Two times a day (BID) | ORAL | 0 refills | Status: DC

## 2019-04-23 NOTE — Telephone Encounter (Signed)
Prescription has been cancelled and sent to correct pharmacy.

## 2019-04-23 NOTE — Telephone Encounter (Signed)
Pt called nurse line stating her needs his antibiotic sent to [facility] on [facility]. Vanetta Shawl has already sent this to [facility] in [place]. I will change for patient. He has been made aware.

## 2019-04-23 NOTE — Telephone Encounter (Signed)
  Called Mr. Hasz to discuss.   1. UTI- will refer to urology for recurrent UTIs and indwelling Foley. In mean time will send in rx for 10 days of cipro given patient's symptoms consistent with new infection and would like to try to avoid hospitalization. Per last culture data patient typically grows pan sensitive ecoli. Hospital precautions reviewed. 2. Home health- unsure why they are refusing to see patient. He has an open wound on his left arm that needs attention. Will attempt to get them out again for this open wound and wound care.  3. Social support- will ask Casimer Lanius to reach out to patient to provide additional support  Lucila Maine, DO PGY-3, [facility] 04/23/2019 9:50 AM

## 2019-04-24 ENCOUNTER — Telehealth: Payer: Self-pay | Admitting: Licensed Clinical Social Worker

## 2019-04-24 NOTE — Telephone Encounter (Signed)
Care Coordination  Telephone Outreach Note 04/24/2019  Type of Service: General Social Work Consult via phone Start time: 11:45   End time: 12:00 Total time: 15 minutes  Name: WEYLYN RICCIUTI MRN: [id number] DOB: [birth year] Confirmed patient's name: Yes    Confirmed patient's date of birth  Yes   FREEDOM PEDDY is a 33 y.o. male referred by Dr. Vanetta Shawl for concerns with personal care needs.  Review of patient's consultants notes from appropriate care team members was performed as part of care coordination referral.   LCSW called patient to assess needs and barriers with personal care needs and transportation . Patient lives alone and is on a fixed income.   Issues discussed: support system, community resource options, how patient is managing now and has managed in the past, concerns with not being able to keep the air mattress, and talking with insurance about the therapeutic mattress.   ASSESSMENT: Patient reports no needs at this time.  He tried personal care services but does not believe he has an ongoing need so he discontinued the services.  Patient utilizes [facility] [employer] for all of his transportation needs also discussed medicaid transportation as a back up option. INTERVENTION: Client interviewed and appropriate assessments performed.  SDOH (Social Determinants of Health) screening also performed today.  challenges identified: None Plan:   1. Patient will explore more information about the therapeutic mattress  2.  Patient will call office if any needs or concerns arise.  [name], DO has been notified of this outreach and Mr. Axcel J Heinsohn's plan.   Casimer Lanius, [place]   [phone] 12:12 PM

## 2019-04-26 ENCOUNTER — Other Ambulatory Visit: Payer: Self-pay

## 2019-04-26 ENCOUNTER — Emergency Department (HOSPITAL_COMMUNITY)
Admission: EM | Admit: 2019-04-26 | Discharge: 2019-04-26 | Disposition: A | Discharge status: Home / Self Care | Payer: Medicaid Other | Attending: Emergency Medicine | Admitting: Emergency Medicine

## 2019-04-26 DIAGNOSIS — R519 Headache, unspecified: Secondary | ICD-10-CM

## 2019-04-26 DIAGNOSIS — F17290 Nicotine dependence, other tobacco product, uncomplicated: Secondary | ICD-10-CM | POA: Insufficient documentation

## 2019-04-26 DIAGNOSIS — Z79899 Other long term (current) drug therapy: Secondary | ICD-10-CM | POA: Insufficient documentation

## 2019-04-26 DIAGNOSIS — I10 Essential (primary) hypertension: Secondary | ICD-10-CM | POA: Insufficient documentation

## 2019-04-26 DIAGNOSIS — R51 Headache: Secondary | ICD-10-CM | POA: Insufficient documentation

## 2019-04-26 LAB — COMPREHENSIVE METABOLIC PANEL
ALT: 17 U/L (ref 0–44)
AST: 24 U/L (ref 15–41)
Albumin: 4.1 g/dL (ref 3.5–5.0)
Alkaline Phosphatase: 144 U/L — ABNORMAL HIGH (ref 38–126)
Anion gap: 17 — ABNORMAL HIGH (ref 5–15)
BUN: 32 mg/dL — ABNORMAL HIGH (ref 6–20)
CO2: 19 mmol/L — ABNORMAL LOW (ref 22–32)
Calcium: 10.7 mg/dL — ABNORMAL HIGH (ref 8.9–10.3)
Chloride: 101 mmol/L (ref 98–111)
Creatinine, Ser: 1.2 mg/dL (ref 0.61–1.24)
GFR calc Af Amer: >60 mL/min (ref >60)
GFR calc non Af Amer: >60 mL/min (ref >60)
Glucose, Bld: 156 mg/dL — ABNORMAL HIGH (ref 70–99)
Potassium: 5.6 mmol/L — ABNORMAL HIGH (ref 3.5–5.1)
Sodium: 137 mmol/L (ref 135–145)
Total Bilirubin: UNDETERMINED mg/dL (ref 0.3–1.2)
Total Protein: 10.3 g/dL — ABNORMAL HIGH (ref 6.5–8.1)

## 2019-04-26 LAB — CBC WITH DIFFERENTIAL/PLATELET
Abs Immature Granulocytes: 0.05 10*3/uL (ref 0.00–0.07)
Basophils Absolute: 0 10*3/uL (ref 0.0–0.1)
Basophils Relative: 0 %
Eosinophils Absolute: 0 10*3/uL (ref 0.0–0.5)
Eosinophils Relative: 0 %
HCT: 45.8 % (ref 39.0–52.0)
Hemoglobin: 14.3 g/dL (ref 13.0–17.0)
Immature Granulocytes: 1 %
Lymphocytes Relative: 13 %
Lymphs Abs: 1.2 10*3/uL (ref 0.7–4.0)
MCH: 25.2 pg — ABNORMAL LOW (ref 26.0–34.0)
MCHC: 31.2 g/dL (ref 30.0–36.0)
MCV: 80.6 fL (ref 80.0–100.0)
Monocytes Absolute: 0.6 10*3/uL (ref 0.1–1.0)
Monocytes Relative: 7 %
Neutro Abs: 7.1 10*3/uL (ref 1.7–7.7)
Neutrophils Relative %: 79 %
Platelets: 343 10*3/uL (ref 150–400)
RBC: 5.68 MIL/uL (ref 4.22–5.81)
RDW: 16.4 % — ABNORMAL HIGH (ref 11.5–15.5)
WBC: 9 10*3/uL (ref 4.0–10.5)
nRBC: 0 % (ref 0.0–0.2)

## 2019-04-26 LAB — LIPASE, BLOOD: Lipase: 22 U/L (ref 11–51)

## 2019-04-26 MED ORDER — METOCLOPRAMIDE HCL 5 MG/ML IJ SOLN
10.0000 mg | Freq: Once | INTRAMUSCULAR | Status: DC
  Filled 2019-04-26: qty 2

## 2019-04-26 MED ORDER — DIPHENHYDRAMINE HCL 25 MG PO CAPS
25.0000 mg | ORAL_CAPSULE | Freq: Once | ORAL | Status: AC
  Administered 2019-04-26: 25 mg via ORAL
  Filled 2019-04-26: qty 1

## 2019-04-26 MED ORDER — ACETAMINOPHEN 325 MG PO TABS
650.0000 mg | ORAL_TABLET | Freq: Once | ORAL | Status: AC
  Administered 2019-04-26: 650 mg via ORAL
  Filled 2019-04-26: qty 2

## 2019-04-26 MED ORDER — DEXAMETHASONE SODIUM PHOSPHATE 10 MG/ML IJ SOLN
10.0000 mg | Freq: Once | INTRAMUSCULAR | Status: DC
  Filled 2019-04-26: qty 1

## 2019-04-26 MED ORDER — DEXAMETHASONE SODIUM PHOSPHATE 10 MG/ML IJ SOLN
10.0000 mg | Freq: Once | INTRAMUSCULAR | Status: AC
  Administered 2019-04-26: 16:00:00 10 mg via INTRAMUSCULAR
  Filled 2019-04-26: qty 1

## 2019-04-26 MED ORDER — METOCLOPRAMIDE HCL 5 MG/ML IJ SOLN
10.0000 mg | Freq: Once | INTRAMUSCULAR | Status: AC
  Administered 2019-04-26: 10 mg via INTRAMUSCULAR
  Filled 2019-04-26: qty 2

## 2019-04-26 MED ORDER — DIPHENHYDRAMINE HCL 50 MG/ML IJ SOLN
25.0000 mg | Freq: Once | INTRAMUSCULAR | Status: DC
  Filled 2019-04-26: qty 1

## 2019-04-26 MED ORDER — FENTANYL CITRATE (PF) 100 MCG/2ML IJ SOLN
12.5000 ug | Freq: Once | INTRAMUSCULAR | Status: AC
  Administered 2019-04-26: 19:00:00 12.5 ug via INTRAMUSCULAR
  Filled 2019-04-26: qty 2

## 2019-04-26 MED ORDER — SODIUM CHLORIDE 0.9 % IV BOLUS: 1000.0000 mL | Freq: Once | INTRAVENOUS | Status: DC

## 2019-04-26 NOTE — ED Triage Notes (Signed)
Patient has had an intermittent migraine, stomach spasms and high blood pressure for about a week.

## 2019-04-26 NOTE — ED Notes (Signed)
Patient verbalized understanding of discharge instructions. Opportunities for questioning and answers were provided. Armband removed by staff. Patient removed from ED.   

## 2019-04-26 NOTE — Discharge Instructions (Addendum)
Today you were evaluated at the emergency department for a headache.  1. Medications: continue usual home medications 2. Treatment: rest, drink plenty of fluids, if headache persists take tylenol as directed on the bottle. 3. Follow Up: Please followup with your primary doctor in 3 days and neurology within 1 week for discussion of your diagnoses and further evaluation after today's visit; if you do not have a primary care doctor use the resource guide provided to find one; Please return to the ER for double vision, speech difficulty, persistent vomiting or other concerns.  Neurologists in the area are: [facility] and [facility].  Suggest you call to schedule an outpatient appointment, if needing a referral you can get that through your primary care doctor.  Headache:  You are having a headache. No specific cause was found today for your headache. It may have been a migraine or other cause of headache. Stress, anxiety, fatigue, and depression are common triggers for headaches. Your headache today does not appear to be life-threatening or require hospitalization, but often the exact cause of headaches is not determined in the emergency department. Therefore, followup with your doctor is very important to find out what may have caused your headache, and whether or not you need any further diagnostic testing or treatment. Sometimes headaches can appear benign but then more serious symptoms can develop which should prompt an immediate reevaluation by your doctor or the emergency department.  Hydration: Have a goal of about a half liter of water every couple hours to stay well hydrated.   Sleep: Please be sure to get plenty of sleep with a goal of 8 hours per night. Having a regular bed time and bedtime routine can help with this.  Screens: Reduce the amount of time you are in front of screens.  Take about a 5-10-minute break every hour or every couple hours to give your eyes rest.  Do not  use screens in dark rooms.  Glasses with a blue light filter may also help reduce eye fatigue.  Stress: Take steps to reduce stress as much as possible.   Seek immediate medical attention if:  You develop possible problems with medications prescribed. The medications don't resolve your headache, if it recurs, or if you have multiple episodes of vomiting or can't take fluids by mouth You have a change from the usual headache. If you developed a sudden severe headache or confusion, become poorly responsive or faint, developed a fever above 100.4 or problems breathing, have a change in speech, vision, swallowing or understanding, or developed new weakness, numbness, tingling, incoordination or have a seizure.
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
## 2019-04-26 NOTE — ED Notes (Signed)
IV team could not successfully start an IV on the patient. Informed the EDP. The plan is to give IM injections of medications and get PO fluids.

## 2019-05-05 ENCOUNTER — Telehealth: Payer: Self-pay | Admitting: Neurology

## 2019-05-05 NOTE — Telephone Encounter (Signed)
Hey Dr. Felecia Shelling. We've received an internal referral on pt for Paraplegia following spinal cord injury and Entrapment of right ulnar nerve. I wasn't sure if this should come to [place] or neurosurgery. Could you please review?

## 2019-05-06 NOTE — Telephone Encounter (Signed)
The spinal cord injury is old so I think the referral is more for the ulnar nerve.  He can be placed on anyone's schedule

## 2019-05-13 ENCOUNTER — Other Ambulatory Visit: Payer: Self-pay | Admitting: Family Medicine

## 2019-05-14 ENCOUNTER — Other Ambulatory Visit: Payer: Self-pay | Admitting: Family Medicine

## 2019-05-14 ENCOUNTER — Encounter: Payer: Self-pay | Admitting: Family Medicine

## 2019-05-14 ENCOUNTER — Ambulatory Visit (INDEPENDENT_AMBULATORY_CARE_PROVIDER_SITE_OTHER): Payer: Medicaid Other | Admitting: Family Medicine

## 2019-05-14 ENCOUNTER — Other Ambulatory Visit: Payer: Self-pay

## 2019-05-14 VITALS — BP 118/68 | HR 96

## 2019-05-14 DIAGNOSIS — K5904 Chronic idiopathic constipation: Secondary | ICD-10-CM

## 2019-05-14 DIAGNOSIS — G5622 Lesion of ulnar nerve, left upper limb: Secondary | ICD-10-CM | POA: Diagnosis not present

## 2019-05-14 DIAGNOSIS — G904 Autonomic dysreflexia: Secondary | ICD-10-CM

## 2019-05-14 MED ORDER — SUMATRIPTAN SUCCINATE 25 MG PO TABS: 25.0000 mg | ORAL_TABLET | ORAL | 1 refills | Status: DC | PRN

## 2019-05-14 MED ORDER — PREGABALIN 200 MG PO CAPS: 200.0000 mg | ORAL_CAPSULE | Freq: Two times a day (BID) | ORAL | 0 refills | Status: DC

## 2019-05-14 MED ORDER — HYDRALAZINE HCL 25 MG PO TABS: 25.0000 mg | ORAL_TABLET | Freq: Three times a day (TID) | ORAL | 0 refills | Status: DC | PRN

## 2019-05-14 MED ORDER — POLYETHYLENE GLYCOL 3350 17 GM/SCOOP PO POWD: 17.0000 g | Freq: Every day | ORAL | 2 refills | Status: DC

## 2019-05-14 NOTE — Patient Instructions (Addendum)
   Let's do daily miralax 1 capful in water to help have soft BM every day.   Take hydralazine when you get a headache and BP is high. If no better within 20-30 minutes, take sumatriptan for headache  Ultimately we need you to see neurology, I put in a new referral urgently so you should be called within several days for an appointment.  Check out [email or web address] To look at options for air mattresses   Lucila Maine, DO PGY-3, [facility] 05/14/2019 4:41 PM

## 2019-05-14 NOTE — Progress Notes (Signed)
    Subjective:    Patient ID: BRAIDAN RICCIARDI, male    DOB: [birth year], 33 y.o.   MRN: [id number]   CC: headaches  HPI: Daily headaches on right temple pounding, painful 10/10 and can last anywhere from 1 minute to 30 minutes. If headache lasts longer than that he feels it is bowel movement related and will have a BM or notice urine passing in his catheter and headache improves. Tried tylenol without relief.  Went to ED for it, they couldn't get an IV on him but gave him fentanyl with relief.   He reports his blood pressure is always elevated when he gets a headache as high as 220/130.   BM every other day every 2 days. Just drinks water to help him go. Mom started him on dulcolax. Hospital gave him miralax but he hasn't used it. Eating well, no nausea no vomiting. Started probiotics 3-4 days ago. Taking oxybutynin  Was supposed to see urology yesterday, rescheduled for July 9th as he could not afford the charge for transportation there.  Seeing neuro 7/21 for ulnar nerve. He called them to move appt up to discuss headaches as he feels this is autonomic dysreflexia and they were unable to accommodate him.  Smoking status reviewed- current smoker  Review of Systems- see HPI   Objective:  BP 118/68   Pulse 96   SpO2 95%  Vitals and nursing note reviewed  General: well nourished, in no acute distress HEENT: normocephalic, MMM Cardiac: regular rate Respiratory: no increased work of breathing Extremities: no edema or cyanosis Neuro: alert and oriented, wheelchair bound   Assessment & Plan:   1. Autonomic dysreflexia Urgent referral to neuro made. Tried to call them to move up appt but they needed a new referral. Added hydral to take for headache when BP is elevated. Treat constipation as below. Sumatriptan given to use if no improvement in headache. - Ambulatory referral to Neurology  2. Chronic idiopathic constipation miralax daily, continue dulcolax  3. Impingement of left  ulnar nerve Increase lyrica to 200 mg BID- this was reduced in hospital stay due to renal function but renal function returned to baseline   Return in about 3 months (around 08/14/2019), or if symptoms worsen or fail to improve.   Lucila Maine, DO Family Medicine Resident PGY-3

## 2019-05-15 ENCOUNTER — Telehealth: Payer: Self-pay | Admitting: *Deleted

## 2019-05-15 NOTE — Telephone Encounter (Signed)
So he's been on this medication for years and I just refilled it for him- I believe pain management typically fills it. I believe he's tried the other medications and failed

## 2019-05-15 NOTE — Telephone Encounter (Signed)
Thank you :)

## 2019-05-15 NOTE — Telephone Encounter (Signed)
Received fax from [facility] requesting prior authorization of pregabalin.  See formulary below and let me know if you want to change med or pursue PA      Christen Bame, CMA

## 2019-05-15 NOTE — Telephone Encounter (Signed)
Prior approval for pregbalin completed via [employer].  Med approved for 05/15/2019 - 05/14/2020.  Prior approval # [id number]. Pharmacy informed.  Christen Bame, CMA

## 2019-05-17 ENCOUNTER — Other Ambulatory Visit: Payer: Self-pay

## 2019-05-17 ENCOUNTER — Encounter (HOSPITAL_COMMUNITY): Payer: Self-pay | Admitting: Emergency Medicine

## 2019-05-17 ENCOUNTER — Inpatient Hospital Stay (HOSPITAL_COMMUNITY)
Admission: EM | Admit: 2019-05-17 | Discharge: 2019-05-19 | DRG: 699 | Disposition: A | Discharge status: Home / Self Care | Payer: Medicaid Other | Attending: Family Medicine | Admitting: Family Medicine

## 2019-05-17 DIAGNOSIS — N390 Urinary tract infection, site not specified: Secondary | ICD-10-CM

## 2019-05-17 DIAGNOSIS — K219 Gastro-esophageal reflux disease without esophagitis: Secondary | ICD-10-CM | POA: Diagnosis present

## 2019-05-17 DIAGNOSIS — G8220 Paraplegia, unspecified: Secondary | ICD-10-CM | POA: Diagnosis present

## 2019-05-17 DIAGNOSIS — E860 Dehydration: Secondary | ICD-10-CM | POA: Diagnosis present

## 2019-05-17 DIAGNOSIS — N319 Neuromuscular dysfunction of bladder, unspecified: Secondary | ICD-10-CM | POA: Diagnosis present

## 2019-05-17 DIAGNOSIS — Z20828 Contact with and (suspected) exposure to other viral communicable diseases: Secondary | ICD-10-CM | POA: Diagnosis present

## 2019-05-17 DIAGNOSIS — I959 Hypotension, unspecified: Secondary | ICD-10-CM | POA: Diagnosis present

## 2019-05-17 DIAGNOSIS — L89302 Pressure ulcer of unspecified buttock, stage 2: Secondary | ICD-10-CM | POA: Diagnosis present

## 2019-05-17 DIAGNOSIS — L89159 Pressure ulcer of sacral region, unspecified stage: Secondary | ICD-10-CM | POA: Diagnosis present

## 2019-05-17 DIAGNOSIS — T83518A Infection and inflammatory reaction due to other urinary catheter, initial encounter: Principal | ICD-10-CM | POA: Diagnosis present

## 2019-05-17 DIAGNOSIS — G8929 Other chronic pain: Secondary | ICD-10-CM | POA: Diagnosis present

## 2019-05-17 DIAGNOSIS — N179 Acute kidney failure, unspecified: Secondary | ICD-10-CM | POA: Diagnosis present

## 2019-05-17 DIAGNOSIS — F17210 Nicotine dependence, cigarettes, uncomplicated: Secondary | ICD-10-CM | POA: Diagnosis present

## 2019-05-17 DIAGNOSIS — E785 Hyperlipidemia, unspecified: Secondary | ICD-10-CM | POA: Diagnosis present

## 2019-05-17 DIAGNOSIS — S41102D Unspecified open wound of left upper arm, subsequent encounter: Secondary | ICD-10-CM

## 2019-05-17 MED ORDER — SODIUM CHLORIDE 0.9 % IV BOLUS (SEPSIS)
1000.0000 mL | Freq: Once | INTRAVENOUS | Status: AC
  Administered 2019-05-18: 1000 mL via INTRAVENOUS

## 2019-05-17 NOTE — ED Triage Notes (Signed)
Pt present with feeling he has another UTI d/t feeling bad all over. Pt does have foley in place, pt falling asleep in chair during triage. Pt denies fever, denies n/v/d.

## 2019-05-18 DIAGNOSIS — N179 Acute kidney failure, unspecified: Secondary | ICD-10-CM | POA: Diagnosis present

## 2019-05-18 DIAGNOSIS — L89302 Pressure ulcer of unspecified buttock, stage 2: Secondary | ICD-10-CM | POA: Diagnosis present

## 2019-05-18 DIAGNOSIS — G8220 Paraplegia, unspecified: Secondary | ICD-10-CM

## 2019-05-18 DIAGNOSIS — K219 Gastro-esophageal reflux disease without esophagitis: Secondary | ICD-10-CM | POA: Diagnosis present

## 2019-05-18 DIAGNOSIS — T83518A Infection and inflammatory reaction due to other urinary catheter, initial encounter: Secondary | ICD-10-CM | POA: Diagnosis not present

## 2019-05-18 DIAGNOSIS — E860 Dehydration: Secondary | ICD-10-CM | POA: Diagnosis present

## 2019-05-18 DIAGNOSIS — S41102D Unspecified open wound of left upper arm, subsequent encounter: Secondary | ICD-10-CM | POA: Diagnosis not present

## 2019-05-18 DIAGNOSIS — E785 Hyperlipidemia, unspecified: Secondary | ICD-10-CM | POA: Diagnosis present

## 2019-05-18 DIAGNOSIS — L89152 Pressure ulcer of sacral region, stage 2: Secondary | ICD-10-CM | POA: Diagnosis not present

## 2019-05-18 DIAGNOSIS — N319 Neuromuscular dysfunction of bladder, unspecified: Secondary | ICD-10-CM | POA: Diagnosis present

## 2019-05-18 DIAGNOSIS — N390 Urinary tract infection, site not specified: Secondary | ICD-10-CM | POA: Diagnosis present

## 2019-05-18 DIAGNOSIS — G8929 Other chronic pain: Secondary | ICD-10-CM | POA: Diagnosis present

## 2019-05-18 DIAGNOSIS — Z20828 Contact with and (suspected) exposure to other viral communicable diseases: Secondary | ICD-10-CM | POA: Diagnosis present

## 2019-05-18 DIAGNOSIS — F17210 Nicotine dependence, cigarettes, uncomplicated: Secondary | ICD-10-CM | POA: Diagnosis present

## 2019-05-18 LAB — COMPREHENSIVE METABOLIC PANEL
ALT: 15 U/L (ref 0–44)
ALT: 15 U/L (ref 0–44)
AST: 23 U/L (ref 15–41)
AST: 19 U/L (ref 15–41)
Albumin: 3.5 g/dL (ref 3.5–5.0)
Albumin: 3.1 g/dL — ABNORMAL LOW (ref 3.5–5.0)
Alkaline Phosphatase: 112 U/L (ref 38–126)
Alkaline Phosphatase: 102 U/L (ref 38–126)
Anion gap: 12 (ref 5–15)
Anion gap: 10 (ref 5–15)
BUN: 49 mg/dL — ABNORMAL HIGH (ref 6–20)
BUN: 45 mg/dL — ABNORMAL HIGH (ref 6–20)
CO2: 20 mmol/L — ABNORMAL LOW (ref 22–32)
CO2: 20 mmol/L — ABNORMAL LOW (ref 22–32)
Calcium: 9.5 mg/dL (ref 8.9–10.3)
Calcium: 8.8 mg/dL — ABNORMAL LOW (ref 8.9–10.3)
Chloride: 105 mmol/L (ref 98–111)
Chloride: 109 mmol/L (ref 98–111)
Creatinine, Ser: 2.6 mg/dL — ABNORMAL HIGH (ref 0.61–1.24)
Creatinine, Ser: 2.31 mg/dL — ABNORMAL HIGH (ref 0.61–1.24)
GFR calc Af Amer: 36 mL/min — ABNORMAL LOW (ref >60)
GFR calc Af Amer: 41 mL/min — ABNORMAL LOW (ref >60)
GFR calc non Af Amer: 31 mL/min — ABNORMAL LOW (ref >60)
GFR calc non Af Amer: 36 mL/min — ABNORMAL LOW (ref >60)
Glucose, Bld: 95 mg/dL (ref 70–99)
Glucose, Bld: 183 mg/dL — ABNORMAL HIGH (ref 70–99)
Potassium: 5.4 mmol/L — ABNORMAL HIGH (ref 3.5–5.1)
Potassium: 4.6 mmol/L (ref 3.5–5.1)
Sodium: 137 mmol/L (ref 135–145)
Sodium: 139 mmol/L (ref 135–145)
Total Bilirubin: 0.6 mg/dL (ref 0.3–1.2)
Total Bilirubin: 0.5 mg/dL (ref 0.3–1.2)
Total Protein: 7.5 g/dL (ref 6.5–8.1)
Total Protein: 7 g/dL (ref 6.5–8.1)

## 2019-05-18 LAB — CBC
HCT: 35 % — ABNORMAL LOW (ref 39.0–52.0)
Hemoglobin: 10.7 g/dL — ABNORMAL LOW (ref 13.0–17.0)
MCH: 25.8 pg — ABNORMAL LOW (ref 26.0–34.0)
MCHC: 30.6 g/dL (ref 30.0–36.0)
MCV: 84.3 fL (ref 80.0–100.0)
Platelets: 256 10*3/uL (ref 150–400)
RBC: 4.15 MIL/uL — ABNORMAL LOW (ref 4.22–5.81)
RDW: 15.7 % — ABNORMAL HIGH (ref 11.5–15.5)
WBC: 9.8 10*3/uL (ref 4.0–10.5)
nRBC: 0 % (ref 0.0–0.2)

## 2019-05-18 LAB — LIPID PANEL
Cholesterol: 242 mg/dL — ABNORMAL HIGH (ref 0–200)
HDL: 34 mg/dL — ABNORMAL LOW (ref >40)
LDL Cholesterol: 156 mg/dL — ABNORMAL HIGH (ref 0–99)
Total CHOL/HDL Ratio: 7.1 RATIO
Triglycerides: 258 mg/dL — ABNORMAL HIGH (ref <150)
VLDL: 52 mg/dL — ABNORMAL HIGH (ref 0–40)

## 2019-05-18 LAB — URINALYSIS, ROUTINE W REFLEX MICROSCOPIC
Bilirubin Urine: NEGATIVE
Glucose, UA: NEGATIVE mg/dL
Ketones, ur: NEGATIVE mg/dL
Nitrite: NEGATIVE
Protein, ur: >=300 mg/dL — AB
RBC / HPF: >50 RBC/hpf — ABNORMAL HIGH (ref 0–5)
Specific Gravity, Urine: 1.008 (ref 1.005–1.030)
WBC, UA: >50 WBC/hpf — ABNORMAL HIGH (ref 0–5)
pH: 5 (ref 5.0–8.0)

## 2019-05-18 LAB — CBC WITH DIFFERENTIAL/PLATELET
Abs Immature Granulocytes: 0.02 10*3/uL (ref 0.00–0.07)
Basophils Absolute: 0.1 10*3/uL (ref 0.0–0.1)
Basophils Relative: 1 %
Eosinophils Absolute: 0.1 10*3/uL (ref 0.0–0.5)
Eosinophils Relative: 2 %
HCT: 42.3 % (ref 39.0–52.0)
Hemoglobin: 12.4 g/dL — ABNORMAL LOW (ref 13.0–17.0)
Immature Granulocytes: 0 %
Lymphocytes Relative: 30 %
Lymphs Abs: 2.7 10*3/uL (ref 0.7–4.0)
MCH: 26.2 pg (ref 26.0–34.0)
MCHC: 29.3 g/dL — ABNORMAL LOW (ref 30.0–36.0)
MCV: 89.2 fL (ref 80.0–100.0)
Monocytes Absolute: 0.9 10*3/uL (ref 0.1–1.0)
Monocytes Relative: 10 %
Neutro Abs: 5.3 10*3/uL (ref 1.7–7.7)
Neutrophils Relative %: 57 %
Platelets: 276 10*3/uL (ref 150–400)
RBC: 4.74 MIL/uL (ref 4.22–5.81)
RDW: 15.8 % — ABNORMAL HIGH (ref 11.5–15.5)
WBC: 9.1 10*3/uL (ref 4.0–10.5)
nRBC: 0 % (ref 0.0–0.2)

## 2019-05-18 LAB — LACTIC ACID, PLASMA: Lactic Acid, Venous: 1.1 mmol/L (ref 0.5–1.9)

## 2019-05-18 LAB — CK: Total CK: 109 U/L (ref 49–397)

## 2019-05-18 LAB — SARS CORONAVIRUS 2 BY RT PCR (HOSPITAL ORDER, PERFORMED IN ~~LOC~~ HOSPITAL LAB): SARS Coronavirus 2: NEGATIVE

## 2019-05-18 MED ORDER — HEPARIN SODIUM (PORCINE) 5000 UNIT/ML IJ SOLN
5000.0000 [IU] | Freq: Three times a day (TID) | INTRAMUSCULAR | Status: DC
  Administered 2019-05-18 – 2019-05-19 (×3): 5000 [IU] via SUBCUTANEOUS
  Filled 2019-05-18 (×3): qty 1

## 2019-05-18 MED ORDER — FOLIC ACID 1 MG PO TABS
1.0000 mg | ORAL_TABLET | Freq: Every day | ORAL | Status: DC
  Administered 2019-05-18 – 2019-05-19 (×2): 1 mg via ORAL
  Filled 2019-05-18 (×2): qty 1

## 2019-05-18 MED ORDER — ACETAMINOPHEN 650 MG RE SUPP: 650.0000 mg | Freq: Four times a day (QID) | RECTAL | Status: DC | PRN

## 2019-05-18 MED ORDER — ENSURE ENLIVE PO LIQD
237.0000 mL | Freq: Three times a day (TID) | ORAL | Status: DC
  Administered 2019-05-18 – 2019-05-19 (×3): 237 mL via ORAL
  Filled 2019-05-18: qty 237

## 2019-05-18 MED ORDER — OXYCODONE HCL ER 10 MG PO T12A
40.0000 mg | EXTENDED_RELEASE_TABLET | Freq: Two times a day (BID) | ORAL | Status: DC
  Administered 2019-05-18 – 2019-05-19 (×3): 40 mg via ORAL
  Filled 2019-05-18 (×3): qty 4

## 2019-05-18 MED ORDER — ZINC SULFATE 220 (50 ZN) MG PO CAPS
220.0000 mg | ORAL_CAPSULE | Freq: Every day | ORAL | Status: DC
  Administered 2019-05-18 – 2019-05-19 (×2): 220 mg via ORAL
  Filled 2019-05-18 (×2): qty 1

## 2019-05-18 MED ORDER — DULOXETINE HCL 60 MG PO CPEP
60.0000 mg | ORAL_CAPSULE | Freq: Two times a day (BID) | ORAL | Status: DC
  Administered 2019-05-18 – 2019-05-19 (×3): 60 mg via ORAL
  Filled 2019-05-18 (×4): qty 1

## 2019-05-18 MED ORDER — SODIUM CHLORIDE 0.9 % IV SOLN
1.0000 g | Freq: Once | INTRAVENOUS | Status: AC
  Administered 2019-05-18: 01:00:00 1 g via INTRAVENOUS
  Filled 2019-05-18: qty 10

## 2019-05-18 MED ORDER — BACLOFEN 10 MG PO TABS
20.0000 mg | ORAL_TABLET | Freq: Two times a day (BID) | ORAL | Status: DC
  Administered 2019-05-18 – 2019-05-19 (×3): 20 mg via ORAL
  Filled 2019-05-18: qty 1
  Filled 2019-05-18: qty 2
  Filled 2019-05-18: qty 1

## 2019-05-18 MED ORDER — SODIUM CHLORIDE 0.9 % IV SOLN
1.0000 g | Freq: Every day | INTRAVENOUS | Status: DC
  Administered 2019-05-18: 1 g via INTRAVENOUS
  Filled 2019-05-18: qty 10
  Filled 2019-05-18: qty 1

## 2019-05-18 MED ORDER — OXYCODONE-ACETAMINOPHEN 10-325 MG PO TABS: 1.0000 | ORAL_TABLET | Freq: Three times a day (TID) | ORAL | Status: DC | PRN

## 2019-05-18 MED ORDER — SUMATRIPTAN SUCCINATE 25 MG PO TABS: 25.0000 mg | ORAL_TABLET | ORAL | Status: DC | PRN

## 2019-05-18 MED ORDER — ONDANSETRON HCL 4 MG/2ML IJ SOLN: 4.0000 mg | Freq: Four times a day (QID) | INTRAMUSCULAR | Status: DC | PRN

## 2019-05-18 MED ORDER — SODIUM CHLORIDE 0.9 % IV SOLN
INTRAVENOUS | Status: AC
  Administered 2019-05-18 (×3): via INTRAVENOUS

## 2019-05-18 MED ORDER — FERROUS SULFATE 325 (65 FE) MG PO TABS
325.0000 mg | ORAL_TABLET | Freq: Two times a day (BID) | ORAL | Status: DC
  Administered 2019-05-18 – 2019-05-19 (×3): 325 mg via ORAL
  Filled 2019-05-18 (×3): qty 1

## 2019-05-18 MED ORDER — ACETAMINOPHEN 325 MG PO TABS: 650.0000 mg | ORAL_TABLET | Freq: Four times a day (QID) | ORAL | Status: DC | PRN

## 2019-05-18 MED ORDER — ONDANSETRON HCL 4 MG PO TABS: 4.0000 mg | ORAL_TABLET | Freq: Four times a day (QID) | ORAL | Status: DC | PRN

## 2019-05-18 MED ORDER — OXYCODONE-ACETAMINOPHEN 5-325 MG PO TABS: 1.0000 | ORAL_TABLET | Freq: Three times a day (TID) | ORAL | Status: DC | PRN

## 2019-05-18 MED ORDER — POLYETHYLENE GLYCOL 3350 17 G PO PACK
17.0000 g | PACK | Freq: Every day | ORAL | Status: DC
  Administered 2019-05-18 – 2019-05-19 (×2): 17 g via ORAL
  Filled 2019-05-18 (×2): qty 1

## 2019-05-18 MED ORDER — ACETAMINOPHEN 325 MG PO TABS
650.0000 mg | ORAL_TABLET | Freq: Four times a day (QID) | ORAL | Status: DC | PRN
  Administered 2019-05-18: 650 mg via ORAL
  Filled 2019-05-18: qty 2

## 2019-05-18 MED ORDER — OXYCODONE HCL 5 MG PO TABS: 5.0000 mg | ORAL_TABLET | Freq: Three times a day (TID) | ORAL | Status: DC | PRN

## 2019-05-18 MED ORDER — PREGABALIN 100 MG PO CAPS
100.0000 mg | ORAL_CAPSULE | Freq: Two times a day (BID) | ORAL | Status: DC
  Administered 2019-05-18 – 2019-05-19 (×3): 100 mg via ORAL
  Filled 2019-05-18 (×3): qty 1

## 2019-05-18 NOTE — ED Notes (Signed)
Pt. Updated continued NPO status. Per Dr. Jonelle Sidle
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
## 2019-05-18 NOTE — ED Notes (Addendum)
Pt. Update regarding admission. No family notification needed at this time.

## 2019-05-18 NOTE — ED Notes (Signed)
Pt was wiggling around in bed and this NT found pts IV out. Pt cleaned up, old IV site dressed. Linens and gown changed.
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
## 2019-05-19 DIAGNOSIS — S41102D Unspecified open wound of left upper arm, subsequent encounter: Secondary | ICD-10-CM

## 2019-05-19 LAB — BASIC METABOLIC PANEL
Anion gap: 8 (ref 5–15)
BUN: 43 mg/dL — ABNORMAL HIGH (ref 6–20)
CO2: 20 mmol/L — ABNORMAL LOW (ref 22–32)
Calcium: 9.1 mg/dL (ref 8.9–10.3)
Chloride: 113 mmol/L — ABNORMAL HIGH (ref 98–111)
Creatinine, Ser: 1.51 mg/dL — ABNORMAL HIGH (ref 0.61–1.24)
GFR calc Af Amer: >60 mL/min (ref >60)
GFR calc non Af Amer: 60 mL/min — ABNORMAL LOW (ref >60)
Glucose, Bld: 134 mg/dL — ABNORMAL HIGH (ref 70–99)
Potassium: 4.7 mmol/L (ref 3.5–5.1)
Sodium: 141 mmol/L (ref 135–145)

## 2019-05-19 LAB — URINE CULTURE

## 2019-05-19 LAB — CBC
HCT: 31.4 % — ABNORMAL LOW (ref 39.0–52.0)
Hemoglobin: 9.7 g/dL — ABNORMAL LOW (ref 13.0–17.0)
MCH: 26.1 pg (ref 26.0–34.0)
MCHC: 30.9 g/dL (ref 30.0–36.0)
MCV: 84.4 fL (ref 80.0–100.0)
Platelets: 271 10*3/uL (ref 150–400)
RBC: 3.72 MIL/uL — ABNORMAL LOW (ref 4.22–5.81)
RDW: 15.7 % — ABNORMAL HIGH (ref 11.5–15.5)
WBC: 5.6 10*3/uL (ref 4.0–10.5)
nRBC: 0 % (ref 0.0–0.2)

## 2019-05-19 MED ORDER — CEPHALEXIN 500 MG PO CAPS
500.0000 mg | ORAL_CAPSULE | Freq: Three times a day (TID) | ORAL | Status: DC
  Administered 2019-05-19: 500 mg via ORAL
  Filled 2019-05-19: qty 1

## 2019-05-19 MED ORDER — FISH OIL 1000 MG PO CAPS: 1000.0000 mg | ORAL_CAPSULE | Freq: Two times a day (BID) | ORAL | 0 refills | Status: DC

## 2019-05-19 MED ORDER — PREGABALIN 100 MG PO CAPS: 200.0000 mg | ORAL_CAPSULE | Freq: Two times a day (BID) | ORAL | Status: DC

## 2019-05-19 MED ORDER — CEPHALEXIN 500 MG PO CAPS: 500.0000 mg | ORAL_CAPSULE | Freq: Three times a day (TID) | ORAL | 0 refills | Status: AC

## 2019-05-19 MED ORDER — CEPHALEXIN 500 MG PO CAPS: 500.0000 mg | ORAL_CAPSULE | Freq: Three times a day (TID) | ORAL | 0 refills | Status: DC

## 2019-05-19 MED FILL — CEPHALEXIN 500 MG CAPSULE: 500 | 4 days supply | Qty: 11 | Fill #0

## 2019-05-19 NOTE — Discharge Instructions (Signed)
You were admitted to the hospital with a urinary tract infection.  While you were here, you were treated with IV antibiotics and showed good improvement.  You were ultimately transition from IV antibiotics to oral antibiotics before sending you home.  Please complete your antibiotic course which will finish on 6/25.  While you are here, you had a blood test that showed you had elevated triglycerides.  These are lipids in your bloodstream and they can be related to vascular disease in your future.  We will start you on treatment with fish oil pills (2 pills twice daily) and recheck these levels in 6 months.  We have also put in a referral to dermatology for further work-up and treatment of the skin lesions that you noted were bothering you for the past several years.  You should be getting a call from dermatology within the week.

## 2019-05-19 NOTE — Progress Notes (Signed)
DISCHARGE NOTE HOME Sean Horton to be discharged Home per MD order. Discussed prescriptions and follow up appointments with the patient. Prescriptions given to patient; medication list explained in detail. Patient verbalized understanding.  IV catheter discontinued intact. Site without signs and symptoms of complications. Dressing and pressure applied. Pt denies pain at the site currently. No complaints noted.   An After Visit Summary (AVS) was printed and given to the patient. Patient was discharged home via [facility].  Orville Govern, RN3

## 2019-05-19 NOTE — Consult Note (Signed)
[facility] Nurse wound consult note Patient receiving care in [facility] 5M01 Reason for Consult: wounds on back and left axilla Wound type: unknown etiology Measurement: Left scapula has an open area that measures 2 cm x 2.2 cm, no measurable depth. The right scapula has an open area that measures 1.2 cm x 1.8 cm, no measurable depth.  The area at the left axilla/posterior arm has a wound that measures 3.3 cm x 2.8 cm x 0.2 cm.  The left lateral mid arm has a wound that measures 4 cm x 3 cm x no measurable depth.  The area below this and above the elbow has a wound that measures 2.4 cm x 6 cm, no measurable depth. Wound bed: wound beds are pink Drainage (amount, consistency, odor) some serous drainage.  All areas are covered by film dressings. Periwound: All periwounds are blackened (hyperpigmented tissue) with scattered hypopigmentation.  The patient tells me it started on his chest years ago, and eventually they all heal, leaving blackened tissue with scattered hypopigmentation.  The areas are not painful.  He has never seen a dermatologist. Please arrange for a dermatology consult when discharged. Dressing procedure/placement/frequency:  Cleanse areas on back and left posterior arm/axilla with soap and water. Pat dry. Cover the wet/open areas with Aquacel Ag Kellie Simmering [phone]), then foam dressings. Change daily. Monitor the wound area(s) for worsening of condition such as: Signs/symptoms of infection,  Increase in size,  Development of or worsening of odor, Development of pain, or increased pain at the affected locations.  Notify the medical team if any of these develop.  Thank you for the consult.  Discussed plan of care with the patient and bedside nurse.  [facility] nurse will not follow at this time.  Please re-consult the [facility] team if needed.  Val Riles, RN, MSN, CWOCN, CNS-BC, pager [phone]

## 2019-05-19 NOTE — TOC Transition Note (Signed)
Transition of Care [facility]) - CM/SW Discharge Note   Patient Details  Name: Sean Horton MRN: [id number] Date of Birth: [birth year]  [facility] CM/SW Contact:  Bartholomew Crews, RN Phone Number: [phone] 05/19/2019, 12:48 PM   Clinical Narrative:    Spoke with patient at the bedside. States that he has his keys to get into home. Wheelchair is at home. Patient is requesting [facility] transport. Medications to be delivered from Transition of Care phamacy. No other transition of care needs identified at this time.    Final next level of care: Home/Self Care Barriers to Discharge: No Barriers Identified   Patient Goals and CMS Choice        Discharge Placement               home        Discharge Plan and Services                DME Arranged: N/A DME Agency: NA       HH Arranged: NA HH Agency: NA        Social Determinants of Health (SDOH) Interventions     Readmission Risk Interventions No flowsheet data found.
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
## 2019-05-22 LAB — NOVEL CORONAVIRUS, NAA (HOSP ORDER, SEND-OUT TO REF LAB; TAT 18-24 HRS): SARS-CoV-2, NAA: NOT DETECTED

## 2019-05-23 LAB — CULTURE, BLOOD (ROUTINE X 2)
Culture: NO GROWTH
Culture: NO GROWTH

## 2019-06-02 ENCOUNTER — Telehealth: Payer: Self-pay | Admitting: *Deleted

## 2019-06-02 NOTE — Telephone Encounter (Signed)
Patient has upcoming appt with neurology and are aware.   After reading Dr. Garth Bigness note, they may be thinking surgery may not be indicated at this time.   Patient should keep his neurology appt scheduled for later this month and speak with neuro regarding need for surgery.

## 2019-06-02 NOTE — Telephone Encounter (Signed)
Pt is requesting a referral to a doctor about his leg contractures and possible surgery. Christen Bame, CMA

## 2019-06-03 NOTE — Telephone Encounter (Signed)
LVM stating note. Also reminded pt of appt with Neurology on 06/17/2019 at 3:30pm Salvatore Marvel, CMA

## 2019-06-04 ENCOUNTER — Other Ambulatory Visit: Payer: Self-pay

## 2019-06-04 ENCOUNTER — Emergency Department (HOSPITAL_COMMUNITY): Payer: Medicaid Other

## 2019-06-04 ENCOUNTER — Inpatient Hospital Stay (HOSPITAL_COMMUNITY)
Admission: EM | Admit: 2019-06-04 | Discharge: 2019-06-07 | DRG: 698 | Disposition: A | Discharge status: Home / Self Care | Payer: Medicaid Other | Attending: Internal Medicine | Admitting: Internal Medicine

## 2019-06-04 DIAGNOSIS — M25062 Hemarthrosis, left knee: Secondary | ICD-10-CM | POA: Diagnosis present

## 2019-06-04 DIAGNOSIS — F17210 Nicotine dependence, cigarettes, uncomplicated: Secondary | ICD-10-CM | POA: Diagnosis present

## 2019-06-04 DIAGNOSIS — N179 Acute kidney failure, unspecified: Secondary | ICD-10-CM | POA: Diagnosis present

## 2019-06-04 DIAGNOSIS — M6282 Rhabdomyolysis: Secondary | ICD-10-CM | POA: Diagnosis present

## 2019-06-04 DIAGNOSIS — N182 Chronic kidney disease, stage 2 (mild): Secondary | ICD-10-CM | POA: Diagnosis present

## 2019-06-04 DIAGNOSIS — L89159 Pressure ulcer of sacral region, unspecified stage: Secondary | ICD-10-CM | POA: Diagnosis present

## 2019-06-04 DIAGNOSIS — E860 Dehydration: Secondary | ICD-10-CM | POA: Diagnosis present

## 2019-06-04 DIAGNOSIS — M2500 Hemarthrosis, unspecified joint: Secondary | ICD-10-CM | POA: Diagnosis present

## 2019-06-04 DIAGNOSIS — Z978 Presence of other specified devices: Secondary | ICD-10-CM

## 2019-06-04 DIAGNOSIS — G9341 Metabolic encephalopathy: Secondary | ICD-10-CM | POA: Diagnosis present

## 2019-06-04 DIAGNOSIS — S24102S Unspecified injury at T2-T6 level of thoracic spinal cord, sequela: Secondary | ICD-10-CM

## 2019-06-04 DIAGNOSIS — D631 Anemia in chronic kidney disease: Secondary | ICD-10-CM | POA: Diagnosis present

## 2019-06-04 DIAGNOSIS — G92 Toxic encephalopathy: Secondary | ICD-10-CM | POA: Diagnosis present

## 2019-06-04 DIAGNOSIS — E875 Hyperkalemia: Secondary | ICD-10-CM | POA: Diagnosis present

## 2019-06-04 DIAGNOSIS — Z1159 Encounter for screening for other viral diseases: Secondary | ICD-10-CM

## 2019-06-04 DIAGNOSIS — R402142 Coma scale, eyes open, spontaneous, at arrival to emergency department: Secondary | ICD-10-CM | POA: Diagnosis present

## 2019-06-04 DIAGNOSIS — G904 Autonomic dysreflexia: Secondary | ICD-10-CM | POA: Diagnosis present

## 2019-06-04 DIAGNOSIS — K5900 Constipation, unspecified: Secondary | ICD-10-CM | POA: Diagnosis present

## 2019-06-04 DIAGNOSIS — K592 Neurogenic bowel, not elsewhere classified: Secondary | ICD-10-CM | POA: Diagnosis present

## 2019-06-04 DIAGNOSIS — Z8249 Family history of ischemic heart disease and other diseases of the circulatory system: Secondary | ICD-10-CM

## 2019-06-04 DIAGNOSIS — N390 Urinary tract infection, site not specified: Secondary | ICD-10-CM | POA: Diagnosis present

## 2019-06-04 DIAGNOSIS — D638 Anemia in other chronic diseases classified elsewhere: Secondary | ICD-10-CM | POA: Diagnosis present

## 2019-06-04 DIAGNOSIS — L98491 Non-pressure chronic ulcer of skin of other sites limited to breakdown of skin: Secondary | ICD-10-CM

## 2019-06-04 DIAGNOSIS — L89152 Pressure ulcer of sacral region, stage 2: Secondary | ICD-10-CM | POA: Diagnosis present

## 2019-06-04 DIAGNOSIS — Z833 Family history of diabetes mellitus: Secondary | ICD-10-CM

## 2019-06-04 DIAGNOSIS — G8929 Other chronic pain: Secondary | ICD-10-CM | POA: Diagnosis present

## 2019-06-04 DIAGNOSIS — G928 Other toxic encephalopathy: Secondary | ICD-10-CM

## 2019-06-04 DIAGNOSIS — R402212 Coma scale, best verbal response, none, at arrival to emergency department: Secondary | ICD-10-CM | POA: Diagnosis present

## 2019-06-04 DIAGNOSIS — N319 Neuromuscular dysfunction of bladder, unspecified: Secondary | ICD-10-CM | POA: Diagnosis present

## 2019-06-04 DIAGNOSIS — L98499 Non-pressure chronic ulcer of skin of other sites with unspecified severity: Secondary | ICD-10-CM | POA: Diagnosis present

## 2019-06-04 DIAGNOSIS — W19XXXA Unspecified fall, initial encounter: Secondary | ICD-10-CM | POA: Diagnosis present

## 2019-06-04 DIAGNOSIS — N35819 Other urethral stricture, male, unspecified site: Secondary | ICD-10-CM | POA: Diagnosis present

## 2019-06-04 DIAGNOSIS — W3400XS Accidental discharge from unspecified firearms or gun, sequela: Secondary | ICD-10-CM

## 2019-06-04 DIAGNOSIS — T796XXA Traumatic ischemia of muscle, initial encounter: Secondary | ICD-10-CM | POA: Diagnosis present

## 2019-06-04 DIAGNOSIS — A419 Sepsis, unspecified organism: Secondary | ICD-10-CM | POA: Diagnosis present

## 2019-06-04 DIAGNOSIS — S72435A Nondisplaced fracture of medial condyle of left femur, initial encounter for closed fracture: Secondary | ICD-10-CM | POA: Diagnosis present

## 2019-06-04 DIAGNOSIS — T83511A Infection and inflammatory reaction due to indwelling urethral catheter, initial encounter: Principal | ICD-10-CM | POA: Diagnosis present

## 2019-06-04 DIAGNOSIS — T8189XA Other complications of procedures, not elsewhere classified, initial encounter: Secondary | ICD-10-CM

## 2019-06-04 DIAGNOSIS — Z993 Dependence on wheelchair: Secondary | ICD-10-CM

## 2019-06-04 DIAGNOSIS — R402342 Coma scale, best motor response, flexion withdrawal, at arrival to emergency department: Secondary | ICD-10-CM | POA: Diagnosis present

## 2019-06-04 DIAGNOSIS — N318 Other neuromuscular dysfunction of bladder: Secondary | ICD-10-CM | POA: Diagnosis present

## 2019-06-04 LAB — CBC WITH DIFFERENTIAL/PLATELET
Abs Immature Granulocytes: 0.12 10*3/uL — ABNORMAL HIGH (ref 0.00–0.07)
Basophils Absolute: 0 10*3/uL (ref 0.0–0.1)
Basophils Relative: 0 %
Eosinophils Absolute: 0 10*3/uL (ref 0.0–0.5)
Eosinophils Relative: 0 %
HCT: 40.6 % (ref 39.0–52.0)
Hemoglobin: 12.6 g/dL — ABNORMAL LOW (ref 13.0–17.0)
Immature Granulocytes: 1 %
Lymphocytes Relative: 8 %
Lymphs Abs: 1.2 10*3/uL (ref 0.7–4.0)
MCH: 25.8 pg — ABNORMAL LOW (ref 26.0–34.0)
MCHC: 31 g/dL (ref 30.0–36.0)
MCV: 83.2 fL (ref 80.0–100.0)
Monocytes Absolute: 0.8 10*3/uL (ref 0.1–1.0)
Monocytes Relative: 6 %
Neutro Abs: 12.4 10*3/uL — ABNORMAL HIGH (ref 1.7–7.7)
Neutrophils Relative %: 85 %
Platelets: 358 10*3/uL (ref 150–400)
RBC: 4.88 MIL/uL (ref 4.22–5.81)
RDW: 14.7 % (ref 11.5–15.5)
WBC: 14.5 10*3/uL — ABNORMAL HIGH (ref 4.0–10.5)
nRBC: 0 % (ref 0.0–0.2)

## 2019-06-04 LAB — COMPREHENSIVE METABOLIC PANEL
ALT: 30 U/L (ref 0–44)
AST: 50 U/L — ABNORMAL HIGH (ref 15–41)
Albumin: 3.9 g/dL (ref 3.5–5.0)
Alkaline Phosphatase: 129 U/L — ABNORMAL HIGH (ref 38–126)
Anion gap: 15 (ref 5–15)
BUN: 64 mg/dL — ABNORMAL HIGH (ref 6–20)
CO2: 20 mmol/L — ABNORMAL LOW (ref 22–32)
Calcium: 9.2 mg/dL (ref 8.9–10.3)
Chloride: 107 mmol/L (ref 98–111)
Creatinine, Ser: 3.24 mg/dL — ABNORMAL HIGH (ref 0.61–1.24)
GFR calc Af Amer: 28 mL/min — ABNORMAL LOW (ref >60)
GFR calc non Af Amer: 24 mL/min — ABNORMAL LOW (ref >60)
Glucose, Bld: 135 mg/dL — ABNORMAL HIGH (ref 70–99)
Potassium: 5.8 mmol/L — ABNORMAL HIGH (ref 3.5–5.1)
Sodium: 142 mmol/L (ref 135–145)
Total Bilirubin: 0.8 mg/dL (ref 0.3–1.2)
Total Protein: 8.6 g/dL — ABNORMAL HIGH (ref 6.5–8.1)

## 2019-06-04 LAB — ETHANOL: Alcohol, Ethyl (B): <10 mg/dL (ref <10)

## 2019-06-04 LAB — CBG MONITORING, ED: Glucose-Capillary: 121 mg/dL — ABNORMAL HIGH (ref 70–99)

## 2019-06-04 LAB — PROTIME-INR
INR: 1.1 (ref 0.8–1.2)
Prothrombin Time: 14.4 seconds (ref 11.4–15.2)

## 2019-06-04 LAB — AMMONIA: Ammonia: 25 umol/L (ref 9–35)

## 2019-06-04 LAB — CK: Total CK: 1733 U/L — ABNORMAL HIGH (ref 49–397)

## 2019-06-04 LAB — LACTIC ACID, PLASMA: Lactic Acid, Venous: 2.7 mmol/L (ref 0.5–1.9)

## 2019-06-04 MED ORDER — SODIUM CHLORIDE 0.9 % IV BOLUS
30.0000 mL/kg | Freq: Once | INTRAVENOUS | Status: AC
  Administered 2019-06-05: 3267 mL via INTRAVENOUS

## 2019-06-04 MED ORDER — VANCOMYCIN HCL IN DEXTROSE 1-5 GM/200ML-% IV SOLN: 1000.0000 mg | Freq: Once | INTRAVENOUS | Status: DC

## 2019-06-04 MED ORDER — LORAZEPAM 2 MG/ML IJ SOLN
1.0000 mg | Freq: Once | INTRAMUSCULAR | Status: AC
  Administered 2019-06-04: 23:00:00 1 mg via INTRAVENOUS
  Filled 2019-06-04: qty 1

## 2019-06-04 MED ORDER — HALOPERIDOL LACTATE 5 MG/ML IJ SOLN
INTRAMUSCULAR | Status: AC
  Administered 2019-06-04: 5 mg
  Filled 2019-06-04: qty 1

## 2019-06-04 MED ORDER — VANCOMYCIN HCL 10 G IV SOLR
2000.0000 mg | Freq: Once | INTRAVENOUS | Status: AC
  Administered 2019-06-05: 01:00:00 2000 mg via INTRAVENOUS
  Filled 2019-06-04: qty 2000

## 2019-06-04 MED ORDER — METRONIDAZOLE IN NACL 5-0.79 MG/ML-% IV SOLN
500.0000 mg | Freq: Once | INTRAVENOUS | Status: AC
  Administered 2019-06-05: 500 mg via INTRAVENOUS
  Filled 2019-06-04: qty 100

## 2019-06-04 MED ORDER — SODIUM CHLORIDE 0.9 % IV SOLN
2.0000 g | Freq: Once | INTRAVENOUS | Status: AC
  Administered 2019-06-04: 2 g via INTRAVENOUS
  Filled 2019-06-04: qty 2

## 2019-06-04 NOTE — Progress Notes (Signed)
A consult was received from an ED physician for cefepime and vancomycin per pharmacy dosing.  The patient's profile has been reviewed for ht/wt/allergies/indication/available labs.   A one time order has been placed for Cefepime 2 Gm and Vancomycin 2 Gm.  Further antibiotics/pharmacy consults should be ordered by admitting physician if indicated.                       Thank you, Dorrene German 06/04/2019  10:16 PM
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
## 2019-06-04 NOTE — ED Triage Notes (Signed)
Per EMS - Pt coming form home, lives alone, paralysis from T3 down. Found on floor in bedroom after unwitnessed fall. Unknownlength of time on floor. Pt is altered from baseline and nonverbally responsive, but physically combative. Pt has foley full of dark cloudy urine.

## 2019-06-05 ENCOUNTER — Emergency Department (HOSPITAL_COMMUNITY): Payer: Medicaid Other

## 2019-06-05 ENCOUNTER — Inpatient Hospital Stay (HOSPITAL_COMMUNITY): Payer: Medicaid Other

## 2019-06-05 ENCOUNTER — Encounter (HOSPITAL_COMMUNITY): Payer: Self-pay | Admitting: Internal Medicine

## 2019-06-05 DIAGNOSIS — D638 Anemia in other chronic diseases classified elsewhere: Secondary | ICD-10-CM

## 2019-06-05 DIAGNOSIS — Z993 Dependence on wheelchair: Secondary | ICD-10-CM | POA: Diagnosis not present

## 2019-06-05 DIAGNOSIS — M2500 Hemarthrosis, unspecified joint: Secondary | ICD-10-CM | POA: Diagnosis present

## 2019-06-05 DIAGNOSIS — N390 Urinary tract infection, site not specified: Secondary | ICD-10-CM

## 2019-06-05 DIAGNOSIS — N319 Neuromuscular dysfunction of bladder, unspecified: Secondary | ICD-10-CM

## 2019-06-05 DIAGNOSIS — R402342 Coma scale, best motor response, flexion withdrawal, at arrival to emergency department: Secondary | ICD-10-CM | POA: Diagnosis present

## 2019-06-05 DIAGNOSIS — F17210 Nicotine dependence, cigarettes, uncomplicated: Secondary | ICD-10-CM | POA: Diagnosis present

## 2019-06-05 DIAGNOSIS — G9341 Metabolic encephalopathy: Secondary | ICD-10-CM | POA: Diagnosis not present

## 2019-06-05 DIAGNOSIS — R4182 Altered mental status, unspecified: Secondary | ICD-10-CM | POA: Diagnosis present

## 2019-06-05 DIAGNOSIS — A419 Sepsis, unspecified organism: Secondary | ICD-10-CM

## 2019-06-05 DIAGNOSIS — Z8249 Family history of ischemic heart disease and other diseases of the circulatory system: Secondary | ICD-10-CM | POA: Diagnosis not present

## 2019-06-05 DIAGNOSIS — N179 Acute kidney failure, unspecified: Secondary | ICD-10-CM

## 2019-06-05 DIAGNOSIS — N182 Chronic kidney disease, stage 2 (mild): Secondary | ICD-10-CM | POA: Diagnosis present

## 2019-06-05 DIAGNOSIS — R402142 Coma scale, eyes open, spontaneous, at arrival to emergency department: Secondary | ICD-10-CM | POA: Diagnosis present

## 2019-06-05 DIAGNOSIS — L98492 Non-pressure chronic ulcer of skin of other sites with fat layer exposed: Secondary | ICD-10-CM

## 2019-06-05 DIAGNOSIS — G92 Toxic encephalopathy: Secondary | ICD-10-CM

## 2019-06-05 DIAGNOSIS — Z833 Family history of diabetes mellitus: Secondary | ICD-10-CM | POA: Diagnosis not present

## 2019-06-05 DIAGNOSIS — Z960 Presence of urogenital implants: Secondary | ICD-10-CM

## 2019-06-05 DIAGNOSIS — K592 Neurogenic bowel, not elsewhere classified: Secondary | ICD-10-CM | POA: Diagnosis present

## 2019-06-05 DIAGNOSIS — M25062 Hemarthrosis, left knee: Secondary | ICD-10-CM

## 2019-06-05 DIAGNOSIS — N318 Other neuromuscular dysfunction of bladder: Secondary | ICD-10-CM | POA: Diagnosis present

## 2019-06-05 DIAGNOSIS — L98491 Non-pressure chronic ulcer of skin of other sites limited to breakdown of skin: Secondary | ICD-10-CM

## 2019-06-05 DIAGNOSIS — K5900 Constipation, unspecified: Secondary | ICD-10-CM

## 2019-06-05 DIAGNOSIS — M6282 Rhabdomyolysis: Secondary | ICD-10-CM

## 2019-06-05 DIAGNOSIS — W19XXXA Unspecified fall, initial encounter: Secondary | ICD-10-CM | POA: Diagnosis present

## 2019-06-05 DIAGNOSIS — L89152 Pressure ulcer of sacral region, stage 2: Secondary | ICD-10-CM | POA: Diagnosis present

## 2019-06-05 DIAGNOSIS — R402212 Coma scale, best verbal response, none, at arrival to emergency department: Secondary | ICD-10-CM | POA: Diagnosis present

## 2019-06-05 DIAGNOSIS — E860 Dehydration: Secondary | ICD-10-CM | POA: Diagnosis present

## 2019-06-05 DIAGNOSIS — G8929 Other chronic pain: Secondary | ICD-10-CM | POA: Diagnosis present

## 2019-06-05 DIAGNOSIS — E875 Hyperkalemia: Secondary | ICD-10-CM

## 2019-06-05 DIAGNOSIS — T796XXA Traumatic ischemia of muscle, initial encounter: Secondary | ICD-10-CM | POA: Diagnosis present

## 2019-06-05 DIAGNOSIS — S72435A Nondisplaced fracture of medial condyle of left femur, initial encounter for closed fracture: Secondary | ICD-10-CM | POA: Diagnosis present

## 2019-06-05 DIAGNOSIS — T83511A Infection and inflammatory reaction due to indwelling urethral catheter, initial encounter: Secondary | ICD-10-CM | POA: Diagnosis present

## 2019-06-05 DIAGNOSIS — Z1159 Encounter for screening for other viral diseases: Secondary | ICD-10-CM | POA: Diagnosis not present

## 2019-06-05 DIAGNOSIS — W3400XS Accidental discharge from unspecified firearms or gun, sequela: Secondary | ICD-10-CM | POA: Diagnosis not present

## 2019-06-05 DIAGNOSIS — S24102S Unspecified injury at T2-T6 level of thoracic spinal cord, sequela: Secondary | ICD-10-CM | POA: Diagnosis not present

## 2019-06-05 HISTORY — DX: Rhabdomyolysis: M62.82

## 2019-06-05 HISTORY — DX: Hemarthrosis, left knee: M25.062

## 2019-06-05 LAB — COMPREHENSIVE METABOLIC PANEL
ALT: 25 U/L (ref 0–44)
ALT: 24 U/L (ref 0–44)
AST: 36 U/L (ref 15–41)
AST: 33 U/L (ref 15–41)
Albumin: 3.1 g/dL — ABNORMAL LOW (ref 3.5–5.0)
Albumin: 3.1 g/dL — ABNORMAL LOW (ref 3.5–5.0)
Alkaline Phosphatase: 97 U/L (ref 38–126)
Alkaline Phosphatase: 91 U/L (ref 38–126)
Anion gap: 9 (ref 5–15)
Anion gap: 11 (ref 5–15)
BUN: 58 mg/dL — ABNORMAL HIGH (ref 6–20)
BUN: 56 mg/dL — ABNORMAL HIGH (ref 6–20)
CO2: 21 mmol/L — ABNORMAL LOW (ref 22–32)
CO2: 19 mmol/L — ABNORMAL LOW (ref 22–32)
Calcium: 8.4 mg/dL — ABNORMAL LOW (ref 8.9–10.3)
Calcium: 8.4 mg/dL — ABNORMAL LOW (ref 8.9–10.3)
Chloride: 112 mmol/L — ABNORMAL HIGH (ref 98–111)
Chloride: 112 mmol/L — ABNORMAL HIGH (ref 98–111)
Creatinine, Ser: 2.57 mg/dL — ABNORMAL HIGH (ref 0.61–1.24)
Creatinine, Ser: 2.33 mg/dL — ABNORMAL HIGH (ref 0.61–1.24)
GFR calc Af Amer: 36 mL/min — ABNORMAL LOW (ref >60)
GFR calc Af Amer: 41 mL/min — ABNORMAL LOW (ref >60)
GFR calc non Af Amer: 31 mL/min — ABNORMAL LOW (ref >60)
GFR calc non Af Amer: 35 mL/min — ABNORMAL LOW (ref >60)
Glucose, Bld: 133 mg/dL — ABNORMAL HIGH (ref 70–99)
Glucose, Bld: 159 mg/dL — ABNORMAL HIGH (ref 70–99)
Potassium: 4.9 mmol/L (ref 3.5–5.1)
Potassium: 4.4 mmol/L (ref 3.5–5.1)
Sodium: 142 mmol/L (ref 135–145)
Sodium: 142 mmol/L (ref 135–145)
Total Bilirubin: 1 mg/dL (ref 0.3–1.2)
Total Bilirubin: 0.9 mg/dL (ref 0.3–1.2)
Total Protein: 7.3 g/dL (ref 6.5–8.1)
Total Protein: 7 g/dL (ref 6.5–8.1)

## 2019-06-05 LAB — RETICULOCYTES
Immature Retic Fract: 15.1 % (ref 2.3–15.9)
RBC.: 3.89 MIL/uL — ABNORMAL LOW (ref 4.22–5.81)
Retic Count, Absolute: 45.5 10*3/uL (ref 19.0–186.0)
Retic Ct Pct: 1.2 % (ref 0.4–3.1)

## 2019-06-05 LAB — LACTIC ACID, PLASMA
Lactic Acid, Venous: 1.4 mmol/L (ref 0.5–1.9)
Lactic Acid, Venous: 1 mmol/L (ref 0.5–1.9)

## 2019-06-05 LAB — URINALYSIS, COMPLETE (UACMP) WITH MICROSCOPIC
Bilirubin Urine: NEGATIVE
Glucose, UA: NEGATIVE mg/dL
Ketones, ur: NEGATIVE mg/dL
Nitrite: NEGATIVE
Protein, ur: 100 mg/dL — AB
RBC / HPF: >50 RBC/hpf — ABNORMAL HIGH (ref 0–5)
Specific Gravity, Urine: 1.016 (ref 1.005–1.030)
pH: 5 (ref 5.0–8.0)

## 2019-06-05 LAB — CBC
HCT: 34.6 % — ABNORMAL LOW (ref 39.0–52.0)
Hemoglobin: 10.4 g/dL — ABNORMAL LOW (ref 13.0–17.0)
MCH: 25.1 pg — ABNORMAL LOW (ref 26.0–34.0)
MCHC: 30.1 g/dL (ref 30.0–36.0)
MCV: 83.6 fL (ref 80.0–100.0)
Platelets: 282 10*3/uL (ref 150–400)
RBC: 4.14 MIL/uL — ABNORMAL LOW (ref 4.22–5.81)
RDW: 14.7 % (ref 11.5–15.5)
WBC: 10.4 10*3/uL (ref 4.0–10.5)
nRBC: 0 % (ref 0.0–0.2)

## 2019-06-05 LAB — BLOOD GAS, VENOUS
Acid-base deficit: 2.9 mmol/L — ABNORMAL HIGH (ref 0.0–2.0)
Bicarbonate: 21.7 mmol/L (ref 20.0–28.0)
Drawn by: 47141
FIO2: 21
O2 Saturation: 55.8 %
Patient temperature: 98.6
pCO2, Ven: 39.7 mmHg — ABNORMAL LOW (ref 44.0–60.0)
pH, Ven: 7.358 (ref 7.250–7.430)
pO2, Ven: 32.1 mmHg (ref 32.0–45.0)

## 2019-06-05 LAB — IRON AND TIBC
Iron: 21 ug/dL — ABNORMAL LOW (ref 45–182)
Saturation Ratios: 8 % — ABNORMAL LOW (ref 17.9–39.5)
TIBC: 255 ug/dL (ref 250–450)
UIBC: 234 ug/dL

## 2019-06-05 LAB — SARS CORONAVIRUS 2 BY RT PCR (HOSPITAL ORDER, PERFORMED IN ~~LOC~~ HOSPITAL LAB): SARS Coronavirus 2: NEGATIVE

## 2019-06-05 LAB — CK
Total CK: 1167 U/L — ABNORMAL HIGH (ref 49–397)
Total CK: 966 U/L — ABNORMAL HIGH (ref 49–397)

## 2019-06-05 LAB — RAPID URINE DRUG SCREEN, HOSP PERFORMED
Amphetamines: NOT DETECTED
Barbiturates: NOT DETECTED
Benzodiazepines: NOT DETECTED
Cocaine: NOT DETECTED
Opiates: POSITIVE — AB
Tetrahydrocannabinol: NOT DETECTED

## 2019-06-05 LAB — SYNOVIAL CELL COUNT + DIFF, W/ CRYSTALS
Crystals, Fluid: NONE SEEN
Eosinophils-Synovial: 1 % (ref 0–1)
Lymphocytes-Synovial Fld: 9 % (ref 0–20)
Monocyte-Macrophage-Synovial Fluid: 5 % — ABNORMAL LOW (ref 50–90)
Neutrophil, Synovial: 85 % — ABNORMAL HIGH (ref 0–25)
WBC, Synovial: 8610 /mm3 — ABNORMAL HIGH (ref 0–200)

## 2019-06-05 LAB — FERRITIN: Ferritin: 325 ng/mL (ref 24–336)

## 2019-06-05 LAB — PROCALCITONIN: Procalcitonin: 1.03 ng/mL

## 2019-06-05 LAB — CREATININE, URINE, RANDOM: Creatinine, Urine: 165.49 mg/dL

## 2019-06-05 LAB — MAGNESIUM: Magnesium: 2.1 mg/dL (ref 1.7–2.4)

## 2019-06-05 LAB — SODIUM, URINE, RANDOM: Sodium, Ur: 23 mmol/L

## 2019-06-05 LAB — MRSA PCR SCREENING: MRSA by PCR: NEGATIVE

## 2019-06-05 LAB — TSH: TSH: 2.374 u[IU]/mL (ref 0.350–4.500)

## 2019-06-05 LAB — PHOSPHORUS: Phosphorus: 3.6 mg/dL (ref 2.5–4.6)

## 2019-06-05 LAB — FOLATE: Folate: 20.8 ng/mL (ref >5.9)

## 2019-06-05 MED ORDER — BISACODYL 10 MG RE SUPP
10.0000 mg | Freq: Every day | RECTAL | Status: DC | PRN
  Administered 2019-06-06: 10 mg via RECTAL
  Filled 2019-06-05 (×2): qty 1

## 2019-06-05 MED ORDER — PRO-STAT SUGAR FREE PO LIQD
30.0000 mL | Freq: Two times a day (BID) | ORAL | Status: DC
  Administered 2019-06-05 – 2019-06-07 (×3): 30 mL via ORAL
  Filled 2019-06-05 (×4): qty 30

## 2019-06-05 MED ORDER — ACETAMINOPHEN 325 MG PO TABS
650.0000 mg | ORAL_TABLET | Freq: Four times a day (QID) | ORAL | Status: DC | PRN
  Administered 2019-06-06 (×2): 650 mg via ORAL
  Filled 2019-06-05 (×2): qty 2

## 2019-06-05 MED ORDER — ACETAMINOPHEN 650 MG RE SUPP: 650.0000 mg | Freq: Four times a day (QID) | RECTAL | Status: DC | PRN

## 2019-06-05 MED ORDER — LACTATED RINGERS IV BOLUS
500.0000 mL | Freq: Once | INTRAVENOUS | Status: AC
  Administered 2019-06-05: 500 mL via INTRAVENOUS

## 2019-06-05 MED ORDER — VITAMIN K1 10 MG/ML IJ SOLN
10.0000 mg | Freq: Once | INTRAVENOUS | Status: DC
  Filled 2019-06-05: qty 1

## 2019-06-05 MED ORDER — VANCOMYCIN HCL 10 G IV SOLR
1500.0000 mg | Freq: Every day | INTRAVENOUS | Status: DC
  Administered 2019-06-06: 1500 mg via INTRAVENOUS
  Filled 2019-06-05: qty 1500

## 2019-06-05 MED ORDER — ONDANSETRON HCL 4 MG/2ML IJ SOLN: 4.0000 mg | Freq: Four times a day (QID) | INTRAMUSCULAR | Status: DC | PRN

## 2019-06-05 MED ORDER — ADULT MULTIVITAMIN W/MINERALS CH
1.0000 | ORAL_TABLET | Freq: Every day | ORAL | Status: DC
  Administered 2019-06-05 – 2019-06-07 (×3): 1 via ORAL
  Filled 2019-06-05 (×3): qty 1

## 2019-06-05 MED ORDER — ONDANSETRON HCL 4 MG PO TABS: 4.0000 mg | ORAL_TABLET | Freq: Four times a day (QID) | ORAL | Status: DC | PRN

## 2019-06-05 MED ORDER — SODIUM CHLORIDE 0.9 % IV BOLUS
500.0000 mL | Freq: Once | INTRAVENOUS | Status: AC
  Administered 2019-06-05: 500 mL via INTRAVENOUS

## 2019-06-05 MED ORDER — OXYCODONE HCL 5 MG PO TABS
5.0000 mg | ORAL_TABLET | Freq: Four times a day (QID) | ORAL | Status: DC | PRN
  Administered 2019-06-05 – 2019-06-06 (×2): 5 mg via ORAL
  Filled 2019-06-05 (×2): qty 1

## 2019-06-05 MED ORDER — MILK AND MOLASSES ENEMA
1.0000 | RECTAL | Status: DC | PRN
  Filled 2019-06-05: qty 240

## 2019-06-05 MED ORDER — LACTULOSE ENEMA
300.0000 mL | Freq: Once | ORAL | Status: DC
  Filled 2019-06-05: qty 300

## 2019-06-05 MED ORDER — CHLORHEXIDINE GLUCONATE CLOTH 2 % EX PADS
6.0000 | MEDICATED_PAD | Freq: Every day | CUTANEOUS | Status: DC
  Administered 2019-06-05 – 2019-06-07 (×3): 6 via TOPICAL

## 2019-06-05 MED ORDER — OXYCODONE HCL ER 20 MG PO T12A
40.0000 mg | EXTENDED_RELEASE_TABLET | Freq: Two times a day (BID) | ORAL | Status: DC
  Administered 2019-06-05 – 2019-06-07 (×5): 40 mg via ORAL
  Filled 2019-06-05 (×5): qty 2

## 2019-06-05 MED ORDER — VANCOMYCIN HCL 10 G IV SOLR: 1500.0000 mg | Freq: Every day | INTRAVENOUS | Status: DC

## 2019-06-05 MED ORDER — OXYCODONE ER 36 MG PO C12A: 36.0000 mg | EXTENDED_RELEASE_CAPSULE | Freq: Two times a day (BID) | ORAL | Status: DC

## 2019-06-05 MED ORDER — MUPIROCIN 2 % EX OINT
TOPICAL_OINTMENT | Freq: Every day | CUTANEOUS | Status: DC
  Administered 2019-06-07: 09:00:00 via TOPICAL
  Filled 2019-06-05: qty 22

## 2019-06-05 MED ORDER — SODIUM CHLORIDE 0.9 % IV SOLN
2.0000 g | Freq: Two times a day (BID) | INTRAVENOUS | Status: DC
  Administered 2019-06-05 – 2019-06-06 (×3): 2 g via INTRAVENOUS
  Filled 2019-06-05 (×3): qty 2

## 2019-06-05 MED ORDER — BOOST / RESOURCE BREEZE PO LIQD CUSTOM
1.0000 | ORAL | Status: DC
  Administered 2019-06-05 – 2019-06-07 (×3): 1 via ORAL

## 2019-06-05 MED ORDER — OXYCODONE HCL 5 MG PO TABS
5.0000 mg | ORAL_TABLET | Freq: Once | ORAL | Status: AC
  Administered 2019-06-05: 21:00:00 5 mg via ORAL
  Filled 2019-06-05: qty 1

## 2019-06-05 MED ORDER — SODIUM CHLORIDE 0.9 % IV SOLN
INTRAVENOUS | Status: AC
  Administered 2019-06-05 (×2): via INTRAVENOUS
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
## 2019-06-05 NOTE — ED Notes (Signed)
CRITICAL VALUE STICKER  CRITICAL VALUE: Lactic 2.7  RECEIVER (on-site recipient of call):  DATE & TIME NOTIFIED: 06/04/19 2354  MESSENGER (representative from lab): Ubaldo Glassing  MD NOTIFIED: Vernie Shanks PA   TIME OF NOTIFICATION: 2355

## 2019-06-05 NOTE — Progress Notes (Signed)
PT Cancellation Note  Patient Details Name: Sean Horton MRN: [id number] DOB: [birth year]   Cancelled Treatment:     Reason Eval/Treat Not Completed: Other (comment).  Awaiting clarification from Ortho for L knee (no brace vs hinged). Will likely return tomorrow.   Arielle Eber 06/05/2019, 3:22 PM
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
## 2019-06-05 NOTE — Progress Notes (Signed)
OT Cancellation Note  Patient Details Name: Sean Horton MRN: [id number] DOB: [birth year]   Cancelled Treatment:    Reason Eval/Treat Not Completed: Other (comment).  Awaiting clarification from Ortho for L knee (no brace vs hinged). Will likely return tomorrow.  Remmington Teters 06/05/2019, 12:58 PM  Lesle Chris, OTR/L Acute Rehabilitation Services [phone] [facility] pager [phone] office 06/05/2019

## 2019-06-05 NOTE — Progress Notes (Signed)
Attempted to complete admission history and questions, but pt was unable to respond verbally. Will continue to monitor and attempt to get questions answered.

## 2019-06-05 NOTE — Progress Notes (Signed)
Pt BP 93Y systolic, RN started 101BP bolus per standing orders. RN paged MD and MD asked for a manual pressure, RN did manual and got 96/54, rechecked automatic pressure and it was 82/39. MD made aware, bolus still infusing, will continue to monitor pt
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
## 2019-06-05 NOTE — Progress Notes (Signed)
RN attempted to clean pt foley, but pt refused. RN attempted again at 1030 but pt said he would clean it with the foley wipes, and refused to let RN clean it. RN observed pt cleaning foley.

## 2019-06-05 NOTE — Progress Notes (Signed)
Pharmacy Antibiotic Note  Sean Horton is a 33 y.o. male admitted on 06/04/2019 with sepsis.  Pharmacy has been consulted for cefepime and vancomycin dosing.  Plan: Cefepime 2 Gm IV q12h Vancomycin 2 Gm x1 the 1500 mg IV q24h for est AUC = 499 Goal AUC = 400-550 Scr = 3.24- elevated from baseline likely dehydrated was found down at home F/u scr/cultures/levels  Weight: 240 lb (108.9 kg)  Temp (24hrs), Avg:99.4 F (37.4 C), Min:98.2 F (36.8 C), Max:100.5 F (38.1 C)  Recent Labs  Lab 06/04/19 2122 06/04/19 2123  WBC 14.5*  --   CREATININE 3.24*  --   LATICACIDVEN  --  2.7*    Estimated Creatinine Clearance: 44.5 mL/min (A) (by C-G formula based on SCr of 3.24 mg/dL (H)).    No Known Allergies  Antimicrobials this admission: 7/8 cefepime >>  7/9 flagyl >> ED 7/9 vancmycin >>   Dose adjustments this admission:   Microbiology results:  BCx:   UCx:    Sputum:    MRSA PCR:   Thank you for allowing pharmacy to be a part of this patient's care.  Dorrene German 06/05/2019 3:05 AM
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
## 2019-06-05 NOTE — Progress Notes (Signed)
Pt came up from ED with bilat wrists restraints in place.  No order for wrists restraints seen in chart.  Restraints immediately removed.  Jacqulyn Ducking ICU/SD RN4 / Care Coordinator / Rapid Response Nurse Rapid Response Number:  [phone] ICU Charge Nurse Number:  [phone]

## 2019-06-05 NOTE — Progress Notes (Signed)
RN spoke with MD Rizwan and updated her about improvement in pt mental status. MD gave verbal order to advance pt diet to Regular. RN also asked if the neck brace could be removed since pt doesn't normally wear it at home, and MD said RN could remove the brace since pt has no upper spine injuries or fractures. RN will continue to monitor.

## 2019-06-05 NOTE — Consult Note (Addendum)
[facility] Nurse wound consult note Reason for Consult:Traumatic hypospadias from chronic indwelling foley.  Urology has consulted.  Nonintact skin to bilateral scapula with scarring to periwound and from healed lesions.  Will protect and implement moist wound healing. These are healing stage 3 pressure injuries Wound type:Pressure and trauma Pressure Injury POA: Yes Measurement: 6 cm x 6 cm scarring with 1 cm round nonintact center to bilateral scapula.   Wound OLM:BEML pink Drainage (amount, consistency, odor) minimal serosanguinous  No odor Periwound:scarring Dressing procedure/placement/frequency:Silicone foam to nonintact wounds.  Is on a pressure redistribution support surface.  Cleanse penis/meatus with soap and water.  Apply mupirocin ointment daily.   Will not follow at this time.  Please re-consult if needed.  Domenic Moras MSN, RN, FNP-BC CWON Wound, Ostomy, Continence Nurse Pager [phone]

## 2019-06-06 ENCOUNTER — Other Ambulatory Visit: Payer: Self-pay

## 2019-06-06 LAB — CBC
HCT: 28.7 % — ABNORMAL LOW (ref 39.0–52.0)
Hemoglobin: 8.4 g/dL — ABNORMAL LOW (ref 13.0–17.0)
MCH: 25.1 pg — ABNORMAL LOW (ref 26.0–34.0)
MCHC: 29.3 g/dL — ABNORMAL LOW (ref 30.0–36.0)
MCV: 85.9 fL (ref 80.0–100.0)
Platelets: 238 10*3/uL (ref 150–400)
RBC: 3.34 MIL/uL — ABNORMAL LOW (ref 4.22–5.81)
RDW: 15 % (ref 11.5–15.5)
WBC: 8.2 10*3/uL (ref 4.0–10.5)
nRBC: 0 % (ref 0.0–0.2)

## 2019-06-06 LAB — GLUCOSE, BODY FLUID OTHER: Glucose, Body Fluid Other: 15 mg/dL

## 2019-06-06 LAB — VITAMIN B12: Vitamin B-12: 273 pg/mL (ref 180–914)

## 2019-06-06 LAB — CREATININE, SERUM
Creatinine, Ser: 1.99 mg/dL — ABNORMAL HIGH (ref 0.61–1.24)
GFR calc Af Amer: 50 mL/min — ABNORMAL LOW (ref >60)
GFR calc non Af Amer: 43 mL/min — ABNORMAL LOW (ref >60)

## 2019-06-06 MED ORDER — SODIUM CHLORIDE 0.9 % IV BOLUS
500.0000 mL | Freq: Once | INTRAVENOUS | Status: AC
  Administered 2019-06-06: 500 mL via INTRAVENOUS

## 2019-06-06 MED ORDER — PREGABALIN 100 MG PO CAPS
200.0000 mg | ORAL_CAPSULE | Freq: Two times a day (BID) | ORAL | Status: DC
  Administered 2019-06-06 – 2019-06-07 (×3): 200 mg via ORAL
  Filled 2019-06-06 (×3): qty 2

## 2019-06-06 MED ORDER — SODIUM CHLORIDE 0.9 % IV SOLN
INTRAVENOUS | Status: DC | PRN
  Administered 2019-06-06: 23:00:00 500 mL via INTRAVENOUS

## 2019-06-06 MED ORDER — VANCOMYCIN HCL IN DEXTROSE 1-5 GM/200ML-% IV SOLN
1000.0000 mg | Freq: Two times a day (BID) | INTRAVENOUS | Status: DC
  Administered 2019-06-06: 23:00:00 1000 mg via INTRAVENOUS
  Filled 2019-06-06 (×2): qty 200

## 2019-06-06 MED ORDER — POLYETHYLENE GLYCOL 3350 17 G PO PACK
17.0000 g | PACK | Freq: Every day | ORAL | Status: DC
  Administered 2019-06-06 – 2019-06-07 (×2): 17 g via ORAL
  Filled 2019-06-06 (×2): qty 1

## 2019-06-06 MED ORDER — DULOXETINE HCL 60 MG PO CPEP
60.0000 mg | ORAL_CAPSULE | Freq: Two times a day (BID) | ORAL | Status: DC
  Administered 2019-06-06 – 2019-06-07 (×3): 60 mg via ORAL
  Filled 2019-06-06: qty 1
  Filled 2019-06-06: qty 2
  Filled 2019-06-06: qty 1

## 2019-06-06 MED ORDER — BACLOFEN 20 MG PO TABS
20.0000 mg | ORAL_TABLET | Freq: Two times a day (BID) | ORAL | Status: DC
  Administered 2019-06-06 – 2019-06-07 (×3): 20 mg via ORAL
  Filled 2019-06-06: qty 1
  Filled 2019-06-06: qty 2
  Filled 2019-06-06: qty 1

## 2019-06-06 MED ORDER — SODIUM CHLORIDE 0.9 % IV SOLN
2.0000 g | Freq: Three times a day (TID) | INTRAVENOUS | Status: DC
  Administered 2019-06-06 – 2019-06-07 (×3): 2 g via INTRAVENOUS
  Filled 2019-06-06 (×5): qty 2

## 2019-06-06 MED ORDER — POLYETHYLENE GLYCOL 3350 17 GM/SCOOP PO POWD
17.0000 g | Freq: Every day | ORAL | Status: DC
  Filled 2019-06-06: qty 255

## 2019-06-06 NOTE — Progress Notes (Signed)
OT Cancellation Note  Patient Details Name: Sean Horton MRN: [id number] DOB: 1986/09/22   Cancelled Treatment:    Reason Eval/Treat Not Completed: Pain limiting ability to participate. Pt with R back and leg nerve pain. Stated lyrica just restarted:  Pain is not controlled. Did not feel up to therapy today. Will try to check back over the weekend as schedule permits.  Madison Direnzo 06/06/2019, 12:31 PM  Lesle Chris, OTR/L Acute Rehabilitation Services [phone] [facility] pager [phone] office 06/06/2019

## 2019-06-06 NOTE — Progress Notes (Addendum)
Pharmacy Antibiotic Note  Sean Horton is a 33 y.o. male admitted on 06/04/2019 with sepsis d/t suspected UTI source. Pharmacy has been consulted for cefepime and vancomycin dosing. Patient found down, had long lie on floor; noted to have AKI and rhabdo on admit.  Today, 06/06/2019:  SCr continues to improve  WBC normalized  Last fever 7/8 PM  Cx remain negative  Plan:  Increase cefepime to 2g IV q8 hr  Increase vancomycin to 1g IV q12 hr (AUC 437 w/ SCr 1.99, Vd 0.72)  SCr daily  Height: 6' 5.01" (195.6 cm) Weight: 233 lb 11 oz (106 kg) IBW/kg (Calculated) : 89.12  Temp (24hrs), Avg:99 F (37.2 C), Min:98.2 F (36.8 C), Max:99.6 F (37.6 C)  Recent Labs  Lab 06/04/19 2122 06/04/19 2123 06/05/19 0247 06/05/19 0434 06/05/19 0705 06/05/19 1257 06/06/19 0212  WBC 14.5*  --   --  10.4  --   --  8.2  CREATININE 3.24*  --   --  2.57*  --  2.33* 1.99*  LATICACIDVEN  --  2.7* 1.4  --  1.0  --   --     Estimated Creatinine Clearance: 66.5 mL/min (A) (by C-G formula based on SCr of 1.99 mg/dL (H)).    No Known Allergies  Antimicrobials this admission: 7/9 cefepime >>  7/9 vancomycin >>   Dose adjustments this admission: 7/10 incr vanc to 1g q12 w/ improved SCr, cefepime to 2g q8  Microbiology results: 7/8 BCx: sent 7/8 UCx: reincubated  7/9 MRSA PCR: neg  7/9 L knee synovial: ngtd  Thank you for allowing pharmacy to be a part of this patient's care.  Reuel Boom, PharmD, BCPS [phone] 06/06/2019, 10:59 AM
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
## 2019-06-06 NOTE — Progress Notes (Signed)
PT Cancellation Note  Patient Details Name: Sean Horton MRN: [id number] DOB: May 26, 1986   Cancelled Treatment:    Reason Eval/Treat Not Completed: Pain limiting ability to participate - pt reports severe burning pain of gluteal region, just started Lyrica back today. Pt requesting PT check back tomorrow. Will follow up then.  Julien Girt, PT Acute Rehabilitation Services Pager [phone]  Office [phone]    Roxine Caddy D Elonda Husky 06/06/2019, 12:28 PM

## 2019-06-07 DIAGNOSIS — S82125A Nondisplaced fracture of lateral condyle of left tibia, initial encounter for closed fracture: Secondary | ICD-10-CM

## 2019-06-07 LAB — URINE CULTURE: Culture: 7000 — AB

## 2019-06-07 LAB — CREATININE, SERUM
Creatinine, Ser: 1.46 mg/dL — ABNORMAL HIGH (ref 0.61–1.24)
GFR calc Af Amer: >60 mL/min (ref >60)
GFR calc non Af Amer: >60 mL/min (ref >60)

## 2019-06-07 MED ORDER — OXYCODONE HCL 5 MG PO TABS
5.0000 mg | ORAL_TABLET | Freq: Once | ORAL | Status: AC
  Administered 2019-06-07: 5 mg via ORAL
  Filled 2019-06-07: qty 1

## 2019-06-07 MED ORDER — FEXOFENADINE HCL 180 MG PO TABS
180.0000 mg | ORAL_TABLET | Freq: Every day | ORAL | Status: DC
  Administered 2019-06-07: 180 mg via ORAL
  Filled 2019-06-07: qty 1

## 2019-06-07 MED ORDER — AMOXICILLIN 500 MG PO CAPS: 500.0000 mg | ORAL_CAPSULE | Freq: Three times a day (TID) | ORAL | 0 refills | Status: DC

## 2019-06-07 MED ORDER — AMOXICILLIN 500 MG PO CAPS
500.0000 mg | ORAL_CAPSULE | Freq: Three times a day (TID) | ORAL | Status: DC
  Administered 2019-06-07: 500 mg via ORAL
  Filled 2019-06-07: qty 1

## 2019-06-07 MED ORDER — HYDROMORPHONE HCL 1 MG/ML IJ SOLN
0.5000 mg | Freq: Once | INTRAMUSCULAR | Status: AC
  Administered 2019-06-07: 0.5 mg via INTRAVENOUS
  Filled 2019-06-07: qty 0.5

## 2019-06-07 MED ORDER — BISACODYL 5 MG PO TBEC: 10.0000 mg | DELAYED_RELEASE_TABLET | Freq: Every day | ORAL | Status: DC | PRN
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
## 2019-06-07 NOTE — Discharge Summary (Signed)
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 SARS-CoV-2 target nucleic acids are NOT DETECTED. The SARS-CoV-2 RNA is generally detectable in upper and lower  respiratory specimens during the acute phase of infection. The lowest  concentration of SARS-CoV-2 viral copies this assay can detect is 250  copies / mL. A negative result does not preclude SARS-CoV-2  infection  and should not be used as the sole basis for treatment or other  patient management decisions.  A negative result may occur with  improper specimen collection / handling, submission of specimen other  than nasopharyngeal swab, presence of viral mutation(s) within the  areas targeted by this assay, and inadequate number of viral copies  (<250 copies / mL). A negative result must be combined with clinical  observations, patient history, and epidemiological information. If result is POSITIVE SARS-CoV-2 target nucleic acids are DETECTED. The SARS-CoV-2 RNA is generally detectable in upper and lower  respiratory specimens dur ing the acute phase of infection.  Positive  results are indicative of active infection with SARS-CoV-2.  Clinical  correlation with patient history and other diagnostic information is  necessary to determine patient infection status.  Positive results do  not rule out bacterial infection or co-infection with other viruses. If result is PRESUMPTIVE POSTIVE SARS-CoV-2 nucleic acids MAY BE PRESENT.   A presumptive positive result was obtained on the submitted specimen  and confirmed on repeat testing.  While 2019 novel coronavirus  (SARS-CoV-2) nucleic acids may be present in the submitted sample  additional confirmatory testing may be necessary for epidemiological  and / or clinical management purposes  to differentiate between  SARS-CoV-2 and other Sarbecovirus currently known to infect humans.  If clinically indicated additional testing with an alternate test  methodology [phone]) is advised. The SARS-CoV-2 RNA is generally  detectable in upper and lower respiratory sp ecimens during the acute  phase of infection. The expected result is Negative. Fact Sheet for Patients:  StrictlyIdeas.no Fact Sheet for Healthcare Providers: BankingDealers.co.za This test is not yet approved or cleared by the [place]  FDA and has been authorized for detection and/or diagnosis of SARS-CoV-2 by FDA under an Emergency Use Authorization (EUA).  This EUA will remain in effect (meaning this test can be used) for the duration of the COVID-19 declaration under Section 564(b)(1) of the Act, 21 U.S.C. section 360bbb-3(b)(1),  unless the authorization is terminated or revoked sooner. Performed at [facility], [place] [address], [place], [place] [address]   Urine culture     Status: Abnormal   Collection Time: 06/05/19 12:59 AM   Specimen: In/Out Cath Urine  Result Value Ref Range Status   Specimen Description   Final    IN/OUT CATH URINE Performed at [facility] [address], [place], [place] [address]    Special Requests   Final    NONE Performed at [facility], [place] [address], [place], [place] [address]    Culture 7,000 COLONIES/mL ENTEROCOCCUS FAECALIS (A)  Final   Report Status 06/07/2019 FINAL  Final   Organism ID, Bacteria ENTEROCOCCUS FAECALIS (A)  Final      Susceptibility   Enterococcus faecalis - MIC*    AMPICILLIN <=2 SENSITIVE Sensitive     LEVOFLOXACIN >=8 RESISTANT Resistant     NITROFURANTOIN 32 SENSITIVE Sensitive     VANCOMYCIN 1 SENSITIVE Sensitive     * 7,000 COLONIES/mL ENTEROCOCCUS FAECALIS  Body fluid culture     Status: None (Preliminary result)   Collection Time: 06/05/19  1:27 AM   Specimen: Synovium; Synovial Fluid  Result Value Ref Range Status   Specimen Description   Final    SYNOVIAL LEFT KNEE Performed at [facility] [address] [place], [place] [address]    Special Requests   Final    NONE Performed at [facility], [place] [address], [place], [place] [address]    Gram Stain   Final    ABUNDANT WBC PRESENT,BOTH PMN AND MONONUCLEAR NO ORGANISMS SEEN Gram Stain Report Called to,Read Back By and Verified With: OXENDINE,J @ 2751 ON [id number] BY POTEAT,S Performed at  [facility], [place] [address], [place], [place] [address]    Culture   Final    NO GROWTH 2 DAYS Performed at [facility] [address], [place], [place] [address]    Report Status PENDING  Incomplete  MRSA PCR Screening     Status: None   Collection Time: 06/05/19  1:53 AM   Specimen: Nasal Mucosa; Nasopharyngeal  Result Value Ref Range Status   MRSA by PCR NEGATIVE NEGATIVE Final    Comment:        The GeneXpert MRSA Assay (FDA approved for NASAL specimens only), is one component of a comprehensive MRSA colonization surveillance program. It is not intended to diagnose MRSA infection nor to guide or monitor treatment for MRSA infections. Performed at [facility], [place] [address] [place], [place] [address]      Time coordinating discharge in minutes: 68  SIGNED:   Debbe Odea, MD  [facility] 06/07/2019, 10:09 AM Pager   If 7PM-7AM, please contact night-coverage [email or web address] Password [email or web address]
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
## 2019-06-07 NOTE — Plan of Care (Signed)
  Problem: Fluid Volume: Goal: Hemodynamic stability will improve Outcome: Progressing   Problem: Clinical Measurements: Goal: Signs and symptoms of infection will decrease Outcome: Progressing   Problem: Respiratory: Goal: Ability to maintain adequate ventilation will improve Outcome: Progressing   Problem: Education: Goal: Knowledge of General Education information will improve Description: Including pain rating scale, medication(s)/side effects and non-pharmacologic comfort measures Outcome: Progressing   Problem: Nutrition: Goal: Adequate nutrition will be maintained Outcome: Progressing   Problem: Elimination: Goal: Will not experience complications related to bowel motility Outcome: Progressing Note: Patient complains of constipation. RN gave Dulcolax suppository.   Problem: Pain Managment: Goal: General experience of comfort will improve Outcome: Progressing   Problem: Safety: Goal: Ability to remain free from injury will improve Outcome: Progressing

## 2019-06-07 NOTE — Discharge Instructions (Signed)
Put on the knee brace only when you are not in bed.   You were cared for by a hospitalist during your hospital stay. If you have any questions about your discharge medications or the care you received while you were in the hospital after you are discharged, you can call the unit and asked to speak with the hospitalist on call if the hospitalist that took care of you is not available. Once you are discharged, your primary care physician will handle any further medical issues.   Please note that NO REFILLS for any discharge medications will be authorized once you are discharged, as it is imperative that you return to your primary care physician (or establish a relationship with a primary care physician if you do not have one) for your aftercare needs so that they can reassess your need for medications and monitor your lab values.  Please take all your medications with you for your next visit with your Primary MD. Please ask your Primary MD to get all Hospital records sent to his/her office. Please request your Primary MD to go over all hospital test results at the follow up.   If you experience worsening of your admission symptoms, develop shortness of breath, chest pain, suicidal or homicidal thoughts or a life threatening emergency, you must seek medical attention immediately by calling 911 or calling your MD.   Dennis Bast must read the complete instructions/literature along with all the possible adverse reactions/side effects for all the medicines you take including new medications that have been prescribed to you. Take new medicines after you have completely understood and accpet all the possible adverse reactions/side effects.    Do not drive when taking pain medications or sedatives.     Do not take more than prescribed Pain, Sleep and Anxiety Medications   If you have smoked or chewed Tobacco in the last 2 yrs please stop. Stop any regular alcohol  and or recreational drug use.   Wear Seat belts  while driving.

## 2019-06-08 LAB — BODY FLUID CULTURE: Culture: NO GROWTH

## 2019-06-10 LAB — CULTURE, BLOOD (ROUTINE X 2)
Culture: NO GROWTH
Culture: NO GROWTH
Special Requests: ADEQUATE
Special Requests: ADEQUATE

## 2019-06-17 ENCOUNTER — Other Ambulatory Visit: Payer: Self-pay

## 2019-06-17 ENCOUNTER — Encounter: Payer: Self-pay | Admitting: Neurology

## 2019-06-17 ENCOUNTER — Ambulatory Visit: Payer: Medicaid Other | Admitting: Neurology

## 2019-06-17 VITALS — BP 106/65 | HR 84 | Temp 99.3°F

## 2019-06-17 DIAGNOSIS — G8220 Paraplegia, unspecified: Secondary | ICD-10-CM

## 2019-06-17 DIAGNOSIS — R202 Paresthesia of skin: Secondary | ICD-10-CM | POA: Diagnosis not present
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
## 2019-06-18 ENCOUNTER — Telehealth: Payer: Self-pay | Admitting: Neurology

## 2019-06-18 ENCOUNTER — Other Ambulatory Visit: Payer: Self-pay

## 2019-06-18 MED ORDER — HYDRALAZINE HCL 25 MG PO TABS: ORAL_TABLET | ORAL | 0 refills | Status: DC

## 2019-06-18 MED ORDER — PREGABALIN 100 MG PO CAPS: 200.0000 mg | ORAL_CAPSULE | Freq: Two times a day (BID) | ORAL | 2 refills | Status: DC

## 2019-06-18 NOTE — Telephone Encounter (Signed)
Medicaid order sent to GI. They will obtain the auth and reach out to the patient to schedule.  

## 2019-06-23 ENCOUNTER — Other Ambulatory Visit: Payer: Self-pay | Admitting: Anesthesiology

## 2019-06-24 ENCOUNTER — Other Ambulatory Visit: Payer: Self-pay | Admitting: Urology

## 2019-06-25 ENCOUNTER — Encounter (HOSPITAL_COMMUNITY): Payer: Self-pay | Admitting: Physician Assistant

## 2019-06-28 HISTORY — PX: OTHER SURGICAL HISTORY: SHX169

## 2019-07-01 NOTE — Progress Notes (Signed)
Spoke with patient by phone patient to delay surgery for insurance reasons.patient given connie mabe number to call.

## 2019-07-02 ENCOUNTER — Encounter (HOSPITAL_COMMUNITY): Admission: RE | Payer: Self-pay | Source: Home / Self Care

## 2019-07-02 ENCOUNTER — Ambulatory Visit (HOSPITAL_COMMUNITY): Admission: RE | Admit: 2019-07-02 | Payer: Medicaid Other | Source: Home / Self Care | Admitting: Urology

## 2019-07-02 SURGERY — CYSTOSCOPY
Anesthesia: General

## 2019-07-10 ENCOUNTER — Other Ambulatory Visit: Payer: Self-pay | Admitting: Urology

## 2019-07-17 NOTE — Patient Instructions (Addendum)
YOU NEED TO HAVE A COVID 19 TEST ON_Saturday 8/22______ @_11 :15______, THIS TEST MUST BE DONE BEFORE SURGERY, COME  [place] , [address]. ONCE YOUR COVID TEST IS COMPLETED, PLEASE BEGIN THE QUARANTINE INSTRUCTIONS AS OUTLINED IN YOUR HANDOUT.                [place]    Your procedure is scheduled on:Wednesday 07/23/19    Report to [facility] Main  Entrance Report to admitting at 7:15  AM   1 VISITOR IS ALLOWED TO WAIT IN WAITING ROOM  ONLY DAY OF YOUR SURGERY.  NO VISITORS ARE ALLOWED IN SHORT STAY OR RECOVERY ROOM.   Call this number if you have problems the morning of surgery [place] AND RINSE YOUR MOUTH OUT, NO CHEWING GUM CANDY OR MINTS.     Take these medicines the morning of surgery with A SIP OF WATER: Lyrica,Cymbalta,Allegra,Hydralazine,Prilosec,Xtampza ER                                 You may not have any metal on your body including piercings             Do not wear jewelry, lotions, powders or , deodorant                          Men may shave face and neck.   Do not bring valuables to the hospital. [place].  Contacts, dentures or bridgework may not be worn into surgery.      Patients discharged the day of surgery will not be allowed to drive home.  IF YOU ARE HAVING SURGERY AND GOING HOME THE SAME DAY, YOU MUST HAVE AN ADULT TO DRIVE YOU HOME AND BE WITH YOU FOR 24 HOURS.  YOU MAY GO HOME BY TAXI OR UBER OR ORTHERWISE, BUT AN ADULT MUST ACCOMPANY YOU HOME AND STAY WITH YOU FOR 24 HOURS.  Name and phone number of your driver:  Special Instructions: N/A              Please read over the following fact sheets you were given: _____________________________________________________________________             [facility] - Preparing for Surgery Before surgery, you can play an important role.   Because skin is not sterile, your  skin needs to be as free of germs as possible.   You can reduce the number of germs on your skin by washing with CHG (chlorahexidine gluconate) soap before surgery .  CHG is an antiseptic cleaner which kills germs and bonds with the skin to continue killing germs even after washing. Please DO NOT use if you have an allergy to CHG or antibacterial soaps.   If your skin becomes reddened/irritated stop using the CHG and inform your nurse when you arrive at Short Stay.   You may shave your face/neck . Please follow these instructions carefully:  1.  Shower with CHG Soap the night before surgery and the  morning of Surgery.  2.  If you choose to wash your hair, wash your hair first as usual with your  normal  shampoo.  3.  After you shampoo, rinse your hair and body thoroughly to  remove the  shampoo.                                        4.  Use CHG as you would any other liquid soap.  You can apply chg directly  to the skin and wash                       Gently with a scrungie or clean washcloth.  5.  Apply the CHG Soap to your body ONLY FROM THE NECK DOWN.   Do not use on face/ open                           Wound or open sores. Avoid contact with eyes, ears mouth and genitals (private parts).                       Wash face,  Genitals (private parts) with your normal soap.             6.  Wash thoroughly, paying special attention to the area where your surgery  will be performed.  7.  Thoroughly rinse your body with warm water from the neck down.  8.  DO NOT shower/wash with your normal soap after using and rinsing off  the CHG Soap.             9.  Pat yourself dry with a clean towel.            10.  Wear clean pajamas.            11.  Place clean sheets on your bed the night of your first shower and do not  sleep with pets. Day of Surgery : Do not apply any lotions/deodorants the morning of surgery.  Please wear clean clothes to the hospital/surgery center.   FAILURE TO FOLLOW THESE  INSTRUCTIONS MAY RESULT IN THE CANCELLATION OF YOUR SURGERY PATIENT SIGNATURE_________________________________  NURSE SIGNATURE__________________________________  ________________________________________________________________________   Sean Horton  An incentive spirometer is a tool that can help keep your lungs clear and active. This tool measures how well you are filling your lungs with each breath. Taking long deep breaths may help reverse or decrease the chance of developing breathing (pulmonary) problems (especially infection) following:  A long period of time when you are unable to move or be active. BEFORE THE PROCEDURE   If the spirometer includes an indicator to show your best effort, your nurse or respiratory therapist will set it to a desired goal.  If possible, sit up straight or lean slightly forward. Try not to slouch.  Hold the incentive spirometer in an upright position. INSTRUCTIONS FOR USE  1. Sit on the edge of your bed if possible, or sit up as far as you can in bed or on a chair. 2. Hold the incentive spirometer in an upright position. 3. Breathe out normally. 4. Place the mouthpiece in your mouth and seal your lips tightly around it. 5. Breathe in slowly and as deeply as possible, raising the piston or the ball toward the top of the column. 6. Hold your breath for 3-5 seconds or for as long as possible. Allow the piston or ball to fall to the bottom of the column. 7. Remove the mouthpiece from your mouth and breathe out normally. 8. Rest for  a few seconds and repeat Steps 1 through 7 at least 10 times every 1-2 hours when you are awake. Take your time and take a few normal breaths between deep breaths. 9. The spirometer may include an indicator to show your best effort. Use the indicator as a goal to work toward during each repetition. 10. After each set of 10 deep breaths, practice coughing to be sure your lungs are clear. If you have an incision (the  cut made at the time of surgery), support your incision when coughing by placing a pillow or rolled up towels firmly against it. Once you are able to get out of bed, walk around indoors and cough well. You may stop using the incentive spirometer when instructed by your caregiver.  RISKS AND COMPLICATIONS  Take your time so you do not get dizzy or light-headed.  If you are in pain, you may need to take or ask for pain medication before doing incentive spirometry. It is harder to take a deep breath if you are having pain. AFTER USE  Rest and breathe slowly and easily.  It can be helpful to keep track of a log of your progress. Your caregiver can provide you with a simple table to help with this. If you are using the spirometer at home, follow these instructions: [place] IF:   You are having difficultly using the spirometer.  You have trouble using the spirometer as often as instructed.  Your pain medication is not giving enough relief while using the spirometer.  You develop fever of 100.5 F (38.1 C) or higher. SEEK IMMEDIATE MEDICAL CARE IF:   You cough up bloody sputum that had not been present before.  You develop fever of 102 F (38.9 C) or greater.  You develop worsening pain at or near the incision site. MAKE SURE YOU:   Understand these instructions.  Will watch your condition.  Will get help right away if you are not doing well or get worse. Document Released: 03/26/2007 Document Revised: 02/05/2012 Document Reviewed: 05/27/2007 [facility] Patient Information 2014 [place], [place].   ________________________________________________________________________

## 2019-07-18 ENCOUNTER — Other Ambulatory Visit: Payer: Self-pay

## 2019-07-18 ENCOUNTER — Encounter (HOSPITAL_COMMUNITY): Payer: Self-pay

## 2019-07-18 ENCOUNTER — Encounter (HOSPITAL_COMMUNITY)
Admission: RE | Admit: 2019-07-18 | Discharge: 2019-07-18 | Disposition: A | Discharge status: Home / Self Care | Payer: Medicaid Other | Source: Ambulatory Visit | Attending: Urology | Admitting: Urology

## 2019-07-18 DIAGNOSIS — Z01812 Encounter for preprocedural laboratory examination: Secondary | ICD-10-CM | POA: Diagnosis not present

## 2019-07-18 DIAGNOSIS — N319 Neuromuscular dysfunction of bladder, unspecified: Secondary | ICD-10-CM | POA: Insufficient documentation

## 2019-07-18 HISTORY — DX: Essential (primary) hypertension: I10

## 2019-07-18 HISTORY — DX: Myoneural disorder, unspecified: G70.9

## 2019-07-18 LAB — BASIC METABOLIC PANEL
Anion gap: 10 (ref 5–15)
BUN: 30 mg/dL — ABNORMAL HIGH (ref 6–20)
CO2: 26 mmol/L (ref 22–32)
Calcium: 10.2 mg/dL (ref 8.9–10.3)
Chloride: 103 mmol/L (ref 98–111)
Creatinine, Ser: 1.02 mg/dL (ref 0.61–1.24)
GFR calc Af Amer: >60 mL/min (ref >60)
GFR calc non Af Amer: >60 mL/min (ref >60)
Glucose, Bld: 87 mg/dL (ref 70–99)
Potassium: 4.8 mmol/L (ref 3.5–5.1)
Sodium: 139 mmol/L (ref 135–145)

## 2019-07-18 LAB — CBC
HCT: 43.6 % (ref 39.0–52.0)
Hemoglobin: 13 g/dL (ref 13.0–17.0)
MCH: 25.7 pg — ABNORMAL LOW (ref 26.0–34.0)
MCHC: 29.8 g/dL — ABNORMAL LOW (ref 30.0–36.0)
MCV: 86.2 fL (ref 80.0–100.0)
Platelets: 372 10*3/uL (ref 150–400)
RBC: 5.06 MIL/uL (ref 4.22–5.81)
RDW: 14.6 % (ref 11.5–15.5)
WBC: 7.7 10*3/uL (ref 4.0–10.5)
nRBC: 0 % (ref 0.0–0.2)

## 2019-07-18 NOTE — Progress Notes (Signed)
PCP - Dr. Marzetta Merino Cardiologist - none  Chest x-ray - 06/05/19 EKG - 06/05/19 Stress Test - none ECHO - none Cardiac Cath - none  Sleep Study -NA  CPAP -   Fasting Blood Sugar - NA Checks Blood Sugar _____ times a day  Blood Thinner Instructions:NA Aspirin Instructions: Last Dose:  Anesthesia review:   Patient denies shortness of breath, fever, cough and chest pain at PAT appointment. Yes. Pt's Bp was elevated at PAT visit. He states that it happens occasionally and uses Hydralazine if needed   Patient verbalized understanding of instructions that were given to them at the PAT appointment. Patient was also instructed that they will need to review over the PAT instructions again at home before surgery. yes

## 2019-07-19 ENCOUNTER — Other Ambulatory Visit (HOSPITAL_COMMUNITY)
Admission: RE | Admit: 2019-07-19 | Discharge: 2019-07-19 | Disposition: A | Discharge status: Home / Self Care | Payer: Medicaid Other | Source: Ambulatory Visit | Attending: Urology | Admitting: Urology

## 2019-07-19 DIAGNOSIS — Z01812 Encounter for preprocedural laboratory examination: Secondary | ICD-10-CM | POA: Diagnosis not present

## 2019-07-19 DIAGNOSIS — Z20828 Contact with and (suspected) exposure to other viral communicable diseases: Secondary | ICD-10-CM | POA: Diagnosis not present

## 2019-07-20 LAB — SARS CORONAVIRUS 2 (TAT 6-24 HRS): SARS Coronavirus 2: NEGATIVE

## 2019-07-23 ENCOUNTER — Encounter (HOSPITAL_COMMUNITY): Payer: Self-pay | Admitting: Anesthesiology

## 2019-07-23 ENCOUNTER — Ambulatory Visit (HOSPITAL_COMMUNITY): Payer: Medicaid Other | Admitting: Physician Assistant

## 2019-07-23 ENCOUNTER — Encounter (HOSPITAL_COMMUNITY)
Admission: RE | Disposition: A | Discharge status: Home / Self Care | Payer: Self-pay | Source: Home / Self Care | Attending: Urology

## 2019-07-23 ENCOUNTER — Ambulatory Visit (HOSPITAL_COMMUNITY): Payer: Medicaid Other | Admitting: Anesthesiology

## 2019-07-23 ENCOUNTER — Other Ambulatory Visit: Payer: Self-pay

## 2019-07-23 ENCOUNTER — Ambulatory Visit (HOSPITAL_COMMUNITY)
Admission: RE | Admit: 2019-07-23 | Discharge: 2019-07-23 | Disposition: A | Discharge status: Home / Self Care | Payer: Medicaid Other | Attending: Urology | Admitting: Urology

## 2019-07-23 DIAGNOSIS — K219 Gastro-esophageal reflux disease without esophagitis: Secondary | ICD-10-CM | POA: Diagnosis not present

## 2019-07-23 DIAGNOSIS — G8220 Paraplegia, unspecified: Secondary | ICD-10-CM | POA: Diagnosis not present

## 2019-07-23 DIAGNOSIS — F329 Major depressive disorder, single episode, unspecified: Secondary | ICD-10-CM | POA: Insufficient documentation

## 2019-07-23 DIAGNOSIS — Z87440 Personal history of urinary (tract) infections: Secondary | ICD-10-CM | POA: Insufficient documentation

## 2019-07-23 DIAGNOSIS — F419 Anxiety disorder, unspecified: Secondary | ICD-10-CM | POA: Insufficient documentation

## 2019-07-23 DIAGNOSIS — Z87891 Personal history of nicotine dependence: Secondary | ICD-10-CM | POA: Diagnosis not present

## 2019-07-23 DIAGNOSIS — Z87828 Personal history of other (healed) physical injury and trauma: Secondary | ICD-10-CM | POA: Insufficient documentation

## 2019-07-23 DIAGNOSIS — N319 Neuromuscular dysfunction of bladder, unspecified: Secondary | ICD-10-CM | POA: Diagnosis not present

## 2019-07-23 DIAGNOSIS — Z79899 Other long term (current) drug therapy: Secondary | ICD-10-CM | POA: Insufficient documentation

## 2019-07-23 DIAGNOSIS — I10 Essential (primary) hypertension: Secondary | ICD-10-CM | POA: Insufficient documentation

## 2019-07-23 DIAGNOSIS — N3020 Other chronic cystitis without hematuria: Secondary | ICD-10-CM | POA: Diagnosis not present

## 2019-07-23 HISTORY — PX: CYSTOSCOPY: SHX5120

## 2019-07-23 SURGERY — CYSTOSCOPY
Anesthesia: General

## 2019-07-23 MED ORDER — PROMETHAZINE HCL 25 MG/ML IJ SOLN: 6.2500 mg | INTRAMUSCULAR | Status: DC | PRN

## 2019-07-23 MED ORDER — ACETAMINOPHEN 160 MG/5ML PO SOLN: 325.0000 mg | Freq: Once | ORAL | Status: DC | PRN

## 2019-07-23 MED ORDER — ONDANSETRON HCL 4 MG/2ML IJ SOLN
INTRAMUSCULAR | Status: DC | PRN
  Administered 2019-07-23: 4 mg via INTRAVENOUS

## 2019-07-23 MED ORDER — DEXAMETHASONE SODIUM PHOSPHATE 4 MG/ML IJ SOLN
INTRAMUSCULAR | Status: DC | PRN
  Administered 2019-07-23: 10 mg via INTRAVENOUS

## 2019-07-23 MED ORDER — GLYCOPYRROLATE PF 0.2 MG/ML IJ SOSY
PREFILLED_SYRINGE | INTRAMUSCULAR | Status: AC
  Filled 2019-07-23: qty 2

## 2019-07-23 MED ORDER — ACETAMINOPHEN 10 MG/ML IV SOLN: 1000.0000 mg | Freq: Once | INTRAVENOUS | Status: DC | PRN

## 2019-07-23 MED ORDER — FENTANYL CITRATE (PF) 100 MCG/2ML IJ SOLN
25.0000 ug | INTRAMUSCULAR | Status: DC | PRN
  Administered 2019-07-23 (×2): 50 ug via INTRAVENOUS

## 2019-07-23 MED ORDER — BUPIVACAINE HCL (PF) 0.5 % IJ SOLN
INTRAMUSCULAR | Status: AC
  Filled 2019-07-23: qty 30

## 2019-07-23 MED ORDER — FENTANYL CITRATE (PF) 100 MCG/2ML IJ SOLN
INTRAMUSCULAR | Status: AC
  Filled 2019-07-23: qty 2

## 2019-07-23 MED ORDER — EPHEDRINE SULFATE 50 MG/ML IJ SOLN
INTRAMUSCULAR | Status: DC | PRN
  Administered 2019-07-23: 5 mg via INTRAVENOUS
  Administered 2019-07-23 (×2): 10 mg via INTRAVENOUS

## 2019-07-23 MED ORDER — PROPOFOL 10 MG/ML IV BOLUS
INTRAVENOUS | Status: AC
  Filled 2019-07-23: qty 20

## 2019-07-23 MED ORDER — BUPIVACAINE-EPINEPHRINE 0.5% -1:200000 IJ SOLN
INTRAMUSCULAR | Status: DC | PRN
  Administered 2019-07-23: 10 mL

## 2019-07-23 MED ORDER — LACTATED RINGERS IV SOLN
INTRAVENOUS | Status: DC
  Administered 2019-07-23: 08:00:00 via INTRAVENOUS

## 2019-07-23 MED ORDER — STERILE WATER FOR IRRIGATION IR SOLN
Status: DC | PRN
  Administered 2019-07-23: 3000 mL

## 2019-07-23 MED ORDER — CEFAZOLIN SODIUM-DEXTROSE 2-4 GM/100ML-% IV SOLN
2.0000 g | Freq: Once | INTRAVENOUS | Status: AC
  Administered 2019-07-23: 09:00:00 2 g via INTRAVENOUS
  Filled 2019-07-23: qty 100

## 2019-07-23 MED ORDER — MIDAZOLAM HCL 5 MG/5ML IJ SOLN
INTRAMUSCULAR | Status: DC | PRN
  Administered 2019-07-23: 2 mg via INTRAVENOUS

## 2019-07-23 MED ORDER — MIDAZOLAM HCL 2 MG/2ML IJ SOLN
INTRAMUSCULAR | Status: AC
  Filled 2019-07-23: qty 2

## 2019-07-23 MED ORDER — LIDOCAINE HCL (CARDIAC) PF 100 MG/5ML IV SOSY
PREFILLED_SYRINGE | INTRAVENOUS | Status: DC | PRN
  Administered 2019-07-23: 100 mg via INTRAVENOUS

## 2019-07-23 MED ORDER — FENTANYL CITRATE (PF) 100 MCG/2ML IJ SOLN
INTRAMUSCULAR | Status: DC | PRN
  Administered 2019-07-23: 100 ug via INTRAVENOUS

## 2019-07-23 MED ORDER — LACTATED RINGERS IV SOLN: INTRAVENOUS | Status: DC

## 2019-07-23 MED ORDER — ACETAMINOPHEN 325 MG PO TABS: 325.0000 mg | ORAL_TABLET | Freq: Once | ORAL | Status: DC | PRN

## 2019-07-23 MED ORDER — PROPOFOL 10 MG/ML IV BOLUS
INTRAVENOUS | Status: AC
  Filled 2019-07-23: qty 40

## 2019-07-23 MED ORDER — GLYCOPYRROLATE 0.2 MG/ML IJ SOLN
INTRAMUSCULAR | Status: DC | PRN
  Administered 2019-07-23: 0.2 mg via INTRAVENOUS

## 2019-07-23 MED ORDER — PROPOFOL 10 MG/ML IV BOLUS
INTRAVENOUS | Status: DC | PRN
  Administered 2019-07-23: 250 mg via INTRAVENOUS

## 2019-07-23 MED ORDER — BUPIVACAINE-EPINEPHRINE 0.5% -1:200000 IJ SOLN
INTRAMUSCULAR | Status: AC
  Filled 2019-07-23: qty 1

## 2019-07-23 MED ORDER — MEPERIDINE HCL 50 MG/ML IJ SOLN: 6.2500 mg | INTRAMUSCULAR | Status: DC | PRN

## 2019-07-23 SURGICAL SUPPLY — 30 items
BAG URINE DRAINAGE (UROLOGICAL SUPPLIES) ×3 IMPLANT
BAG URINE LEG 500ML (DRAIN) ×3 IMPLANT
BLADE SURG 15 STRL LF DISP TIS (BLADE) ×1 IMPLANT
BLADE SURG 15 STRL SS (BLADE) ×2
CATH FOLEY 2WAY SLVR  5CC 16FR (CATHETERS) ×2
CATH FOLEY 2WAY SLVR 5CC 16FR (CATHETERS) ×1 IMPLANT
CATH FOLEY INTRO SUPRA 16F (CATHETERS) ×3 IMPLANT
COUNTER NEEDLE 20 DBL MAG RED (NEEDLE) ×3 IMPLANT
COVER SURGICAL LIGHT HANDLE (MISCELLANEOUS) ×3 IMPLANT
COVER WAND RF STERILE (DRAPES) IMPLANT
ELECT PENCIL ROCKER SW 15FT (MISCELLANEOUS) ×3 IMPLANT
ELECT REM PT RETURN 15FT ADLT (MISCELLANEOUS) ×3 IMPLANT
GAUZE 4X4 16PLY RFD (DISPOSABLE) ×3 IMPLANT
GLOVE BIOGEL M STRL SZ7.5 (GLOVE) ×3 IMPLANT
GOWN STRL REUS W/TWL XL LVL3 (GOWN DISPOSABLE) ×3 IMPLANT
KIT TURNOVER KIT A (KITS) ×3 IMPLANT
MANIFOLD NEPTUNE II (INSTRUMENTS) ×3 IMPLANT
NEEDLE HYPO 22GX1.5 SAFETY (NEEDLE) IMPLANT
NEEDLE SPNL 18GX3.5 QUINCKE PK (NEEDLE) ×3 IMPLANT
NS IRRIG 1000ML POUR BTL (IV SOLUTION) ×3 IMPLANT
PACK CYSTO (CUSTOM PROCEDURE TRAY) ×3 IMPLANT
PLUG CATH AND CAP STER (CATHETERS) ×3 IMPLANT
SPONGE DRAIN TRACH 4X4 STRL 2S (GAUZE/BANDAGES/DRESSINGS) ×3 IMPLANT
SUT SILK 2 0 SH (SUTURE) ×3 IMPLANT
SYR 10ML LL (SYRINGE) ×3 IMPLANT
SYR 20ML LL LF (SYRINGE) ×3 IMPLANT
TAPE CLOTH SURG 6X10 WHT LF (GAUZE/BANDAGES/DRESSINGS) ×3 IMPLANT
TOWEL OR 17X26 10 PK STRL BLUE (TOWEL DISPOSABLE) ×3 IMPLANT
WATER STERILE IRR 3000ML UROMA (IV SOLUTION) IMPLANT
WATER STERILE IRR 500ML POUR (IV SOLUTION) ×3 IMPLANT
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
## 2019-07-23 NOTE — Anesthesia Procedure Notes (Signed)
Procedure Name: LMA Insertion Date/Time: 07/23/2019 9:20 AM Performed by: Lavina Hamman, CRNA Pre-anesthesia Checklist: Patient identified, Emergency Drugs available, Suction available and Patient being monitored Patient Re-evaluated:Patient Re-evaluated prior to induction Oxygen Delivery Method: Circle System Utilized Preoxygenation: Pre-oxygenation with 100% oxygen Induction Type: IV induction Ventilation: Mask ventilation without difficulty LMA: LMA inserted LMA Size: 4.0 and 5.0 Number of attempts: 1 Airway Equipment and Method: Bite block Placement Confirmation: positive ETCO2 Tube secured with: Tape Dental Injury: Teeth and Oropharynx as per pre-operative assessment

## 2019-07-23 NOTE — Anesthesia Preprocedure Evaluation (Addendum)
Anesthesia Evaluation  Patient identified by MRN, date of birth, ID band Patient awake    Reviewed: Allergy & Precautions, NPO status , Patient's Chart, lab work & pertinent test results  Airway Mallampati: III  TM Distance: >3 FB Neck ROM: Full    Dental  (+) Teeth Intact, Dental Advisory Given, Poor Dentition   Pulmonary Patient abstained from smoking., former smoker,    breath sounds clear to auscultation       Cardiovascular hypertension, Pt. on medications  Rhythm:Regular Rate:Normal     Neuro/Psych  Headaches, negative psych ROS   GI/Hepatic Neg liver ROS, GERD  Medicated,  Endo/Other  negative endocrine ROS  Renal/GU      Musculoskeletal negative musculoskeletal ROS (+)   Abdominal Normal abdominal exam  (+)   Peds  Hematology negative hematology ROS (+)   Anesthesia Other Findings   Reproductive/Obstetrics                            Anesthesia Physical Anesthesia Plan  ASA: III  Anesthesia Plan: General   Post-op Pain Management:    Induction: Intravenous  PONV Risk Score and Plan: 3 and Ondansetron, Dexamethasone and Midazolam  Airway Management Planned: LMA  Additional Equipment: None  Intra-op Plan:   Post-operative Plan: Extubation in OR  Informed Consent: I have reviewed the patients History and Physical, chart, labs and discussed the procedure including the risks, benefits and alternatives for the proposed anesthesia with the patient or authorized representative who has indicated his/her understanding and acceptance.       Plan Discussed with: CRNA  Anesthesia Plan Comments:       Anesthesia Quick Evaluation
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
## 2019-07-23 NOTE — Transfer of Care (Signed)
Immediate Anesthesia Transfer of Care Note  Patient: Sean Horton  Procedure(s) Performed: Procedure(s): [place] (N/A)  Patient Location: PACU  Anesthesia Type:General  Level of Consciousness:  sedated, patient cooperative and responds to stimulation  Airway & Oxygen Therapy:Patient Spontanous Breathing and Patient connected to face mask oxgen  Post-op Assessment:  Report given to PACU RN and Post -op Vital signs reviewed and stable  Post vital signs:  Reviewed and stable  Last Vitals:  Vitals:   07/23/19 0700  BP: 130/87  Pulse: 70  Resp: 18  Temp: 37.1 C  SpO2: [id number]    Complications: No apparent anesthesia complications

## 2019-07-23 NOTE — Anesthesia Postprocedure Evaluation (Signed)
Anesthesia Post Note  Patient: Sean Horton  Procedure(s) Performed: CYSTOSCOPY WITH SUPRA PUBIC CATHETER PLACEMENT (N/A )     Patient location during evaluation: PACU Anesthesia Type: General Level of consciousness: awake and alert Pain management: pain level controlled Vital Signs Assessment: post-procedure vital signs reviewed and stable Respiratory status: spontaneous breathing, nonlabored ventilation, respiratory function stable and patient connected to nasal cannula oxygen Cardiovascular status: blood pressure returned to baseline and stable Postop Assessment: no apparent nausea or vomiting Anesthetic complications: no    Last Vitals:  Vitals:   07/23/19 1115 07/23/19 1130  BP: (!) 128/96 (!) 136/98  Pulse: 75 64  Resp: 17 19  Temp:  36.9 C  SpO2: 100% 100%                 [name]

## 2019-07-24 ENCOUNTER — Other Ambulatory Visit: Payer: Medicaid Other

## 2019-07-24 ENCOUNTER — Encounter (HOSPITAL_COMMUNITY): Payer: Self-pay | Admitting: Urology

## 2019-08-10 ENCOUNTER — Encounter (HOSPITAL_COMMUNITY): Payer: Self-pay

## 2019-08-10 ENCOUNTER — Emergency Department (HOSPITAL_COMMUNITY): Payer: Medicaid Other

## 2019-08-10 ENCOUNTER — Other Ambulatory Visit: Payer: Self-pay

## 2019-08-10 ENCOUNTER — Inpatient Hospital Stay (HOSPITAL_COMMUNITY)
Admission: EM | Admit: 2019-08-10 | Discharge: 2019-08-14 | DRG: 917 | Disposition: A | Discharge status: Home / Self Care | Payer: Medicaid Other | Attending: Internal Medicine | Admitting: Internal Medicine

## 2019-08-10 DIAGNOSIS — L89613 Pressure ulcer of right heel, stage 3: Secondary | ICD-10-CM | POA: Diagnosis present

## 2019-08-10 DIAGNOSIS — I10 Essential (primary) hypertension: Secondary | ICD-10-CM | POA: Diagnosis present

## 2019-08-10 DIAGNOSIS — N390 Urinary tract infection, site not specified: Secondary | ICD-10-CM | POA: Diagnosis not present

## 2019-08-10 DIAGNOSIS — T428X1A Poisoning by antiparkinsonism drugs and other central muscle-tone depressants, accidental (unintentional), initial encounter: Secondary | ICD-10-CM | POA: Diagnosis present

## 2019-08-10 DIAGNOSIS — K592 Neurogenic bowel, not elsewhere classified: Secondary | ICD-10-CM | POA: Diagnosis present

## 2019-08-10 DIAGNOSIS — T43211A Poisoning by selective serotonin and norepinephrine reuptake inhibitors, accidental (unintentional), initial encounter: Secondary | ICD-10-CM | POA: Diagnosis present

## 2019-08-10 DIAGNOSIS — N179 Acute kidney failure, unspecified: Secondary | ICD-10-CM | POA: Diagnosis present

## 2019-08-10 DIAGNOSIS — E869 Volume depletion, unspecified: Secondary | ICD-10-CM | POA: Diagnosis present

## 2019-08-10 DIAGNOSIS — L89893 Pressure ulcer of other site, stage 3: Secondary | ICD-10-CM | POA: Diagnosis present

## 2019-08-10 DIAGNOSIS — N319 Neuromuscular dysfunction of bladder, unspecified: Secondary | ICD-10-CM | POA: Diagnosis not present

## 2019-08-10 DIAGNOSIS — T402X4A Poisoning by other opioids, undetermined, initial encounter: Secondary | ICD-10-CM | POA: Diagnosis not present

## 2019-08-10 DIAGNOSIS — G92 Toxic encephalopathy: Secondary | ICD-10-CM | POA: Diagnosis present

## 2019-08-10 DIAGNOSIS — Z79899 Other long term (current) drug therapy: Secondary | ICD-10-CM

## 2019-08-10 DIAGNOSIS — G9341 Metabolic encephalopathy: Secondary | ICD-10-CM | POA: Diagnosis present

## 2019-08-10 DIAGNOSIS — T426X1A Poisoning by other antiepileptic and sedative-hypnotic drugs, accidental (unintentional), initial encounter: Principal | ICD-10-CM | POA: Diagnosis present

## 2019-08-10 DIAGNOSIS — E872 Acidosis: Secondary | ICD-10-CM | POA: Diagnosis present

## 2019-08-10 DIAGNOSIS — G8929 Other chronic pain: Secondary | ICD-10-CM | POA: Diagnosis present

## 2019-08-10 DIAGNOSIS — R531 Weakness: Secondary | ICD-10-CM

## 2019-08-10 DIAGNOSIS — Y846 Urinary catheterization as the cause of abnormal reaction of the patient, or of later complication, without mention of misadventure at the time of the procedure: Secondary | ICD-10-CM | POA: Diagnosis present

## 2019-08-10 DIAGNOSIS — Z960 Presence of urogenital implants: Secondary | ICD-10-CM | POA: Diagnosis not present

## 2019-08-10 DIAGNOSIS — N3001 Acute cystitis with hematuria: Secondary | ICD-10-CM | POA: Diagnosis not present

## 2019-08-10 DIAGNOSIS — I959 Hypotension, unspecified: Secondary | ICD-10-CM | POA: Diagnosis not present

## 2019-08-10 DIAGNOSIS — T402X1A Poisoning by other opioids, accidental (unintentional), initial encounter: Secondary | ICD-10-CM | POA: Diagnosis present

## 2019-08-10 DIAGNOSIS — B952 Enterococcus as the cause of diseases classified elsewhere: Secondary | ICD-10-CM | POA: Diagnosis present

## 2019-08-10 DIAGNOSIS — L98492 Non-pressure chronic ulcer of skin of other sites with fat layer exposed: Secondary | ICD-10-CM | POA: Diagnosis not present

## 2019-08-10 DIAGNOSIS — G8220 Paraplegia, unspecified: Secondary | ICD-10-CM | POA: Diagnosis not present

## 2019-08-10 DIAGNOSIS — T83518A Infection and inflammatory reaction due to other urinary catheter, initial encounter: Secondary | ICD-10-CM | POA: Diagnosis present

## 2019-08-10 DIAGNOSIS — Z87891 Personal history of nicotine dependence: Secondary | ICD-10-CM

## 2019-08-10 DIAGNOSIS — R400 Somnolence: Secondary | ICD-10-CM | POA: Diagnosis not present

## 2019-08-10 DIAGNOSIS — Z20828 Contact with and (suspected) exposure to other viral communicable diseases: Secondary | ICD-10-CM | POA: Diagnosis present

## 2019-08-10 DIAGNOSIS — Z781 Physical restraint status: Secondary | ICD-10-CM | POA: Diagnosis not present

## 2019-08-10 DIAGNOSIS — L98499 Non-pressure chronic ulcer of skin of other sites with unspecified severity: Secondary | ICD-10-CM | POA: Diagnosis present

## 2019-08-10 DIAGNOSIS — Z978 Presence of other specified devices: Secondary | ICD-10-CM

## 2019-08-10 LAB — COMPREHENSIVE METABOLIC PANEL
ALT: 16 U/L (ref 0–44)
AST: 18 U/L (ref 15–41)
Albumin: 3.8 g/dL (ref 3.5–5.0)
Alkaline Phosphatase: 122 U/L (ref 38–126)
Anion gap: 15 (ref 5–15)
BUN: 40 mg/dL — ABNORMAL HIGH (ref 6–20)
CO2: 21 mmol/L — ABNORMAL LOW (ref 22–32)
Calcium: 10 mg/dL (ref 8.9–10.3)
Chloride: 104 mmol/L (ref 98–111)
Creatinine, Ser: 2.72 mg/dL — ABNORMAL HIGH (ref 0.61–1.24)
GFR calc Af Amer: 34 mL/min — ABNORMAL LOW (ref >60)
GFR calc non Af Amer: 29 mL/min — ABNORMAL LOW (ref >60)
Glucose, Bld: 113 mg/dL — ABNORMAL HIGH (ref 70–99)
Potassium: 4.4 mmol/L (ref 3.5–5.1)
Sodium: 140 mmol/L (ref 135–145)
Total Bilirubin: 0.6 mg/dL (ref 0.3–1.2)
Total Protein: 8.6 g/dL — ABNORMAL HIGH (ref 6.5–8.1)

## 2019-08-10 LAB — CBC WITH DIFFERENTIAL/PLATELET
Abs Immature Granulocytes: 0.03 10*3/uL (ref 0.00–0.07)
Basophils Absolute: 0.1 10*3/uL (ref 0.0–0.1)
Basophils Relative: 1 %
Eosinophils Absolute: 0.2 10*3/uL (ref 0.0–0.5)
Eosinophils Relative: 2 %
HCT: 40.5 % (ref 39.0–52.0)
Hemoglobin: 12.4 g/dL — ABNORMAL LOW (ref 13.0–17.0)
Immature Granulocytes: 0 %
Lymphocytes Relative: 21 %
Lymphs Abs: 2.2 10*3/uL (ref 0.7–4.0)
MCH: 25.3 pg — ABNORMAL LOW (ref 26.0–34.0)
MCHC: 30.6 g/dL (ref 30.0–36.0)
MCV: 82.7 fL (ref 80.0–100.0)
Monocytes Absolute: 0.9 10*3/uL (ref 0.1–1.0)
Monocytes Relative: 8 %
Neutro Abs: 7.2 10*3/uL (ref 1.7–7.7)
Neutrophils Relative %: 68 %
Platelets: 370 10*3/uL (ref 150–400)
RBC: 4.9 MIL/uL (ref 4.22–5.81)
RDW: 14 % (ref 11.5–15.5)
WBC: 10.5 10*3/uL (ref 4.0–10.5)
nRBC: 0 % (ref 0.0–0.2)

## 2019-08-10 LAB — URINALYSIS, ROUTINE W REFLEX MICROSCOPIC
Bilirubin Urine: NEGATIVE
Glucose, UA: NEGATIVE mg/dL
Ketones, ur: 5 mg/dL — AB
Nitrite: NEGATIVE
Protein, ur: >=300 mg/dL — AB
RBC / HPF: >50 RBC/hpf — ABNORMAL HIGH (ref 0–5)
Specific Gravity, Urine: 1.026 (ref 1.005–1.030)
WBC, UA: >50 WBC/hpf — ABNORMAL HIGH (ref 0–5)
pH: 5 (ref 5.0–8.0)

## 2019-08-10 LAB — RAPID URINE DRUG SCREEN, HOSP PERFORMED
Amphetamines: NOT DETECTED
Barbiturates: NOT DETECTED
Benzodiazepines: NOT DETECTED
Cocaine: NOT DETECTED
Opiates: POSITIVE — AB
Tetrahydrocannabinol: NOT DETECTED

## 2019-08-10 LAB — SARS CORONAVIRUS 2 BY RT PCR (HOSPITAL ORDER, PERFORMED IN ~~LOC~~ HOSPITAL LAB): SARS Coronavirus 2: NEGATIVE

## 2019-08-10 LAB — ETHANOL: Alcohol, Ethyl (B): <10 mg/dL (ref <10)

## 2019-08-10 MED ORDER — CHLORHEXIDINE GLUCONATE CLOTH 2 % EX PADS
6.0000 | MEDICATED_PAD | Freq: Every day | CUTANEOUS | Status: DC
  Administered 2019-08-11 – 2019-08-12 (×2): 6 via TOPICAL

## 2019-08-10 MED ORDER — ENOXAPARIN SODIUM 40 MG/0.4ML ~~LOC~~ SOLN
40.0000 mg | Freq: Every day | SUBCUTANEOUS | Status: DC
  Administered 2019-08-11 – 2019-08-13 (×4): 40 mg via SUBCUTANEOUS
  Filled 2019-08-10 (×4): qty 0.4

## 2019-08-10 MED ORDER — NALOXONE HCL 0.4 MG/ML IJ SOLN
INTRAMUSCULAR | Status: AC
  Administered 2019-08-11: 0.4 mg via INTRAVENOUS
  Filled 2019-08-10: qty 1

## 2019-08-10 MED ORDER — ORAL CARE MOUTH RINSE
15.0000 mL | Freq: Two times a day (BID) | OROMUCOSAL | Status: DC
  Administered 2019-08-10 – 2019-08-14 (×5): 15 mL via OROMUCOSAL

## 2019-08-10 MED ORDER — CHLORHEXIDINE GLUCONATE CLOTH 2 % EX PADS: 6.0000 | MEDICATED_PAD | Freq: Every day | CUTANEOUS | Status: DC

## 2019-08-10 MED ORDER — NALOXONE HCL 2 MG/2ML IJ SOSY
PREFILLED_SYRINGE | INTRAMUSCULAR | Status: AC
  Administered 2019-08-10: 20:00:00 2 mg
  Filled 2019-08-10: qty 4

## 2019-08-10 MED ORDER — SODIUM CHLORIDE 0.9 % IV SOLN
2.0000 g | Freq: Once | INTRAVENOUS | Status: AC
  Administered 2019-08-10: 21:00:00 2 g via INTRAVENOUS
  Filled 2019-08-10: qty 2

## 2019-08-10 MED ORDER — NALOXONE HCL 0.4 MG/ML IJ SOLN
0.4000 mg | INTRAMUSCULAR | Status: DC | PRN
  Administered 2019-08-11 (×3): 0.4 mg via INTRAVENOUS
  Filled 2019-08-10 (×4): qty 1

## 2019-08-10 MED ORDER — HALOPERIDOL LACTATE 5 MG/ML IJ SOLN
5.0000 mg | Freq: Once | INTRAMUSCULAR | Status: AC
  Administered 2019-08-10: 20:00:00 5 mg via INTRAVENOUS
  Filled 2019-08-10: qty 1

## 2019-08-10 MED ORDER — LORAZEPAM 2 MG/ML IJ SOLN
INTRAMUSCULAR | Status: AC
  Administered 2019-08-10: 20:00:00 2 mg
  Filled 2019-08-10: qty 1

## 2019-08-10 MED FILL — Medication: Qty: 1 | Status: AC

## 2019-08-10 NOTE — ED Notes (Signed)
Pt not responding to MD and RN. Dr. Langston Masker bedside

## 2019-08-10 NOTE — ED Notes (Signed)
Hard Restraints applied, verbal order per Dr. Langston Masker.

## 2019-08-10 NOTE — ED Notes (Signed)
Report given to Rebecca, RN

## 2019-08-10 NOTE — ED Triage Notes (Signed)
Pt BIB EMS from home. Pt c/o generalized weakness and possible fever. Pt paraplegic.

## 2019-08-10 NOTE — ED Notes (Signed)
5mg  Haldol given per Dr. Langston Masker. See rhythm change. 12 Lead EKG obtained and viewed by Dr. Alvino Chapel and Dr. Langston Masker.
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
## 2019-08-10 NOTE — ED Notes (Signed)
Verbal order by Langston Masker, MD to mix and administer 45mL of Epinephrine with 57mL of NS IVP. Administered by Karna Christmas, RN

## 2019-08-10 NOTE — ED Notes (Signed)
Pt nonverbal with staff. VSS. Dr. Langston Masker bedside.
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
## 2019-08-10 NOTE — Progress Notes (Signed)
A consult was received from an ED physician for Cefepime per pharmacy dosing.  The patient's profile has been reviewed for ht/wt/allergies/indication/available labs.    A one time order has already been placed by MD for Cefepime 2g IV.  Further antibiotics/pharmacy consults should be ordered by admitting physician if indicated.                       Thank you, Luiz Ochoa 08/10/2019  8:10 PM

## 2019-08-11 DIAGNOSIS — N3001 Acute cystitis with hematuria: Secondary | ICD-10-CM | POA: Diagnosis present

## 2019-08-11 DIAGNOSIS — G9341 Metabolic encephalopathy: Secondary | ICD-10-CM

## 2019-08-11 DIAGNOSIS — T402X1A Poisoning by other opioids, accidental (unintentional), initial encounter: Secondary | ICD-10-CM | POA: Diagnosis present

## 2019-08-11 DIAGNOSIS — N179 Acute kidney failure, unspecified: Secondary | ICD-10-CM

## 2019-08-11 DIAGNOSIS — Z960 Presence of urogenital implants: Secondary | ICD-10-CM

## 2019-08-11 LAB — BLOOD GAS, ARTERIAL
Acid-base deficit: 0.6 mmol/L (ref 0.0–2.0)
Bicarbonate: 24.3 mmol/L (ref 20.0–28.0)
Drawn by: 11249
FIO2: 21
O2 Saturation: 95.5 %
Patient temperature: 99.1
pCO2 arterial: 43.9 mmHg (ref 32.0–48.0)
pH, Arterial: 7.363 (ref 7.350–7.450)
pO2, Arterial: 82.5 mmHg — ABNORMAL LOW (ref 83.0–108.0)

## 2019-08-11 LAB — HIV ANTIBODY (ROUTINE TESTING W REFLEX): HIV Screen 4th Generation wRfx: NONREACTIVE

## 2019-08-11 LAB — MRSA PCR SCREENING: MRSA by PCR: NEGATIVE

## 2019-08-11 MED ORDER — FLUMAZENIL 0.5 MG/5ML IV SOLN
INTRAVENOUS | Status: AC
  Filled 2019-08-11: qty 5

## 2019-08-11 MED ORDER — OXYCODONE-ACETAMINOPHEN 5-325 MG PO TABS: 1.0000 | ORAL_TABLET | Freq: Four times a day (QID) | ORAL | Status: DC | PRN

## 2019-08-11 MED ORDER — PREGABALIN 100 MG PO CAPS
300.0000 mg | ORAL_CAPSULE | Freq: Once | ORAL | Status: AC
  Administered 2019-08-11: 300 mg via ORAL
  Filled 2019-08-11: qty 3

## 2019-08-11 MED ORDER — SODIUM CHLORIDE 0.9 % IV BOLUS
1000.0000 mL | Freq: Once | INTRAVENOUS | Status: AC
  Administered 2019-08-11: 13:00:00 1000 mL via INTRAVENOUS

## 2019-08-11 MED ORDER — NALOXONE HCL 2 MG/2ML IJ SOSY
0.8000 mg | PREFILLED_SYRINGE | Freq: Once | INTRAMUSCULAR | Status: AC
  Administered 2019-08-11: 01:00:00 0.8 mg via INTRAVENOUS
  Filled 2019-08-11: qty 2

## 2019-08-11 MED ORDER — FLUMAZENIL 0.5 MG/5ML IV SOLN: 0.3000 mg | Freq: Once | INTRAVENOUS | Status: DC

## 2019-08-11 MED ORDER — SODIUM CHLORIDE 0.9 % IV SOLN
INTRAVENOUS | Status: DC
  Administered 2019-08-11 – 2019-08-13 (×5): via INTRAVENOUS

## 2019-08-11 MED ORDER — EPINEPHRINE 1 MG/10ML IJ SOSY
PREFILLED_SYRINGE | INTRAMUSCULAR | Status: AC
  Filled 2019-08-11: qty 10

## 2019-08-11 MED ORDER — SODIUM CHLORIDE 0.9 % IV SOLN
2.0000 g | Freq: Two times a day (BID) | INTRAVENOUS | Status: DC
  Administered 2019-08-11 – 2019-08-13 (×5): 2 g via INTRAVENOUS
  Filled 2019-08-11 (×9): qty 2

## 2019-08-11 NOTE — Plan of Care (Signed)

## 2019-08-11 NOTE — Progress Notes (Signed)
RR<10, BP MAP 57, O2 88%.  Gave prn Narcan.

## 2019-08-11 NOTE — Progress Notes (Addendum)
Patient was requesting PTA medications. Paged K. Schorr. Raliegh Ip Schorr ordered percocet PRN only.   Explained to patient why K. Schorr ordered percocet only. Patient stated that he would rather take lyrica tonight for his nerve pain instead of the percocet. Paged K. Schorr.   K. Schorr d/c'd percocet order and placed one time order 300mg  lyrica PO.

## 2019-08-11 NOTE — Progress Notes (Signed)
Pharmacy Antibiotic Note  Sean Horton is a 33 y.o. male admitted on 08/10/2019 with UTI.  Pharmacy has been consulted for Cefepime dosing.  Plan: Cefepime 2gm iv q12hr  Weight: 230 lb 2.6 oz (104.4 kg)  Temp (24hrs), Avg:97.9 F (36.6 C), Min:97.6 F (36.4 C), Max:98.1 F (36.7 C)  Recent Labs  Lab 08/10/19 1548  WBC 10.5  CREATININE 2.72*    Estimated Creatinine Clearance: 48.2 mL/min (A) (by C-G formula based on SCr of 2.72 mg/dL (H)).    No Known Allergies  Antimicrobials this admission: Cefepime 08/11/2019 >>   Dose adjustments this admission: -  Microbiology results: -  Thank you for allowing pharmacy to be a part of this patient's care.  Sean Horton 08/11/2019 3:12 AM

## 2019-08-11 NOTE — Plan of Care (Signed)
  Problem: Health Behavior/Discharge Planning: Goal: Ability to manage health-related needs will improve Outcome: Progressing   Problem: Clinical Measurements: Goal: Ability to maintain clinical measurements within normal limits will improve Outcome: Progressing Goal: Respiratory complications will improve Outcome: Progressing

## 2019-08-11 NOTE — Progress Notes (Signed)
Pt able to follow commands appropriately, A&OX4, and understands necessity for safety.  Restraints removed at this time.
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
## 2019-08-11 NOTE — Progress Notes (Signed)
Patient arrived from ED in restraints. When he was transferred from the stretcher to the bed patient did not arouse. Upon assessment patient was unresponsive to sternal rub and was hypotensive. Keturah Shavers was called and came to bedside to assess patient. New orders were given, Narcan was administered. Patient then became hypertensive. ABG was drawn and results were available to [facility] while still at the bedside. Patient will open his eyes when suctioned but still is unable to follow any commands.

## 2019-08-12 LAB — BASIC METABOLIC PANEL
Anion gap: 10 (ref 5–15)
BUN: 40 mg/dL — ABNORMAL HIGH (ref 6–20)
CO2: 21 mmol/L — ABNORMAL LOW (ref 22–32)
Calcium: 8.9 mg/dL (ref 8.9–10.3)
Chloride: 109 mmol/L (ref 98–111)
Creatinine, Ser: 1.31 mg/dL — ABNORMAL HIGH (ref 0.61–1.24)
GFR calc Af Amer: >60 mL/min (ref >60)
GFR calc non Af Amer: >60 mL/min (ref >60)
Glucose, Bld: 191 mg/dL — ABNORMAL HIGH (ref 70–99)
Potassium: 4.2 mmol/L (ref 3.5–5.1)
Sodium: 140 mmol/L (ref 135–145)

## 2019-08-12 LAB — CBC
HCT: 34.2 % — ABNORMAL LOW (ref 39.0–52.0)
Hemoglobin: 10.4 g/dL — ABNORMAL LOW (ref 13.0–17.0)
MCH: 25.6 pg — ABNORMAL LOW (ref 26.0–34.0)
MCHC: 30.4 g/dL (ref 30.0–36.0)
MCV: 84.2 fL (ref 80.0–100.0)
Platelets: 305 10*3/uL (ref 150–400)
RBC: 4.06 MIL/uL — ABNORMAL LOW (ref 4.22–5.81)
RDW: 14.1 % (ref 11.5–15.5)
WBC: 8.5 10*3/uL (ref 4.0–10.5)
nRBC: 0 % (ref 0.0–0.2)

## 2019-08-12 MED ORDER — BISACODYL 5 MG PO TBEC: 5.0000 mg | DELAYED_RELEASE_TABLET | Freq: Every day | ORAL | Status: DC | PRN

## 2019-08-12 MED ORDER — OXYCODONE ER 36 MG PO C12A: 36.0000 mg | EXTENDED_RELEASE_CAPSULE | Freq: Two times a day (BID) | ORAL | Status: DC

## 2019-08-12 MED ORDER — BOOST PLUS PO LIQD
237.0000 mL | Freq: Three times a day (TID) | ORAL | Status: DC
  Administered 2019-08-12 – 2019-08-14 (×6): 237 mL via ORAL
  Filled 2019-08-12 (×10): qty 237

## 2019-08-12 MED ORDER — BACLOFEN 20 MG PO TABS
20.0000 mg | ORAL_TABLET | Freq: Two times a day (BID) | ORAL | Status: DC
  Administered 2019-08-12 (×2): 20 mg via ORAL
  Filled 2019-08-12: qty 1
  Filled 2019-08-12: qty 2

## 2019-08-12 MED ORDER — FERROUS SULFATE 325 (65 FE) MG PO TABS
325.0000 mg | ORAL_TABLET | Freq: Every day | ORAL | Status: DC
  Administered 2019-08-12 – 2019-08-14 (×3): 325 mg via ORAL
  Filled 2019-08-12 (×3): qty 1

## 2019-08-12 MED ORDER — HYDROMORPHONE HCL 1 MG/ML IJ SOLN
1.0000 mg | Freq: Once | INTRAMUSCULAR | Status: AC
  Administered 2019-08-12: 13:00:00 1 mg via INTRAVENOUS
  Filled 2019-08-12: qty 1

## 2019-08-12 MED ORDER — OXYCODONE HCL ER 20 MG PO T12A
30.0000 mg | EXTENDED_RELEASE_TABLET | Freq: Two times a day (BID) | ORAL | Status: DC
  Administered 2019-08-12 – 2019-08-14 (×5): 30 mg via ORAL
  Filled 2019-08-12 (×5): qty 1

## 2019-08-12 MED ORDER — PANTOPRAZOLE SODIUM 40 MG PO TBEC
40.0000 mg | DELAYED_RELEASE_TABLET | Freq: Every day | ORAL | Status: DC
  Administered 2019-08-12 – 2019-08-14 (×3): 40 mg via ORAL
  Filled 2019-08-12 (×3): qty 1

## 2019-08-12 MED ORDER — SUMATRIPTAN SUCCINATE 25 MG PO TABS
25.0000 mg | ORAL_TABLET | ORAL | Status: DC | PRN
  Filled 2019-08-12: qty 1

## 2019-08-12 MED ORDER — OXYCODONE-ACETAMINOPHEN 10-325 MG PO TABS: 1.0000 | ORAL_TABLET | Freq: Three times a day (TID) | ORAL | Status: DC | PRN

## 2019-08-12 MED ORDER — OXYCODONE HCL 5 MG PO TABS
5.0000 mg | ORAL_TABLET | Freq: Three times a day (TID) | ORAL | Status: DC | PRN
  Administered 2019-08-12 – 2019-08-13 (×2): 5 mg via ORAL
  Filled 2019-08-12 (×2): qty 1

## 2019-08-12 MED ORDER — OXYCODONE-ACETAMINOPHEN 5-325 MG PO TABS
1.0000 | ORAL_TABLET | Freq: Three times a day (TID) | ORAL | Status: DC | PRN
  Administered 2019-08-12 – 2019-08-13 (×2): 1 via ORAL
  Filled 2019-08-12 (×2): qty 1

## 2019-08-12 MED ORDER — BOOST PO LIQD: 237.0000 mL | Freq: Three times a day (TID) | ORAL | Status: DC

## 2019-08-12 MED ORDER — FLUTICASONE PROPIONATE 50 MCG/ACT NA SUSP
1.0000 | Freq: Every day | NASAL | Status: DC | PRN
  Administered 2019-08-14: 1 via NASAL
  Filled 2019-08-12: qty 16

## 2019-08-12 MED ORDER — DULOXETINE HCL 60 MG PO CPEP
60.0000 mg | ORAL_CAPSULE | Freq: Two times a day (BID) | ORAL | Status: DC
  Administered 2019-08-12 – 2019-08-14 (×5): 60 mg via ORAL
  Filled 2019-08-12 (×3): qty 1
  Filled 2019-08-12: qty 2
  Filled 2019-08-12: qty 1

## 2019-08-12 MED ORDER — PREGABALIN 75 MG PO CAPS
300.0000 mg | ORAL_CAPSULE | Freq: Two times a day (BID) | ORAL | Status: DC
  Administered 2019-08-12 – 2019-08-14 (×5): 300 mg via ORAL
  Filled 2019-08-12 (×2): qty 4
  Filled 2019-08-12: qty 3
  Filled 2019-08-12 (×2): qty 4

## 2019-08-12 NOTE — Progress Notes (Signed)
Pt c/o of nerve pain in lower legs, medicated with meds as scheduled. States unchanged relief from meds. SRP,RN

## 2019-08-12 NOTE — TOC Initial Note (Signed)
Transition of Care [facility]) - Initial/Assessment Note    Patient Details  Name: Sean Horton MRN: [id number] Date of Birth: 15-May-1986  [facility] CM/SW Contact:    Lynnell Catalan, RN Phone Number: 08/12/2019, 11:05 AM  Clinical Narrative:                   Expected Discharge Plan: Home/Self Care Barriers to Discharge: Continued Medical Work up  Expected Discharge Plan and Services Expected Discharge Plan: Home/Self Care         Expected Discharge Date: (unknown)                                 Activities of Daily Living Home Assistive Devices/Equipment: Wheelchair, Tub transfer bench, Other (Comment)(foley catheter) ADL Screening (condition at time of admission) Patient's cognitive ability adequate to safely complete daily activities?: No(paitent very lethargic) Is the patient deaf or have difficulty hearing?: No Does the patient have difficulty seeing, even when wearing glasses/contacts?: No Does the patient have difficulty concentrating, remembering, or making decisions?: Yes Patient able to express need for assistance with ADLs?: Yes Does the patient have difficulty dressing or bathing?: No Independently performs ADLs?: No Communication: Independent Dressing (OT): Independent Grooming: Independent Feeding: Independent Bathing: Independent Toileting: Independent with device (comment) In/Out Bed: Independent with device (comment) Walks in Home: Dependent Is this a change from baseline?: Pre-admission baseline Does the patient have difficulty walking or climbing stairs?: Yes Weakness of Legs: Both Weakness of Arms/Hands: None   Admission diagnosis:  Weakness [R53.1] Acute cystitis with hematuria [N30.01] Opioid overdose, undetermined intent, initial encounter [facility]) [T40.2X4A] Patient Active Problem List   Diagnosis Date Noted  . Opioid overdose ([place])   . Acute cystitis with hematuria   . Paresthesia 06/17/2019  . Acute metabolic  encephalopathy [id number]  . Rhabdomyolysis 06/05/2019  . Hyperkalemia 06/05/2019  . Hemarthrosis of left knee 06/05/2019  . Complicated UTI (urinary tract infection) 05/18/2019  . AKI (acute kidney injury) ([place])   . Pressure injury of skin 03/05/2019  . Altered mental status   . Pneumothorax   . Pneumomediastinum ([place])   . Hematuria 11/18/2018  . Anemia in other chronic diseases classified elsewhere 11/18/2018  . Sepsis ([place]) 11/17/2018  . Osteomyelitis of pelvic region ([place])   . Osteomyelitis ([place]) 07/19/2018  . Sacral decubitus ulcer 07/19/2018  . Cellulitis of hip, left 07/16/2018  . Absolute anemia   . Wound infection 05/15/2017  . Recurrent UTI 05/09/2017  . Bladder spasm 04/10/2017  . Open upper arm wound 07/21/2016  . Chronic indwelling Foley catheter 07/21/2016  . Chronic pain 07/18/2015  . Non-healing ulcer, multiple sites. 07/18/2015  . Reflex sympathetic dystrophy 10/14/2009  . 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 HYPERTENSION, SYSTOLIC 0000000  . Allergic rhinitis 02/27/2009  . PERIPHERAL EDEMA 02/25/2009  . UNSPECIFIED HYPOTENSION 03/19/2008  . Neurogenic bladder 03/19/2008  . PALPITATIONS, RECURRENT 03/18/2008  . Paraplegia following spinal cord injury ([place]) 10/01/2007  . GERD 10/01/2007  . HEADACHE 10/01/2007   PCP:  [name], MD Pharmacy:   [facility], [place] AT [name] [place] [address] Phone: [phone] Fax: [phone]     Social Determinants of Health (SDOH) Interventions    Readmission Risk Interventions Readmission Risk Prevention Plan 08/12/2019  Transportation Screening Complete  Medication Review [employer]) Complete  PCP or Specialist appointment within 3-5 days of discharge Not Complete  PCP/Specialist Appt Not Complete comments Not ready for dc  HRI or [place] Not Complete  HRI or Home Care Consult Pt Refusal  Comments NA  SW Recovery Care/Counseling Consult Not Complete  SW Consult Not Complete Comments NA  Palliative Care Screening Not [place] Not Applicable  Some recent data might be hidden

## 2019-08-12 NOTE — Progress Notes (Signed)
Received pt from ICU, tele verified, VS noted. Pt questioned pain med. Reviewed MAR with pt. SRP,RN

## 2019-08-12 NOTE — Consult Note (Signed)
[place] Nurse wound consult note Reason for Consult:Chronic nonhealing wounds to left arm, right heel and cervical spine.  All are stage 3 and clean and pink .  Scarring to bilateral scapula from healed pressure injuries.   Albumin is 3.8.  Wounds not resolving and likely an offloading issue.  Patient is not turning and repositioning. Will order mattress with low air loss feature. No disposable underpads or briefs. Keep skin in contact with therapeutic linen for moisture wicking and antimicrobial properties to improve skin microclimate.  Wound type:Pressure Pressure Injury POA: Yes Measurement: Scarring to scapula measures 5 cm x 6 cm intact scarring with no return of melanin. Cervical spine: 2 cm x 2 cm x0.3 cm Left arm is chronic nonhealing surgical wound that is epithelializing  Right heel:  2 cm x 1 cm x 0.2 cm  Deep scarring with contracture noted to left hip from healed full thickness wound.  Intact  Trauma to meatus from chronic indwelling foley.  Wound [id number] and moist Drainage (amount, consistency, odor) minimal serosanguinous Periwound:intact  New epithelium  Dressing procedure/placement/frequency: Cleanse wounds to back, right heel, left arm with NS and pat dry.  Apply Xeroform gauze to wound bed.  Cover with silicone foam to pad and protect.  Change every three days.  No disposable briefs or underpads.  Turn and reposition every two hours.  Mattress with low air loss feature.    Will not follow at this time.  Please re-consult if needed.  Domenic Moras MSN, RN, FNP-BC CWON Wound, Ostomy, Continence Nurse Pager [phone]
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
## 2019-08-13 DIAGNOSIS — N319 Neuromuscular dysfunction of bladder, unspecified: Secondary | ICD-10-CM

## 2019-08-13 DIAGNOSIS — G8220 Paraplegia, unspecified: Secondary | ICD-10-CM

## 2019-08-13 DIAGNOSIS — N390 Urinary tract infection, site not specified: Secondary | ICD-10-CM

## 2019-08-13 LAB — CBC
HCT: 33.2 % — ABNORMAL LOW (ref 39.0–52.0)
Hemoglobin: 9.9 g/dL — ABNORMAL LOW (ref 13.0–17.0)
MCH: 25.4 pg — ABNORMAL LOW (ref 26.0–34.0)
MCHC: 29.8 g/dL — ABNORMAL LOW (ref 30.0–36.0)
MCV: 85.1 fL (ref 80.0–100.0)
Platelets: 268 10*3/uL (ref 150–400)
RBC: 3.9 MIL/uL — ABNORMAL LOW (ref 4.22–5.81)
RDW: 14.3 % (ref 11.5–15.5)
WBC: 7.9 10*3/uL (ref 4.0–10.5)
nRBC: 0 % (ref 0.0–0.2)

## 2019-08-13 LAB — BASIC METABOLIC PANEL
Anion gap: 6 (ref 5–15)
BUN: 30 mg/dL — ABNORMAL HIGH (ref 6–20)
CO2: 21 mmol/L — ABNORMAL LOW (ref 22–32)
Calcium: 9.2 mg/dL (ref 8.9–10.3)
Chloride: 110 mmol/L (ref 98–111)
Creatinine, Ser: 1.13 mg/dL (ref 0.61–1.24)
GFR calc Af Amer: >60 mL/min (ref >60)
GFR calc non Af Amer: >60 mL/min (ref >60)
Glucose, Bld: 182 mg/dL — ABNORMAL HIGH (ref 70–99)
Potassium: 4 mmol/L (ref 3.5–5.1)
Sodium: 137 mmol/L (ref 135–145)

## 2019-08-13 LAB — URINE CULTURE
Culture: 30000 — AB
Special Requests: NORMAL

## 2019-08-13 MED ORDER — HYDRALAZINE HCL 20 MG/ML IJ SOLN
10.0000 mg | Freq: Once | INTRAMUSCULAR | Status: AC
  Administered 2019-08-13: 22:00:00 10 mg via INTRAVENOUS
  Filled 2019-08-13: qty 1

## 2019-08-13 MED ORDER — HYDROMORPHONE HCL 2 MG PO TABS
2.0000 mg | ORAL_TABLET | ORAL | Status: DC | PRN
  Administered 2019-08-13 – 2019-08-14 (×6): 2 mg via ORAL
  Filled 2019-08-13 (×6): qty 1

## 2019-08-13 MED ORDER — KETOROLAC TROMETHAMINE 30 MG/ML IJ SOLN
30.0000 mg | Freq: Four times a day (QID) | INTRAMUSCULAR | Status: DC | PRN
  Administered 2019-08-13 – 2019-08-14 (×4): 30 mg via INTRAVENOUS
  Filled 2019-08-13 (×4): qty 1

## 2019-08-13 MED ORDER — AMOXICILLIN-POT CLAVULANATE 875-125 MG PO TABS
1.0000 | ORAL_TABLET | Freq: Two times a day (BID) | ORAL | Status: DC
  Administered 2019-08-13 – 2019-08-14 (×2): 1 via ORAL
  Filled 2019-08-13 (×3): qty 1

## 2019-08-13 MED ORDER — HYDROCORTISONE 1 % EX CREA
1.0000 "application " | TOPICAL_CREAM | Freq: Three times a day (TID) | CUTANEOUS | Status: DC | PRN
  Administered 2019-08-13: 1 via TOPICAL
  Filled 2019-08-13: qty 28

## 2019-08-13 MED ORDER — BACLOFEN 20 MG PO TABS
20.0000 mg | ORAL_TABLET | Freq: Three times a day (TID) | ORAL | Status: DC
  Administered 2019-08-13 – 2019-08-14 (×4): 20 mg via ORAL
  Filled 2019-08-13 (×4): qty 1

## 2019-08-13 NOTE — Progress Notes (Signed)
Pt c/o of nerve pain 8/10, states pain is constant. MD made aware. SRP, RN

## 2019-08-13 NOTE — Progress Notes (Signed)
Pt  c/o of pain 7-8/10 ALL Day. Pt was administered varies pain control options and he states no relief or meds were not effective in relieving his pain. Pt has flat affect, VS noted. Pt calm and request Dilaudid IV. MD made aware and discussed pt pain management option with pt and nurse. Pt has been advised why he can not  have IV pain medication. Pt in denial stating "I had a bad infection and that is the reason I was admitted to the hospital not due to taking too much pain med". Pt return to flat demeanor and continues to request IV pain medication. Pt appear flustered. Pt education completed. SRP,RN

## 2019-08-13 NOTE — Progress Notes (Signed)
PT Cancellation Note / Screen  Patient Details Name: Sean Horton MRN: [id number] DOB: 1986-01-25   Cancelled Treatment:    Reason Eval/Treat Not Completed: PT screened, no needs identified, will sign off Pt reports pain limiting mobility at this time however he states that is typical for a hospital stay for him.  Once pain improves, he is able to move at baseline per his report.  He declines need for PT at this time however agreeable to request consult if he does have difficulty with mobility during acute stay.  PT to sign off.   Hersey Maclellan,KATHrine E 08/13/2019, 11:57 AM Carmelia Bake, PT, DPT Acute Rehabilitation Services Office: [phone] Pager: [phone]
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
## 2019-08-14 DIAGNOSIS — L98492 Non-pressure chronic ulcer of skin of other sites with fat layer exposed: Secondary | ICD-10-CM

## 2019-08-14 LAB — CBC
HCT: 33.8 % — ABNORMAL LOW (ref 39.0–52.0)
Hemoglobin: 10.3 g/dL — ABNORMAL LOW (ref 13.0–17.0)
MCH: 25.2 pg — ABNORMAL LOW (ref 26.0–34.0)
MCHC: 30.5 g/dL (ref 30.0–36.0)
MCV: 82.8 fL (ref 80.0–100.0)
Platelets: 278 10*3/uL (ref 150–400)
RBC: 4.08 MIL/uL — ABNORMAL LOW (ref 4.22–5.81)
RDW: 13.7 % (ref 11.5–15.5)
WBC: 6.7 10*3/uL (ref 4.0–10.5)
nRBC: 0 % (ref 0.0–0.2)

## 2019-08-14 LAB — BASIC METABOLIC PANEL
Anion gap: 6 (ref 5–15)
BUN: 31 mg/dL — ABNORMAL HIGH (ref 6–20)
CO2: 22 mmol/L (ref 22–32)
Calcium: 9.3 mg/dL (ref 8.9–10.3)
Chloride: 111 mmol/L (ref 98–111)
Creatinine, Ser: 1.04 mg/dL (ref 0.61–1.24)
GFR calc Af Amer: >60 mL/min (ref >60)
GFR calc non Af Amer: >60 mL/min (ref >60)
Glucose, Bld: 156 mg/dL — ABNORMAL HIGH (ref 70–99)
Potassium: 4.4 mmol/L (ref 3.5–5.1)
Sodium: 139 mmol/L (ref 135–145)

## 2019-08-14 MED ORDER — AMOXICILLIN-POT CLAVULANATE 875-125 MG PO TABS: 1.0000 | ORAL_TABLET | Freq: Two times a day (BID) | ORAL | 0 refills | Status: DC

## 2019-08-14 MED ORDER — HYDROMORPHONE HCL 1 MG/ML IJ SOLN
2.0000 mg | Freq: Once | INTRAMUSCULAR | Status: AC
  Administered 2019-08-14: 2 mg via INTRAVENOUS
  Filled 2019-08-14: qty 2

## 2019-08-14 NOTE — TOC Initial Note (Signed)
Transition of Care [facility]) - Initial/Assessment Note    Patient Details  Name: Sean Horton MRN: [id number] Date of Birth: [birth year]  Transition of [facility]) CM/SW Contact:    Purcell Mouton, RN Phone Number: 08/14/2019, 10:54 AM  Clinical Narrative:                 Spoke with pt cocnerning HH needs and transportation. Pt is Paraplegic discharged home with no HH needs. PTAR called.  Expected Discharge Plan: Home/Self Care Barriers to Discharge: Continued Medical Work up   Patient Goals and CMS Choice Patient states their goals for this hospitalization and ongoing recovery are:: To go home      Expected Discharge Plan and Services Expected Discharge Plan: Home/Self Care   Discharge Planning Services: CM Consult   Living arrangements for the past 2 months: Apartment Expected Discharge Date: 08/14/19                                    Prior Living Arrangements/Services Living arrangements for the past 2 months: Apartment Lives with:: Self Patient language and need for interpreter reviewed:: No Do you feel safe going back to the place where you live?: Yes               Activities of Daily Living Home Assistive Devices/Equipment: Wheelchair, Tub transfer bench, Other (Comment)(foley catheter) ADL Screening (condition at time of admission) Patient's cognitive ability adequate to safely complete daily activities?: No(paitent very lethargic) Is the patient deaf or have difficulty hearing?: No Does the patient have difficulty seeing, even when wearing glasses/contacts?: No Does the patient have difficulty concentrating, remembering, or making decisions?: Yes Patient able to express need for assistance with ADLs?: Yes Does the patient have difficulty dressing or bathing?: No Independently performs ADLs?: No Communication: Independent Dressing (OT): Independent Grooming: Independent Feeding: Independent Bathing: Independent Toileting: Independent with  device (comment) In/Out Bed: Independent with device (comment) Walks in Home: Dependent Is this a change from baseline?: Pre-admission baseline Does the patient have difficulty walking or climbing stairs?: Yes Weakness of Legs: Both Weakness of Arms/Hands: None  Permission Sought/Granted Permission sought to share information with : Case Manager                Emotional Assessment Appearance:: Appears stated age     Orientation: : Oriented to Self, Oriented to Place, Oriented to  Time, Oriented to Situation      Admission diagnosis:  Weakness [R53.1] Acute cystitis with hematuria [N30.01] Opioid overdose, undetermined intent, initial encounter [facility]) [T40.2X4A] Patient Active Problem List   Diagnosis Date Noted  . Opioid overdose ([place])   . Acute cystitis with hematuria   . Paresthesia 06/17/2019  . Acute metabolic encephalopathy [id number]  . Rhabdomyolysis 06/05/2019  . Hyperkalemia 06/05/2019  . Hemarthrosis of left knee 06/05/2019  . Complicated UTI (urinary tract infection) 05/18/2019  . AKI (acute kidney injury) ([place])   . Pressure injury of skin 03/05/2019  . Altered mental status   . Pneumothorax   . Pneumomediastinum ([place])   . Hematuria 11/18/2018  . Anemia in other chronic diseases classified elsewhere 11/18/2018  . Sepsis ([place]) 11/17/2018  . Osteomyelitis of pelvic region ([place])   . Osteomyelitis ([place]) 07/19/2018  . Sacral decubitus ulcer 07/19/2018  . Cellulitis of hip, left 07/16/2018  . Absolute anemia   . Wound infection 05/15/2017  . Recurrent UTI 05/09/2017  . Bladder spasm 04/10/2017  .  Open upper arm wound 07/21/2016  . Chronic indwelling Foley catheter 07/21/2016  . Chronic pain 07/18/2015  . Non-healing ulcer, multiple sites. 07/18/2015  . Reflex sympathetic dystrophy 10/14/2009  . 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 HYPERTENSION, SYSTOLIC 0000000  . Allergic rhinitis 02/27/2009  . PERIPHERAL EDEMA 02/25/2009  . UNSPECIFIED  HYPOTENSION 03/19/2008  . Neurogenic bladder 03/19/2008  . PALPITATIONS, RECURRENT 03/18/2008  . Paraplegia following spinal cord injury ([place]) 10/01/2007  . GERD 10/01/2007  . HEADACHE 10/01/2007   PCP:  [name], MD Pharmacy:   [place], [employer] [facility] [place] [address] Phone: [phone] Fax: [phone]     Social Determinants of Health ([place]) Interventions    Readmission Risk Interventions Readmission Risk Prevention Plan 08/12/2019  Transportation Screening Complete  Medication Review Press photographer) Complete  PCP or Specialist appointment within 3-5 days of discharge Not Complete  PCP/Specialist Appt Not Complete comments Not ready for dc  HRI or [place] Not Complete  HRI or Home Care Consult Pt Refusal Comments NA  SW Recovery Care/Counseling Consult Not Complete  SW Consult Not Complete Comments NA  Palliative Care Screening Not [place] Not Applicable  Some recent data might be hidden

## 2019-08-14 NOTE — Progress Notes (Signed)
Pt discharged to home, live alone, discharge instructions reviewed with pt including prescriptions. Pt acknowledged understanding. Teaching and education complete. EMS has been dispatched for arrival of 1pm.  SRP, RN
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
## 2019-08-16 LAB — CULTURE, BLOOD (ROUTINE X 2): Culture: NO GROWTH

## 2019-08-20 ENCOUNTER — Ambulatory Visit (HOSPITAL_COMMUNITY): Admission: RE | Admit: 2019-08-20 | Payer: Medicaid Other | Source: Home / Self Care | Admitting: Anesthesiology

## 2019-08-20 ENCOUNTER — Encounter (HOSPITAL_COMMUNITY): Admission: RE | Payer: Self-pay | Source: Home / Self Care

## 2019-08-20 SURGERY — INTRATHECAL PUMP IMPLANT
Anesthesia: General | Laterality: Left

## 2019-08-22 ENCOUNTER — Other Ambulatory Visit: Payer: Medicaid Other

## 2019-08-25 ENCOUNTER — Telehealth: Payer: Self-pay

## 2019-08-25 ENCOUNTER — Encounter: Payer: Medicaid Other | Admitting: Neurology

## 2019-08-25 NOTE — Telephone Encounter (Signed)
Canceled NCV/EMG same day due to lack of transportation. He will need to be rescheduled.

## 2019-08-25 NOTE — Telephone Encounter (Signed)
Patient called and stated that he needs to r/s his appt due to transportation issues. Call back number is [phone]

## 2019-08-26 NOTE — Telephone Encounter (Signed)
I called patient and LVM requesting he call back to reschedule his NCS/EMG.

## 2019-08-28 ENCOUNTER — Other Ambulatory Visit: Payer: Self-pay | Admitting: Anesthesiology

## 2019-09-10 ENCOUNTER — Telehealth: Payer: Self-pay | Admitting: *Deleted

## 2019-09-10 DIAGNOSIS — G904 Autonomic dysreflexia: Secondary | ICD-10-CM

## 2019-09-10 NOTE — Addendum Note (Signed)
Addended by: [name] D on: 09/10/2019 01:57 PM   Modules accepted: Orders

## 2019-09-10 NOTE — Telephone Encounter (Signed)
Manual wheelchair DME order placed.

## 2019-09-10 NOTE — Addendum Note (Signed)
Addended by: [name] D on: 09/10/2019 02:13 PM   Modules accepted: Orders

## 2019-09-10 NOTE — Telephone Encounter (Signed)
Patient states that he is due for a new manual wheelchair in January.  [facility] advised him to request one now because it can take up to 3 months.   Will forward to PCP to place and I will then message [facility]. Christen Bame, CMA

## 2019-09-10 NOTE — Telephone Encounter (Signed)
Community message sent to [facility].  Rajon Bisig,CMA

## 2019-09-11 ENCOUNTER — Inpatient Hospital Stay: Admission: RE | Admit: 2019-09-11 | Payer: Medicaid Other | Source: Ambulatory Visit

## 2019-09-22 NOTE — Progress Notes (Signed)
[facility] [id number] - [place], [place] - [address] [place] [address] Phone: [phone] Fax: [phone]      Your procedure is scheduled on November 6th.  Report to [facility] Main Entrance "A" at 11:40 A.M., and check in at the Admitting office.  Call this number if you have problems the morning of surgery:  [phone]  Call [phone] if you have any questions prior to your surgery date Monday-Friday 8am-4pm    Remember:  Do not eat or drink after midnight the night before your surgery    Take these medicines the morning of surgery with A SIP OF WATER   Tylenol - if needed  Bisacodyl (Dulcolax)  Duloxetine (cymbalta)  Allegra - if needed  Omeprazole (Prilosec)   Oxycodone-Acetaminophen - if needed    7 days prior to surgery STOP taking any Aspirin (unless otherwise instructed by your surgeon), Aleve, Naproxen, Ibuprofen, Motrin, Advil, Goody's, BC's, all herbal medications, fish oil, and all vitamins.    The Morning of Surgery  Do not wear jewelry.  Do not wear lotions, powders, colognes, or deodorant  Men may shave face and neck.  Do not bring valuables to the hospital.  [facility] is not responsible for any belongings or valuables.  If you are a smoker, DO NOT Smoke 24 hours prior to surgery IF you wear a CPAP at night please bring your mask, tubing, and machine the morning of surgery   Remember that you must have someone to transport you home after your surgery, and remain with you for 24 hours if you are discharged the same day.   Contacts, glasses, hearing aids, dentures or bridgework may not be worn into surgery.    Leave your suitcase in the car.  After surgery it may be brought to your room.  For patients admitted to the hospital, discharge time will be determined by your treatment team.  Patients discharged the day of surgery will not be allowed to drive home.    Special  instructions:   [facility]- Preparing For Surgery  Before surgery, you can play an important role. Because skin is not sterile, your skin needs to be as free of germs as possible. You can reduce the number of germs on your skin by washing with CHG (chlorahexidine gluconate) Soap before surgery.  CHG is an antiseptic cleaner which kills germs and bonds with the skin to continue killing germs even after washing.    Oral Hygiene is also important to reduce your risk of infection.  Remember - BRUSH YOUR TEETH THE MORNING OF SURGERY WITH YOUR REGULAR TOOTHPASTE  Please do not use if you have an allergy to CHG or antibacterial soaps. If your skin becomes reddened/irritated stop using the CHG.  Do not shave (including legs and underarms) for at least 48 hours prior to first CHG shower. It is OK to shave your face.  Please follow these instructions carefully.   1. Shower the NIGHT BEFORE SURGERY and the MORNING OF SURGERY with CHG Soap.   2. If you chose to wash your hair, wash your hair first as usual with your normal shampoo.  3. After you shampoo, rinse your hair and body thoroughly to remove the shampoo.  4. Use CHG as you would any other liquid soap. You can apply CHG directly to the skin and wash gently with a scrungie or a clean washcloth.   5. Apply the  CHG Soap to your body ONLY FROM THE NECK DOWN.  Do not use on open wounds or open sores. Avoid contact with your eyes, ears, mouth and genitals (private parts). Wash Face and genitals (private parts)  with your normal soap.   6. Wash thoroughly, paying special attention to the area where your surgery will be performed.  7. Thoroughly rinse your body with warm water from the neck down.  8. DO NOT shower/wash with your normal soap after using and rinsing off the CHG Soap.  9. Pat yourself dry with a CLEAN TOWEL.  10. Wear CLEAN PAJAMAS to bed the night before surgery, wear comfortable clothes the morning of surgery  11. Place CLEAN  SHEETS on your bed the night of your first shower and DO NOT SLEEP WITH PETS.    Day of Surgery:  Do not apply any deodorants/lotions. Please shower the morning of surgery with the CHG soap  Please wear clean clothes to the hospital/surgery center.   Remember to brush your teeth WITH YOUR REGULAR TOOTHPASTE.   Please read over the following fact sheets that you were given.

## 2019-09-23 ENCOUNTER — Other Ambulatory Visit: Payer: Self-pay

## 2019-09-23 ENCOUNTER — Encounter (HOSPITAL_COMMUNITY)
Admission: RE | Admit: 2019-09-23 | Discharge: 2019-09-23 | Disposition: A | Discharge status: Home / Self Care | Payer: Medicaid Other | Source: Ambulatory Visit | Attending: Anesthesiology | Admitting: Anesthesiology

## 2019-09-23 ENCOUNTER — Encounter (HOSPITAL_COMMUNITY): Payer: Self-pay

## 2019-09-23 ENCOUNTER — Other Ambulatory Visit (HOSPITAL_COMMUNITY): Payer: Medicaid Other

## 2019-09-23 DIAGNOSIS — Z01812 Encounter for preprocedural laboratory examination: Secondary | ICD-10-CM | POA: Diagnosis present

## 2019-09-23 HISTORY — DX: Autonomic dysreflexia: G90.4

## 2019-09-23 LAB — BASIC METABOLIC PANEL
Anion gap: 9 (ref 5–15)
BUN: 31 mg/dL — ABNORMAL HIGH (ref 6–20)
CO2: 24 mmol/L (ref 22–32)
Calcium: 10.2 mg/dL (ref 8.9–10.3)
Chloride: 106 mmol/L (ref 98–111)
Creatinine, Ser: 0.96 mg/dL (ref 0.61–1.24)
GFR calc Af Amer: >60 mL/min (ref >60)
GFR calc non Af Amer: >60 mL/min (ref >60)
Glucose, Bld: 111 mg/dL — ABNORMAL HIGH (ref 70–99)
Potassium: 5 mmol/L (ref 3.5–5.1)
Sodium: 139 mmol/L (ref 135–145)

## 2019-09-23 LAB — CBC
HCT: 39.9 % (ref 39.0–52.0)
Hemoglobin: 11.7 g/dL — ABNORMAL LOW (ref 13.0–17.0)
MCH: 25.5 pg — ABNORMAL LOW (ref 26.0–34.0)
MCHC: 29.3 g/dL — ABNORMAL LOW (ref 30.0–36.0)
MCV: 87.1 fL (ref 80.0–100.0)
Platelets: 354 10*3/uL (ref 150–400)
RBC: 4.58 MIL/uL (ref 4.22–5.81)
RDW: 14.7 % (ref 11.5–15.5)
WBC: 8.1 10*3/uL (ref 4.0–10.5)
nRBC: 0 % (ref 0.0–0.2)

## 2019-09-23 LAB — SURGICAL PCR SCREEN
MRSA, PCR: NEGATIVE
Staphylococcus aureus: POSITIVE — AB

## 2019-09-23 NOTE — Progress Notes (Addendum)
I called a prescription to [facility].  I called patient and informed him of result and that I called it to [facility]; patient asked me to call it to [place] since he is in [place] can pick it up today. I called  [facility] as patient asked.

## 2019-09-30 ENCOUNTER — Other Ambulatory Visit (HOSPITAL_COMMUNITY)
Admission: RE | Admit: 2019-09-30 | Discharge: 2019-09-30 | Disposition: A | Discharge status: Home / Self Care | Payer: Medicaid Other | Source: Ambulatory Visit | Attending: Anesthesiology | Admitting: Anesthesiology

## 2019-09-30 DIAGNOSIS — Z20828 Contact with and (suspected) exposure to other viral communicable diseases: Secondary | ICD-10-CM | POA: Diagnosis not present

## 2019-09-30 DIAGNOSIS — Z01812 Encounter for preprocedural laboratory examination: Secondary | ICD-10-CM | POA: Diagnosis present

## 2019-10-01 LAB — NOVEL CORONAVIRUS, NAA (HOSP ORDER, SEND-OUT TO REF LAB; TAT 18-24 HRS): SARS-CoV-2, NAA: NOT DETECTED
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
## 2019-10-03 ENCOUNTER — Encounter (HOSPITAL_COMMUNITY): Payer: Self-pay | Admitting: Certified Registered Nurse Anesthetist

## 2019-10-03 ENCOUNTER — Encounter (HOSPITAL_COMMUNITY)
Admission: RE | Disposition: A | Discharge status: Home / Self Care | Payer: Self-pay | Source: Home / Self Care | Attending: Anesthesiology

## 2019-10-03 ENCOUNTER — Ambulatory Visit (HOSPITAL_COMMUNITY): Payer: Medicaid Other

## 2019-10-03 ENCOUNTER — Ambulatory Visit (HOSPITAL_COMMUNITY): Payer: Medicaid Other | Admitting: Anesthesiology

## 2019-10-03 ENCOUNTER — Ambulatory Visit (HOSPITAL_COMMUNITY)
Admission: RE | Admit: 2019-10-03 | Discharge: 2019-10-03 | Disposition: A | Discharge status: Home / Self Care | Payer: Medicaid Other | Attending: Anesthesiology | Admitting: Anesthesiology

## 2019-10-03 ENCOUNTER — Other Ambulatory Visit: Payer: Self-pay

## 2019-10-03 DIAGNOSIS — W3400XS Accidental discharge from unspecified firearms or gun, sequela: Secondary | ICD-10-CM | POA: Diagnosis not present

## 2019-10-03 DIAGNOSIS — G8221 Paraplegia, complete: Secondary | ICD-10-CM | POA: Insufficient documentation

## 2019-10-03 DIAGNOSIS — Z7951 Long term (current) use of inhaled steroids: Secondary | ICD-10-CM | POA: Diagnosis not present

## 2019-10-03 DIAGNOSIS — Z8249 Family history of ischemic heart disease and other diseases of the circulatory system: Secondary | ICD-10-CM | POA: Insufficient documentation

## 2019-10-03 DIAGNOSIS — G8220 Paraplegia, unspecified: Secondary | ICD-10-CM | POA: Diagnosis present

## 2019-10-03 DIAGNOSIS — Z803 Family history of malignant neoplasm of breast: Secondary | ICD-10-CM | POA: Diagnosis not present

## 2019-10-03 DIAGNOSIS — I10 Essential (primary) hypertension: Secondary | ICD-10-CM | POA: Diagnosis not present

## 2019-10-03 DIAGNOSIS — S24102S Unspecified injury at T2-T6 level of thoracic spinal cord, sequela: Secondary | ICD-10-CM | POA: Insufficient documentation

## 2019-10-03 DIAGNOSIS — Z823 Family history of stroke: Secondary | ICD-10-CM | POA: Diagnosis not present

## 2019-10-03 DIAGNOSIS — Z8042 Family history of malignant neoplasm of prostate: Secondary | ICD-10-CM | POA: Insufficient documentation

## 2019-10-03 DIAGNOSIS — G43909 Migraine, unspecified, not intractable, without status migrainosus: Secondary | ICD-10-CM | POA: Diagnosis not present

## 2019-10-03 DIAGNOSIS — D649 Anemia, unspecified: Secondary | ICD-10-CM | POA: Insufficient documentation

## 2019-10-03 DIAGNOSIS — Z87891 Personal history of nicotine dependence: Secondary | ICD-10-CM | POA: Insufficient documentation

## 2019-10-03 DIAGNOSIS — K219 Gastro-esophageal reflux disease without esophagitis: Secondary | ICD-10-CM | POA: Insufficient documentation

## 2019-10-03 DIAGNOSIS — N318 Other neuromuscular dysfunction of bladder: Secondary | ICD-10-CM | POA: Insufficient documentation

## 2019-10-03 DIAGNOSIS — Z79899 Other long term (current) drug therapy: Secondary | ICD-10-CM | POA: Insufficient documentation

## 2019-10-03 DIAGNOSIS — R252 Cramp and spasm: Secondary | ICD-10-CM

## 2019-10-03 DIAGNOSIS — Z833 Family history of diabetes mellitus: Secondary | ICD-10-CM | POA: Insufficient documentation

## 2019-10-03 HISTORY — PX: INTRATHECAL PUMP IMPLANT: SHX6809

## 2019-10-03 SURGERY — INTRATHECAL PUMP IMPLANT
Anesthesia: General

## 2019-10-03 MED ORDER — ACETAMINOPHEN 500 MG PO TABS
1000.0000 mg | ORAL_TABLET | Freq: Once | ORAL | Status: AC
  Administered 2019-10-03: 11:00:00 1000 mg via ORAL

## 2019-10-03 MED ORDER — CEFAZOLIN SODIUM-DEXTROSE 2-4 GM/100ML-% IV SOLN
INTRAVENOUS | Status: AC
  Filled 2019-10-03: qty 100

## 2019-10-03 MED ORDER — KETOROLAC TROMETHAMINE 30 MG/ML IJ SOLN
INTRAMUSCULAR | Status: AC
  Filled 2019-10-03: qty 1

## 2019-10-03 MED ORDER — DEXAMETHASONE SODIUM PHOSPHATE 10 MG/ML IJ SOLN
INTRAMUSCULAR | Status: DC | PRN
  Administered 2019-10-03: 5 mg via INTRAVENOUS

## 2019-10-03 MED ORDER — MIDAZOLAM HCL 2 MG/2ML IJ SOLN
INTRAMUSCULAR | Status: AC
  Filled 2019-10-03: qty 2

## 2019-10-03 MED ORDER — LIDOCAINE 2% (20 MG/ML) 5 ML SYRINGE
INTRAMUSCULAR | Status: DC | PRN
  Administered 2019-10-03: 60 mg via INTRAVENOUS

## 2019-10-03 MED ORDER — PHENYLEPHRINE 40 MCG/ML (10ML) SYRINGE FOR IV PUSH (FOR BLOOD PRESSURE SUPPORT)
PREFILLED_SYRINGE | INTRAVENOUS | Status: AC
  Filled 2019-10-03: qty 10

## 2019-10-03 MED ORDER — FENTANYL CITRATE (PF) 100 MCG/2ML IJ SOLN
INTRAMUSCULAR | Status: DC | PRN
  Administered 2019-10-03: 50 ug via INTRAVENOUS
  Administered 2019-10-03: 100 ug via INTRAVENOUS
  Administered 2019-10-03 (×2): 50 ug via INTRAVENOUS

## 2019-10-03 MED ORDER — OXYCODONE-ACETAMINOPHEN 10-325 MG PO TABS: 1.0000 | ORAL_TABLET | ORAL | 0 refills | Status: AC | PRN

## 2019-10-03 MED ORDER — PHENYLEPHRINE HCL (PRESSORS) 10 MG/ML IV SOLN
INTRAVENOUS | Status: DC | PRN
  Administered 2019-10-03: 120 ug via INTRAVENOUS
  Administered 2019-10-03 (×2): 80 ug via INTRAVENOUS

## 2019-10-03 MED ORDER — SUGAMMADEX SODIUM 200 MG/2ML IV SOLN
INTRAVENOUS | Status: DC | PRN
  Administered 2019-10-03: 200 mg via INTRAVENOUS

## 2019-10-03 MED ORDER — DEXAMETHASONE SODIUM PHOSPHATE 10 MG/ML IJ SOLN
INTRAMUSCULAR | Status: AC
  Filled 2019-10-03: qty 1

## 2019-10-03 MED ORDER — LIDOCAINE 2% (20 MG/ML) 5 ML SYRINGE
INTRAMUSCULAR | Status: AC
  Filled 2019-10-03: qty 5

## 2019-10-03 MED ORDER — BUPIVACAINE-EPINEPHRINE 0.5% -1:200000 IJ SOLN
INTRAMUSCULAR | Status: DC | PRN
  Administered 2019-10-03: 27 mL

## 2019-10-03 MED ORDER — OXYCODONE HCL 5 MG/5ML PO SOLN: 5.0000 mg | Freq: Once | ORAL | Status: DC | PRN

## 2019-10-03 MED ORDER — ROCURONIUM BROMIDE 50 MG/5ML IV SOSY
PREFILLED_SYRINGE | INTRAVENOUS | Status: DC | PRN
  Administered 2019-10-03: 60 mg via INTRAVENOUS
  Administered 2019-10-03: 10 mg via INTRAVENOUS

## 2019-10-03 MED ORDER — ROCURONIUM BROMIDE 10 MG/ML (PF) SYRINGE
PREFILLED_SYRINGE | INTRAVENOUS | Status: AC
  Filled 2019-10-03: qty 20

## 2019-10-03 MED ORDER — MIDAZOLAM HCL 5 MG/5ML IJ SOLN
INTRAMUSCULAR | Status: DC | PRN
  Administered 2019-10-03: 2 mg via INTRAVENOUS

## 2019-10-03 MED ORDER — EPHEDRINE 5 MG/ML INJ
INTRAVENOUS | Status: AC
  Filled 2019-10-03: qty 10

## 2019-10-03 MED ORDER — BACITRACIN-NEOMYCIN-POLYMYXIN OINTMENT TUBE
TOPICAL_OINTMENT | CUTANEOUS | Status: AC
  Filled 2019-10-03: qty 14.17

## 2019-10-03 MED ORDER — ACETAMINOPHEN 500 MG PO TABS
ORAL_TABLET | ORAL | Status: AC
  Administered 2019-10-03: 11:00:00 1000 mg via ORAL
  Filled 2019-10-03: qty 2

## 2019-10-03 MED ORDER — BACLOFEN 40 MG/20ML IT SOLN
40.0000 mg | Freq: Once | INTRATHECAL | Status: DC
  Filled 2019-10-03 (×2): qty 20

## 2019-10-03 MED ORDER — CEFAZOLIN SODIUM-DEXTROSE 2-4 GM/100ML-% IV SOLN: 2.0000 g | INTRAVENOUS | Status: DC

## 2019-10-03 MED ORDER — ONDANSETRON HCL 4 MG/2ML IJ SOLN
INTRAMUSCULAR | Status: DC | PRN
  Administered 2019-10-03: 4 mg via INTRAVENOUS

## 2019-10-03 MED ORDER — BACITRACIN-NEOMYCIN-POLYMYXIN 400-5-5000 EX OINT
TOPICAL_OINTMENT | CUTANEOUS | Status: DC | PRN
  Administered 2019-10-03: 1 via TOPICAL

## 2019-10-03 MED ORDER — CHLORHEXIDINE GLUCONATE CLOTH 2 % EX PADS: 6.0000 | MEDICATED_PAD | Freq: Once | CUTANEOUS | Status: DC

## 2019-10-03 MED ORDER — BUPIVACAINE-EPINEPHRINE 0.5% -1:200000 IJ SOLN
INTRAMUSCULAR | Status: AC
  Filled 2019-10-03: qty 1

## 2019-10-03 MED ORDER — FENTANYL CITRATE (PF) 250 MCG/5ML IJ SOLN
INTRAMUSCULAR | Status: AC
  Filled 2019-10-03: qty 5

## 2019-10-03 MED ORDER — SODIUM CHLORIDE 0.9 % IV SOLN
INTRAVENOUS | Status: DC | PRN
  Administered 2019-10-03: 500 mL

## 2019-10-03 MED ORDER — BACLOFEN 10 MG/20ML IT SOLN
INTRATHECAL | Status: DC | PRN
  Administered 2019-10-03: 10 mg via INTRATHECAL

## 2019-10-03 MED ORDER — ONDANSETRON HCL 4 MG/2ML IJ SOLN
INTRAMUSCULAR | Status: AC
  Filled 2019-10-03: qty 2

## 2019-10-03 MED ORDER — HYDROMORPHONE HCL 1 MG/ML IJ SOLN
0.2500 mg | INTRAMUSCULAR | Status: DC | PRN
  Administered 2019-10-03 (×2): 0.5 mg via INTRAVENOUS

## 2019-10-03 MED ORDER — OXYCODONE HCL 5 MG PO TABS: 5.0000 mg | ORAL_TABLET | Freq: Once | ORAL | Status: DC | PRN

## 2019-10-03 MED ORDER — HYDROMORPHONE HCL 1 MG/ML IJ SOLN
INTRAMUSCULAR | Status: AC
  Filled 2019-10-03: qty 1

## 2019-10-03 MED ORDER — KETOROLAC TROMETHAMINE 30 MG/ML IJ SOLN
30.0000 mg | Freq: Once | INTRAMUSCULAR | Status: AC | PRN
  Administered 2019-10-03: 30 mg via INTRAVENOUS

## 2019-10-03 MED ORDER — PROPOFOL 10 MG/ML IV BOLUS
INTRAVENOUS | Status: AC
  Filled 2019-10-03: qty 20

## 2019-10-03 MED ORDER — 0.9 % SODIUM CHLORIDE (POUR BTL) OPTIME
TOPICAL | Status: DC | PRN
  Administered 2019-10-03: 1000 mL

## 2019-10-03 MED ORDER — LACTATED RINGERS IV SOLN
INTRAVENOUS | Status: DC
  Administered 2019-10-03 (×2): via INTRAVENOUS

## 2019-10-03 MED ORDER — PROMETHAZINE HCL 25 MG/ML IJ SOLN: 6.2500 mg | INTRAMUSCULAR | Status: DC | PRN

## 2019-10-03 MED ORDER — PROPOFOL 10 MG/ML IV BOLUS
INTRAVENOUS | Status: DC | PRN
  Administered 2019-10-03: 200 mg via INTRAVENOUS

## 2019-10-03 SURGICAL SUPPLY — 53 items
BAG DECANTER FOR FLEXI CONT (MISCELLANEOUS) ×2 IMPLANT
BINDER ABDOMINAL 12 ML 46-62 (SOFTGOODS) ×2 IMPLANT
BLADE CLIPPER SURG (BLADE) ×2 IMPLANT
BOOT SUTURE AID YELLOW STND (SUTURE) ×2 IMPLANT
CATH ASCENDA 1PIECE (Catheter) ×2 IMPLANT
CHLORAPREP W/TINT 26 (MISCELLANEOUS) ×2 IMPLANT
COVER WAND RF STERILE (DRAPES) ×2 IMPLANT
DERMABOND ADVANCED (GAUZE/BANDAGES/DRESSINGS)
DERMABOND ADVANCED .7 DNX12 (GAUZE/BANDAGES/DRESSINGS) IMPLANT
DRAPE C-ARMOR (DRAPES) ×2 IMPLANT
DRAPE INCISE IOBAN 66X45 STRL (DRAPES) IMPLANT
DRAPE LAPAROTOMY T 102X78X121 (DRAPES) IMPLANT
DRAPE ORTHO SPLIT 77X108 STRL (DRAPES) ×2
DRAPE SURG 17X23 STRL (DRAPES) ×8 IMPLANT
DRAPE SURG ORHT 6 SPLT 77X108 (DRAPES) ×2 IMPLANT
ELECT REM PT RETURN 9FT ADLT (ELECTROSURGICAL) ×2
ELECTRODE REM PT RTRN 9FT ADLT (ELECTROSURGICAL) ×1 IMPLANT
GAUZE 4X4 16PLY RFD (DISPOSABLE) IMPLANT
GLOVE BIOGEL PI IND STRL 7.5 (GLOVE) ×1 IMPLANT
GLOVE BIOGEL PI INDICATOR 7.5 (GLOVE) ×1
GLOVE ECLIPSE 7.5 STRL STRAW (GLOVE) IMPLANT
GOWN STRL REUS W/ TWL LRG LVL3 (GOWN DISPOSABLE) IMPLANT
GOWN STRL REUS W/ TWL XL LVL3 (GOWN DISPOSABLE) IMPLANT
GOWN STRL REUS W/TWL 2XL LVL3 (GOWN DISPOSABLE) IMPLANT
GOWN STRL REUS W/TWL LRG LVL3 (GOWN DISPOSABLE)
GOWN STRL REUS W/TWL XL LVL3 (GOWN DISPOSABLE)
KIT BASIN OR (CUSTOM PROCEDURE TRAY) ×4 IMPLANT
KIT REFILL (MISCELLANEOUS) ×1
KIT REFILL CATH SYNCHROMED II (MISCELLANEOUS) ×1 IMPLANT
KIT TURNOVER KIT B (KITS) ×2 IMPLANT
NEEDLE HYPO 18GX1.5 BLUNT FILL (NEEDLE) ×2 IMPLANT
NEEDLE HYPO 25X1 1.5 SAFETY (NEEDLE) ×2 IMPLANT
NS IRRIG 1000ML POUR BTL (IV SOLUTION) ×2 IMPLANT
PACK LAMINECTOMY NEURO (CUSTOM PROCEDURE TRAY) ×2 IMPLANT
PAD ARMBOARD 7.5X6 YLW CONV (MISCELLANEOUS) ×2 IMPLANT
POUCH TYRX NEURO LRG (Mesh General) ×2 IMPLANT
PUMP SYNCHROMED II 20ML RESVR (Vascular Products) ×2 IMPLANT
SPONGE LAP 4X18 RFD (DISPOSABLE) IMPLANT
SPONGE SURGIFOAM ABS GEL SZ50 (HEMOSTASIS) IMPLANT
STAPLER SKIN PROX WIDE 3.9 (STAPLE) ×2 IMPLANT
SUT MNCRL AB 4-0 PS2 18 (SUTURE) ×2 IMPLANT
SUT SILK 0 (SUTURE) ×1
SUT SILK 0 MO-6 18XCR BRD 8 (SUTURE) ×1 IMPLANT
SUT SILK 0 TIES 10X30 (SUTURE) IMPLANT
SUT SILK 2 0 TIES 10X30 (SUTURE) IMPLANT
SUT VIC AB 2-0 CP2 18 (SUTURE) ×4 IMPLANT
SYR 10ML LL (SYRINGE) IMPLANT
SYR 20ML LL LF (SYRINGE) ×2 IMPLANT
SYR 3ML LL SCALE MARK (SYRINGE) ×2 IMPLANT
TOWEL GREEN STERILE (TOWEL DISPOSABLE) ×2 IMPLANT
TOWEL GREEN STERILE FF (TOWEL DISPOSABLE) ×2 IMPLANT
WATER STERILE IRR 1000ML POUR (IV SOLUTION) ×2 IMPLANT
YANKAUER SUCT BULB TIP NO VENT (SUCTIONS) ×2 IMPLANT

## 2019-10-03 NOTE — Transfer of Care (Signed)
Immediate Anesthesia Transfer of Care Note  Patient: Sean Horton  Procedure(s) Performed: Intrathecal baclofen pump permanent placement (N/A )  Patient Location: PACU  Anesthesia Type:General  Level of Consciousness: awake and alert   Airway & Oxygen Therapy: Patient Spontanous Breathing and Patient connected to nasal cannula oxygen  Post-op Assessment: Report given to RN and Post -op Vital signs reviewed and stable  Post vital signs: Reviewed and stable  Last Vitals:  Vitals Value Taken Time  BP 148/86 10/03/19 1317  Temp    Pulse 71 10/03/19 1318  Resp 12 10/03/19 1318  SpO2 100 % 10/03/19 1318  Vitals shown include unvalidated device data.  Last Pain:  Vitals:   10/03/19 1021  TempSrc:   PainSc: 5          Complications: No apparent anesthesia complications

## 2019-10-03 NOTE — Discharge Instructions (Addendum)

## 2019-10-03 NOTE — Progress Notes (Signed)
Patient lives alone and states he is unsure if anyone will be able to pick him up (other than public transportation) or stay with him post-operatively.  Patient states he will try to contact his mother after work today to see if she can stay with him but is unsure pre-operatively - states she is unaware of his procedure today.

## 2019-10-03 NOTE — Anesthesia Postprocedure Evaluation (Signed)
Anesthesia Post Note  Patient: Sean Horton  Procedure(s) Performed: Intrathecal baclofen pump permanent placement (N/A )     Patient location during evaluation: PACU Anesthesia Type: General Level of consciousness: awake and alert Pain management: pain level controlled Vital Signs Assessment: post-procedure vital signs reviewed and stable Respiratory status: spontaneous breathing, nonlabored ventilation, respiratory function stable and patient connected to nasal cannula oxygen Cardiovascular status: blood pressure returned to baseline and stable Postop Assessment: no apparent nausea or vomiting Anesthetic complications: no    Last Vitals:  Vitals:   10/03/19 1332 10/03/19 1347  BP: 135/81 138/81  Pulse: 81 68  Resp: 11 11  Temp:    SpO2: 100% 100%    Last Pain:  Vitals:   10/03/19 1332  TempSrc:   PainSc: 7                  Ryan P Ellender

## 2019-10-03 NOTE — Anesthesia Preprocedure Evaluation (Addendum)
Anesthesia Evaluation  Patient identified by MRN, date of birth, ID band Patient awake    Reviewed: Allergy & Precautions, NPO status , Patient's Chart, lab work & pertinent test results  Airway Mallampati: III  TM Distance: >3 FB Neck ROM: Full    Dental no notable dental hx.    Pulmonary Patient abstained from smoking., former smoker,    Pulmonary exam normal breath sounds clear to auscultation       Cardiovascular hypertension, Normal cardiovascular exam Rhythm:Regular Rate:Normal  ECG: rate 71. Sinus rhythm Borderline intraventricular conduction delay   Neuro/Psych  Headaches,  Neuromuscular disease negative psych ROS   GI/Hepatic Neg liver ROS, GERD  Medicated and Controlled,Neurogenic bowel   Endo/Other  negative endocrine ROS  Renal/GU negative Renal ROS     Musculoskeletal T3 spinal cord injury Paraplegia    Abdominal   Peds  Hematology  (+) anemia ,   Anesthesia Other Findings Spasticity  Reproductive/Obstetrics                            Anesthesia Physical Anesthesia Plan  ASA: III  Anesthesia Plan: General   Post-op Pain Management:    Induction: Intravenous  PONV Risk Score and Plan: 2 and Ondansetron, Dexamethasone, Midazolam and Treatment may vary due to age or medical condition  Airway Management Planned: Oral ETT  Additional Equipment:   Intra-op Plan:   Post-operative Plan: Extubation in OR  Informed Consent: I have reviewed the patients History and Physical, chart, labs and discussed the procedure including the risks, benefits and alternatives for the proposed anesthesia with the patient or authorized representative who has indicated his/her understanding and acceptance.     Dental advisory given  Plan Discussed with: CRNA  Anesthesia Plan Comments:        Anesthesia Quick Evaluation

## 2019-10-03 NOTE — Op Note (Signed)
PREOP DX: 1)thoracic cord injury with resultant spastic paraplegia POSTOP DX:1)same as preop  PROCEDURES: 1) implantation of IT catheter 2) implantation of IT pump 4) IT pump programming  SURGEON: Larnie Heart ASSISTANT: none ANESTHESIA: GETA  EBL: <20cc  DESCRIPTION OF PROCEDURE: After a discussion of risks, benefits and alternatives, informed consent was obtained. The patient was taken to the OR, a plane of general anesthesia induced, and the patient intubated by the anesthesiology staff. The patient was then positioned in a proneposition on the OR table,all pressure points were padded, limbs and head all in a neutral position. The patient's flank and lumbar spine were prepped with chloraprep, and draped into a sterile field. A timeout was taken to verify the appropriate patient, position, procedure, availability of equipment and personnel, and administration of perioperative antibiotics.   A lumbar incision was planned at the L3-4level with the use of the fluoroscope. The skin was incised with a 10 blade, and careful sharp dissection carried down to the dorsolumbar fascia with the bovie electrocautery. A 14-gauge Touhy needle was then introduced and advanced under fluoroscopic guidance with a gradual approach to the L2-3 interspace. When clear CSF was obtained then a Medtronic intrathecal catheter was introduced and seen to advance into the posterior elements of the spinal space. Again with the use of the fluoroscope as guidance, the catheter was advanced to its distal end was at the level of the T7 vertebral body. Rubber-shod clamp was placed at the end of the catheter, and an 0 silk suture was placed in a pursestring type fashion about the needle. The needle was then backed off the catheter, and the pursestring tied to prevent further CSF extravasation. Good flow of CSF was noted from the distal end of the catheter. Fixation device was then threaded over the catheter, and then used to  affix the catheter to the dorsolumbar fascia again using the same previously placed pursestring suture.   Attention was then turned toleftlower abdominal flank, and the creation of a subcutaneous pocket for pump implantation. An incision was made, and the subcutaneous pocket created with blunt dissection. The pocket was carefully inspected, and hemostasis was assured. A reverse Seldinger technique was then utilized to pass the intrathecal catheter to the pump pocket.   During the creation of the subcutaneous tissues pocket, on the back table Lioresal (preservative free baclofen) 500 mcg/mL was instilled into the pump after appropriate preparation. The catheter itself was then attached to a side port assembly, and then attached to the side port of the pump itself. CSF flow was maintained throughout all steps, and from the side aspiration port itself at the conclusion of all the connections being made.   With now a functional pump/catheter assembly, the pocket was re-inspected and found to be hemostatic. 0 silk sutures were placed in the medial and lateral corners of the incision, and another interrupted 2--0 silk suture placed in the inferolateral aspect of the pocket. The residual catheter was carefully coiled on the posterior face of the pump, and these sutures were then placed through the retention angles of the pump itself, with care taken to not incorporate or tie off the catheter into the sutures when they were tied; the pump was fixed into place with the sutures in an orientation with the side port directed cephalad, or 12 o'clock position. An antiobiotic-eluting mesh patch was placed posterior to the pump itself  Thelumbar incisionwas then copiously irrigated with bacitracin-containing irrigation, and the incision closed witha 2 layersof 2-0 interrupted vicryl sutures,  and the skin closed withstaples.  Thepocket was then copiously irrigated with bacitracin-containing irrigation, and  the incision closed witha deep layerof 1-0 interrupted vicryl sutures, and the skin closed withrunning 3-0 subcuticular monocryl and staples.Sterile, occlusive dressingswere placed. Needle, instrument and sponge counts were correct x2 at the end of the case.   The pump was programmed then to run a simple continuous infusionofofbaclofen at a total of 150 mcg/24 hours.  The patient was turned onto a stretcher, extubated by the anesthesiology team, and taken to PACU in stable condition.   DRAINS: none  COMPLICATIONS: none CONDITION: stable throughout, and to the PACU  DISPOSITION:plan PACU discharge. Follow up in 2 weeks for pump adjustment and wound check.Case discussed with the patient's family

## 2019-10-07 ENCOUNTER — Encounter (HOSPITAL_COMMUNITY): Payer: Self-pay | Admitting: Anesthesiology

## 2019-10-14 ENCOUNTER — Other Ambulatory Visit: Payer: Self-pay

## 2019-10-14 ENCOUNTER — Telehealth (INDEPENDENT_AMBULATORY_CARE_PROVIDER_SITE_OTHER): Payer: Medicaid Other | Admitting: Family Medicine

## 2019-10-14 DIAGNOSIS — F329 Major depressive disorder, single episode, unspecified: Secondary | ICD-10-CM | POA: Diagnosis not present

## 2019-10-14 DIAGNOSIS — F419 Anxiety disorder, unspecified: Secondary | ICD-10-CM | POA: Diagnosis not present

## 2019-10-14 DIAGNOSIS — F32A Depression, unspecified: Secondary | ICD-10-CM

## 2019-10-14 MED ORDER — BUSPIRONE HCL 5 MG PO TABS: 5.0000 mg | ORAL_TABLET | Freq: Three times a day (TID) | ORAL | 1 refills | Status: AC

## 2019-10-14 NOTE — Progress Notes (Signed)
[id number] - , [place] AT [place]   220#  BP- N/A  T- NO FEVER
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
## 2019-11-08 DIAGNOSIS — Q544 Congenital chordee: Secondary | ICD-10-CM | POA: Insufficient documentation

## 2019-11-24 ENCOUNTER — Telehealth: Payer: Self-pay

## 2019-11-24 DIAGNOSIS — G904 Autonomic dysreflexia: Secondary | ICD-10-CM

## 2019-11-24 NOTE — Telephone Encounter (Signed)
Patient calls nurse line stating [employer] needs a DME order for an air mattress for patient.   Please place and updated DME order and I will send to [employer] for processing.

## 2019-11-25 NOTE — Telephone Encounter (Signed)
Community message sent to [employer] and Darlina Guys @ [facility] to process DME order for mattress.   Christen Bame, CMA

## 2019-12-01 IMAGING — DX DG FEMUR 1V PORT*L*
1 series · 5 of 5 positions shown · non-contrast
Comparison: Radiographs May 09, 2017.

CLINICAL DATA: Nonhealing sore of left hip region.

EXAM:
LEFT FEMUR PORTABLE 1 VIEW

[Series 1: femur · 0.14mm/px · 5 of 5 slices shown]
[im 1/5]
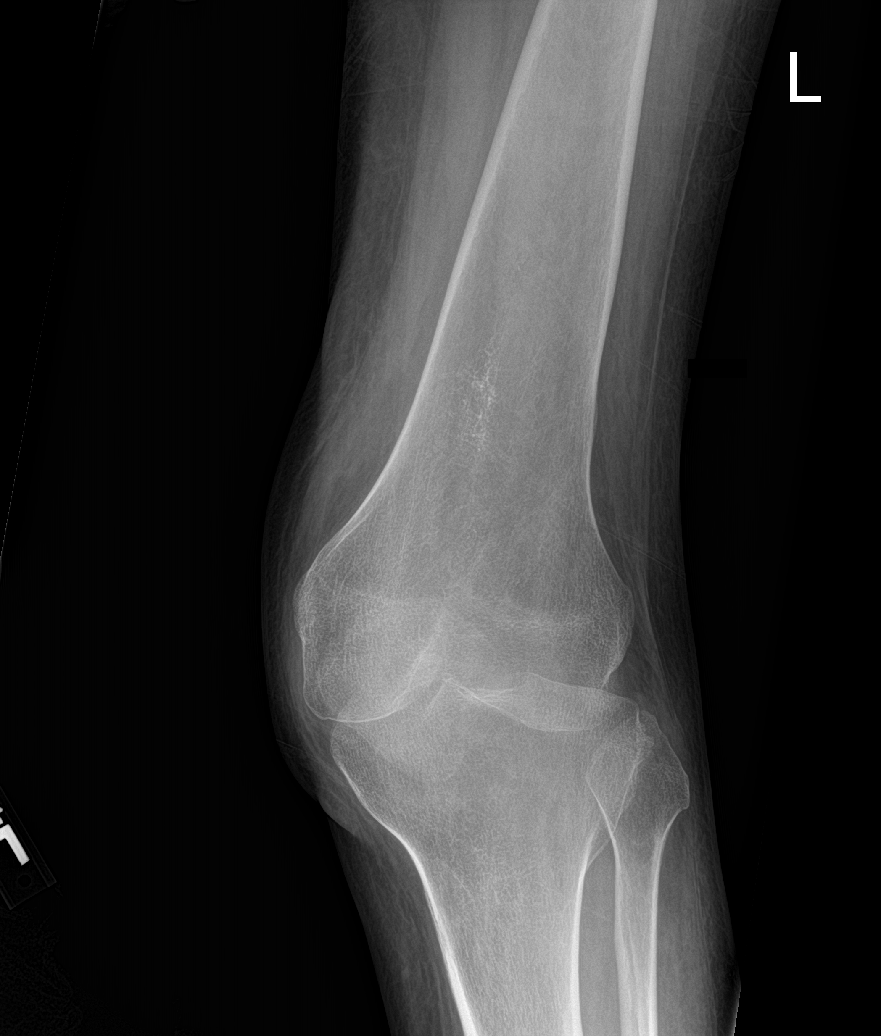
[im 2/5]
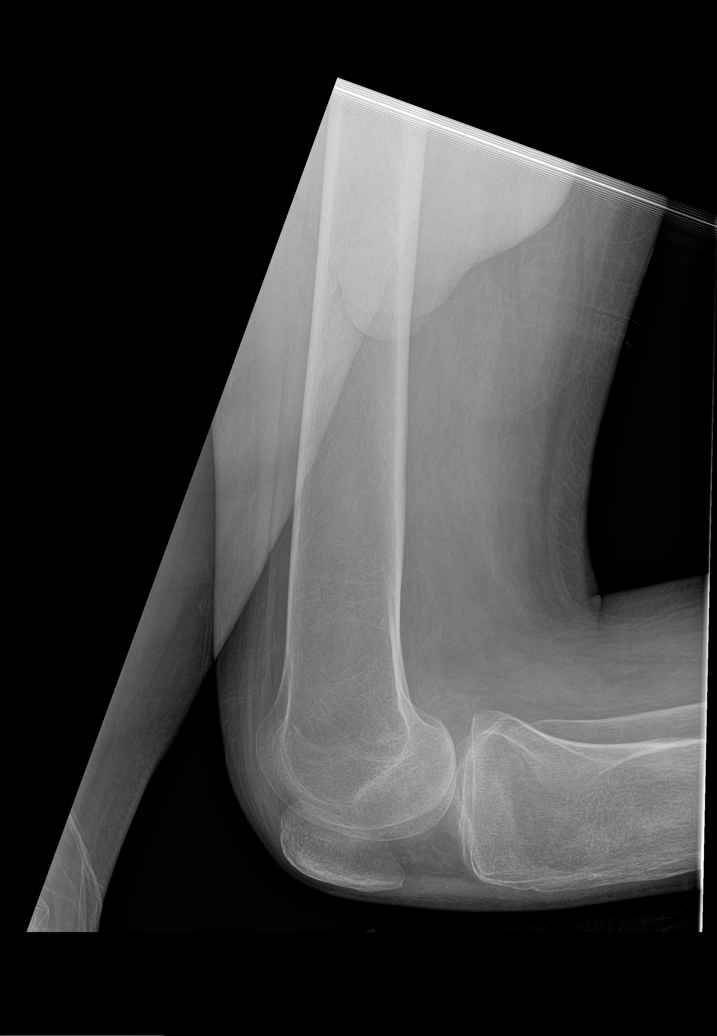
[im 3/5]
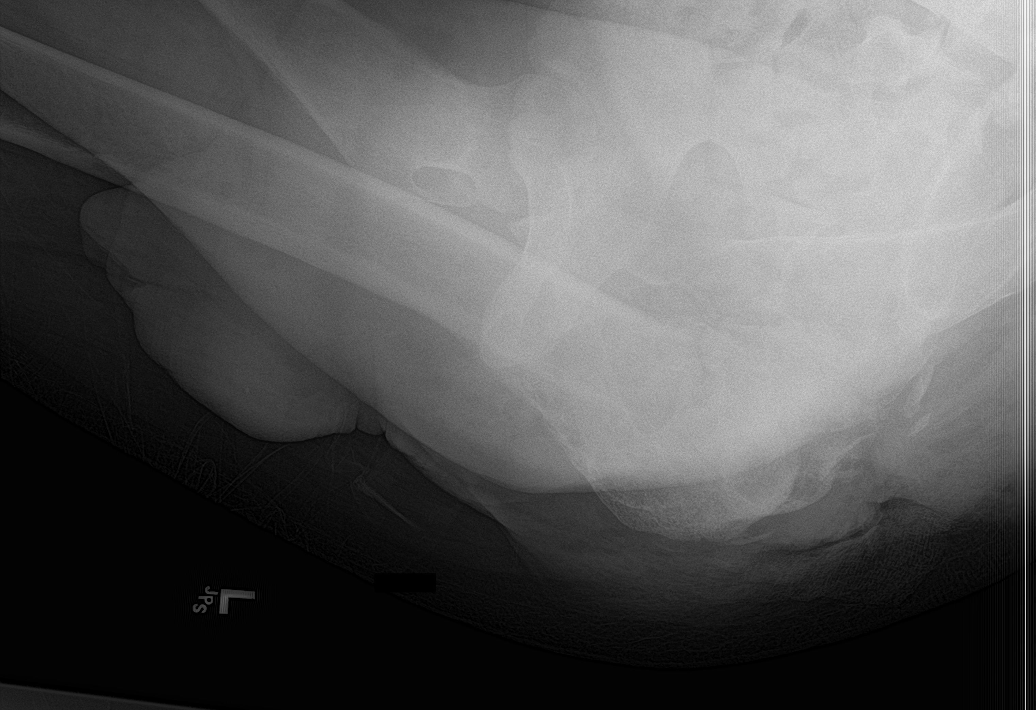
[im 4/5]
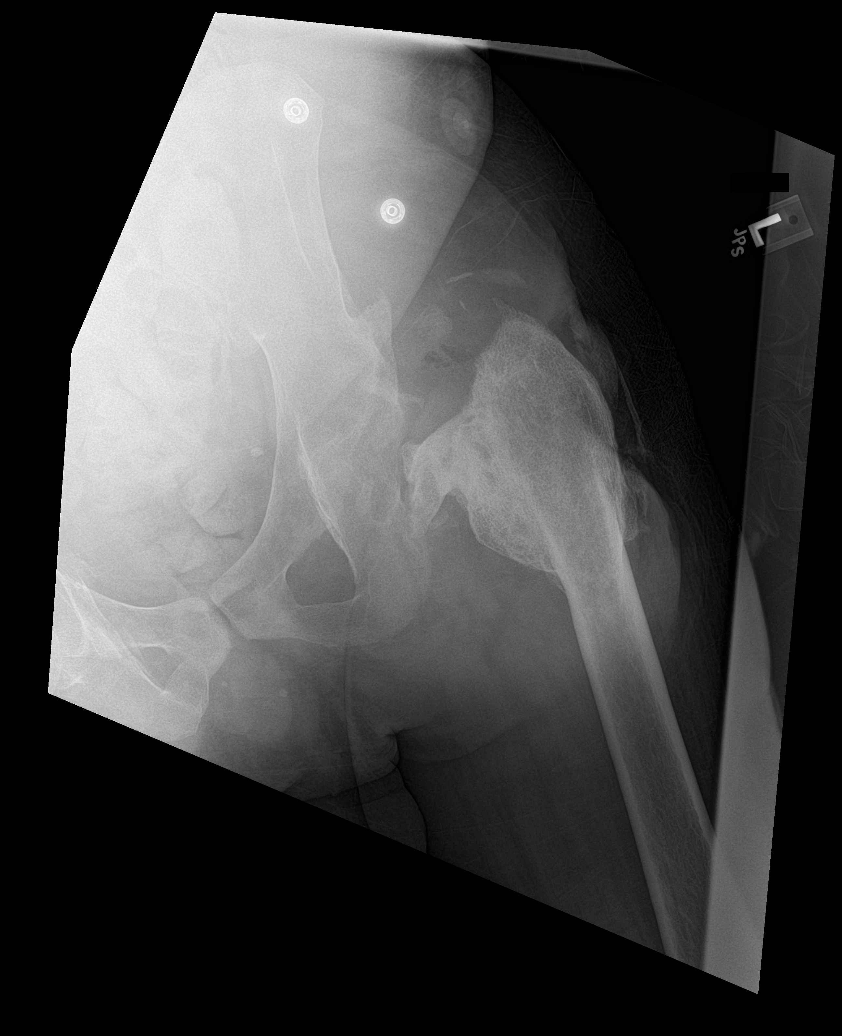
[im 5/5]
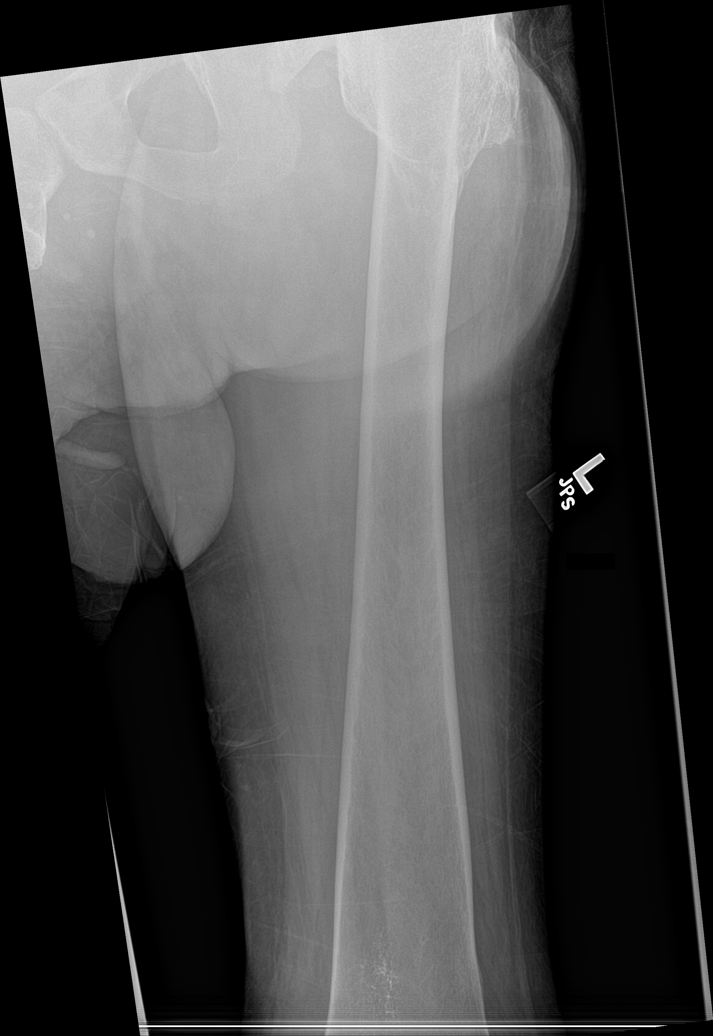

[5 of 5 positions shown; findings below may reference images not displayed]

FINDINGS: There are stable findings consistent with chronic left hip
dislocation with heterotopic bone formation and developmental
dysplasia of the left hip region. Large ulceration is again noted
overlying the greater trochanter. No definite lytic destruction is
seen to suggest osteomyelitis. The remaining portion of the left
femur is unremarkable.
IMPRESSION: Continued presence of large soft tissue ulceration overlying the
greater trochanter of the proximal left hip, with no definite lytic
destruction seen to suggest osteomyelitis.

## 2019-12-04 NOTE — Telephone Encounter (Signed)
Catron, Samuel Germany  Jaquann Guarisco, Laban Emperor, CMA; Catron, Stanford Breed, Melissa; Elon Alas  I see his account had a hold on it for recertification needs on the air mattress. I have sent the new order and notes to the asset recovery team for them to review and advise if there is anything else they need.

## 2019-12-09 NOTE — Telephone Encounter (Signed)
Patient calls nurse line with an update from [employer]. Per patient, he needs a face to face visit with a provider. A video visit is ok, as he is wheelchair bound. Scheduled him for 1/13 with Enid Derry.

## 2019-12-10 ENCOUNTER — Encounter: Payer: Self-pay | Admitting: Family Medicine

## 2019-12-10 ENCOUNTER — Other Ambulatory Visit: Payer: Self-pay

## 2019-12-10 ENCOUNTER — Telehealth (INDEPENDENT_AMBULATORY_CARE_PROVIDER_SITE_OTHER): Payer: Medicaid Other | Admitting: Family Medicine

## 2019-12-10 DIAGNOSIS — F32A Depression, unspecified: Secondary | ICD-10-CM

## 2019-12-10 DIAGNOSIS — G8220 Paraplegia, unspecified: Secondary | ICD-10-CM

## 2019-12-10 DIAGNOSIS — Z8739 Personal history of other diseases of the musculoskeletal system and connective tissue: Secondary | ICD-10-CM

## 2019-12-10 DIAGNOSIS — F329 Major depressive disorder, single episode, unspecified: Secondary | ICD-10-CM

## 2019-12-10 DIAGNOSIS — F419 Anxiety disorder, unspecified: Secondary | ICD-10-CM

## 2019-12-10 MED ORDER — BUPROPION HCL ER (SR) 100 MG PO TB12: 100.0000 mg | ORAL_TABLET | Freq: Two times a day (BID) | ORAL | 1 refills | Status: DC
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
## 2019-12-11 NOTE — Assessment & Plan Note (Signed)
Patient is willing to try Wellbutrin in addition to his Cymbalta.  We will stop the BuSpar given that he is not having any benefits from it.  Could be that he is not on a high enough dose however do think that Wellbutrin may benefit him more in conjunction with the Cymbalta. Also encourage patient to trial counseling, but he declines at this time. Patient to follow-up virtually in 2 to 4 weeks to reassess mood.

## 2020-01-13 ENCOUNTER — Telehealth: Payer: Self-pay | Admitting: Family Medicine

## 2020-01-13 NOTE — Telephone Encounter (Signed)
Called patient to discuss his wounds.  Patient state that his wounds are healed. Denies drainage and odor.  States wounds only healed since he had the air mattress and is afraid wounds will come back if he is unable to keep his air mattress.  Patient has had to have surgery in the past for osteomyelitis for wounds.  Has followed with wound clinic in the past but not recently.  States wounds have taken a mental toll on him and he does not wish wounds to come back. He is "tired" and does not want to have to "keep going to the hospital" related to wounds. States he does not have a preventative plan in place to care for wounds if doesn't have an air mattress.   Patient states that he has some decreased sensation in his lower extremities which is new.  He noticed small movement in his toes.  He is hopeful that this will continue to improve.

## 2020-02-24 ENCOUNTER — Telehealth: Payer: Self-pay

## 2020-02-24 NOTE — Telephone Encounter (Signed)
I've reached out to our team to get an update. Denny Peon, I copied you on the email so you can respond back to [facility] when we get more details.   Thanks all!  Melissa

## 2020-02-24 NOTE — Telephone Encounter (Signed)
Patient calls nurse line stating he will be billed by [employer] for his air mattress if we do not submit "paperwork." Patient stated [employer] has been sending information, however I do not recall any, nor see in his chart. I sent a message to [employer] staff to see if they can be of assistance. Will await there response.

## 2020-03-13 ENCOUNTER — Other Ambulatory Visit: Payer: Self-pay | Admitting: Family Medicine

## 2020-03-19 ENCOUNTER — Telehealth: Payer: Self-pay

## 2020-03-19 NOTE — Telephone Encounter (Signed)
Patient calls nurse line stating he believes he has a UTI. Patient is paraplegic, however has noticed a change in urine color and has noticed a foul smell. Patient stated he get UTIs often. Patient scheduled for virtual visit on Monday, as he does not have transportation. ED precautions given for infection over the weekend. Patient encouraged to drink plenty of water.

## 2020-03-22 ENCOUNTER — Other Ambulatory Visit: Payer: Self-pay

## 2020-03-22 ENCOUNTER — Telehealth (INDEPENDENT_AMBULATORY_CARE_PROVIDER_SITE_OTHER): Payer: Medicaid Other | Admitting: Family Medicine

## 2020-03-22 DIAGNOSIS — N390 Urinary tract infection, site not specified: Secondary | ICD-10-CM

## 2020-03-22 NOTE — Assessment & Plan Note (Signed)
Patient with symptoms consistent with a UTI.  Has sediment in his urine and color change.  Given history of recurrent UTIs with indwelling Foley catheter will need to have in person visit to assess further.  Patient also reports some penile discharge which is concerning.  I think he will need a physical as well as a UA.  Would consider gonococcal testing as well.  Have placed in person visit appointment for tomorrow.  ED cautions discussed with patient.  Patient has difficulty with transportation but will ask family members or try and arrange himself.  If he is unable to arrange transportation prior to tomorrow's appointment he will call our clinic to have assistance with transportation.

## 2020-03-22 NOTE — Progress Notes (Signed)
No vitals taken at home.  .Sean Horton R Maudean Hoffmann, CMA  

## 2020-03-22 NOTE — Progress Notes (Signed)
[place] Telemedicine Visit I connected with  Sean Horton on 03/22/20 by a video enabled telemedicine application and verified that I am speaking with the correct person using two identifiers.   I discussed the limitations of evaluation and management by telemedicine. The patient expressed understanding and agreed to proceed.   Patient consented to have virtual visit and was identified by name and date of birth. Method of visit: Video  Encounter participants: Patient: Sean Horton - located at Home Provider: Caroline More - located at [facility] Others (if applicable): None  Chief Complaint: concern for UTI and elevated BP  HPI:  UTI Patient states he thinks he has a UTI. States he has "stuff in urine" and not the color it is supposed to be. States his BP is also elevated. Bought some AZO which helped on Saturday and Sunday but this morning he felt his BP go up. Reports he sees stuff floating in his urine. No blood. Does note penile discharge. Patient is not sexually active.  No fevers or new back pain.   Elevated BP Is not sure what his BP was but was told on Friday it was 165/80-90 at pain clinic. Feels about the same since, has not checked it. Patient at baseline does not have BP issues. Normally only has high blood pressure when he has an infection. No CP, SOB, or swelling. Does have pulsatile headaches on occasion, but states this improves.   ROS: per HPI  Pertinent PMHx: HTN, paraplegia, chronic indwelling foley  Exam:  There were no vitals taken for this visit.  Gen: awake and alert, NAD Respiratory: speaking full sentences, no increased WOB   Assessment/Plan:  Recurrent UTI Patient with symptoms consistent with a UTI.  Has sediment in his urine and color change.  Given history of recurrent UTIs with indwelling Foley catheter will need to have in person visit to assess further.  Patient also reports some penile discharge which is concerning.   I think he will need a physical as well as a UA.  Would consider gonococcal testing as well.  Have placed in person visit appointment for tomorrow.  ED cautions discussed with patient.  Patient has difficulty with transportation but will ask family members or try and arrange himself.  If he is unable to arrange transportation prior to tomorrow's appointment he will call our clinic to have assistance with transportation.  HYPERTENSION, SYSTOLIC Patient with symptoms of elevated blood pressure as well as headache.  Has a history of hypertension listed in his past medical history but no medications.  As needed hydralazine as listed but patient states he takes nothing for his blood pressure.  I advised that he will need in person visit as well as neurological exam especially given headaches.  May also need to look at his retinas to rule out papilledema.  Patient is agreeable to this.  In person visit scheduled for tomorrow, there was no visits open today.  Will try and arrange transportation himself but he if he is unable to we will plan to call her clinic beforehand.   Discussed with Dr. Owens Shark  Time spent during visit with patient: 15 minutes

## 2020-03-22 NOTE — Assessment & Plan Note (Signed)
Patient with symptoms of elevated blood pressure as well as headache.  Has a history of hypertension listed in his past medical history but no medications.  As needed hydralazine as listed but patient states he takes nothing for his blood pressure.  I advised that he will need in person visit as well as neurological exam especially given headaches.  May also need to look at his retinas to rule out papilledema.  Patient is agreeable to this.  In person visit scheduled for tomorrow, there was no visits open today.  Will try and arrange transportation himself but he if he is unable to we will plan to call her clinic beforehand.

## 2020-03-23 ENCOUNTER — Telehealth: Payer: Self-pay | Admitting: *Deleted

## 2020-03-23 ENCOUNTER — Ambulatory Visit: Payer: Medicaid Other

## 2020-03-23 NOTE — Telephone Encounter (Signed)
Pt calls to let us know he will not be able to keep his appt this afternoon due to transportation.  He did "find some cipro" from a previous script.  Advised that I could not tell him to take that and discussed the importance of taking the whole script when given.  He understands.  Also states that the BP issue "always happens when he has a UTI, my BP goes up and down".  Red flags given to go to ED.  Again recommended he seek care either here or UC. Christen Bame, CMA

## 2020-03-23 NOTE — Telephone Encounter (Signed)
If transportation is an issue is there a way we can assist with his transport? When I spoke to him yesterday he was having elevated BP as well as HA so I think he should be seen in person.   I am happy to put in a CCM referral if you think this is an appropriate referral.  Caroline More, DO, PGY-3 [facility] 03/23/2020 2:44 PM

## 2020-03-24 ENCOUNTER — Ambulatory Visit: Payer: Self-pay | Admitting: Licensed Clinical Social Worker

## 2020-03-24 NOTE — Chronic Care Management (AMB) (Signed)
   Social Work  Care Management Consultation  03/24/2020 Name: Sean Horton MRN: [id number] DOB: Mar 30, 1986  Sean Horton is a 34 y.o. year old male who sees [place], [place], DO for primary care. LCSW was consulted by Dr. Zara Chess for information /resources to assistance patient with transportation to [facility] . Patient was not interviewed or contacted during this encounter.     Recommendation: After consultation with [facility] Administrator it is determined that patient may benefit from coming into the office.  Administrator will contact patient to discuss options and provide Taxi voucher if needed.    Intervention: Relevant information and resources discussed and shared [facility]    Review of patient status, including review of consultants reports, relevant laboratory and other test results, and collaboration with appropriate care team members and the patient's provider was performed as part of comprehensive patient evaluation and provision of chronic care management services.    Plan:  1.The care management team is available to follow up with the patient if further intervention is needed.  2. Please consult with patient and place formal CCM referral  3. No further follow up required by LCSW at this time  Casimer Lanius, [place] / [place]   [phone] 9:54 AM

## 2020-03-24 NOTE — Telephone Encounter (Signed)
Consulted with Casimer Lanius, LCSW.  We can provide transportation via [employer].  Contacted pt, he declines appt as he is feeling better.  Again advised that he should be seen but pt prefers not to.  Red Flags given to go to ED.  Also patient wanted to let Dr. Susa Simmonds know that his buspirone is not helping his anxiety.  Again advised that appt would be needed.  He will "figure out a few things and call back for an appt"  Christen Bame, CMA

## 2020-04-05 IMAGING — DX DG CHEST 1V PORT
1 series · 1 of 1 positions shown · non-contrast
Comparison: Radiograph 07/18/2015

CLINICAL DATA: Dyspnea. Hypertension. Paraplegic

EXAM:
PORTABLE CHEST 1 VIEW

[chest ap]
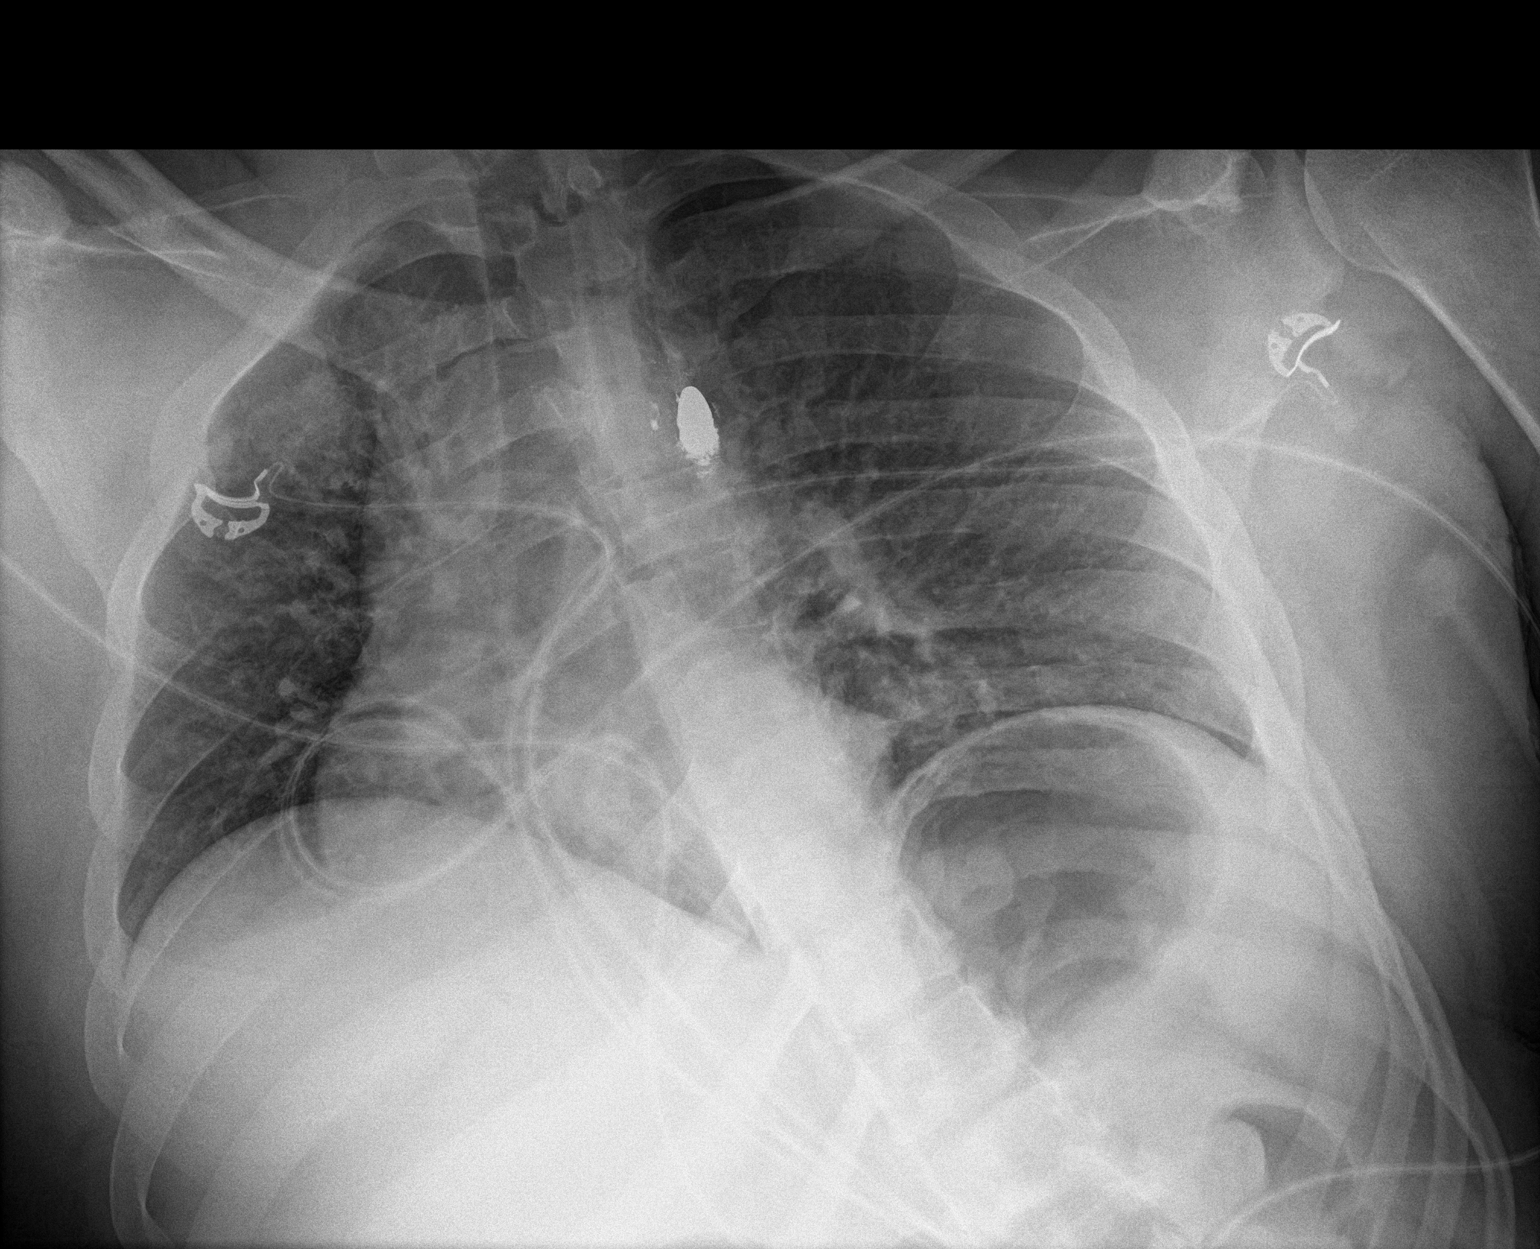

[1 of 1 positions shown; findings below may reference images not displayed]

FINDINGS: Patient rotated. Normal cardiac silhouette. Low lung volumes. No
effusion, infiltrate, or pneumothorax. Blood fragment projecting
over the spine.
IMPRESSION: Low lung volumes. No acute cardiopulmonary findings.

## 2020-05-07 NOTE — Telephone Encounter (Signed)
Please see below message to [employer]  Patient calls office stating that he is receiving multiple calls from [employer] regarding payment for air mattress. Patient states that provider is supposed to be signing paperwork from [place]. Unable to locate any paperwork on our end. Please advise if there is anything further you all need from our office in order to get this processed.   Thanks  Talbot Grumbling, RN

## 2020-05-14 NOTE — Telephone Encounter (Signed)
Called and spoke with [employer]. Per representative, there is nothing needed from our office and the hold on patient's file has been removed.   Called and informed patient.   Patient appreciative.   Talbot Grumbling, RN

## 2020-05-19 ENCOUNTER — Telehealth: Payer: Self-pay | Admitting: *Deleted

## 2020-05-19 NOTE — Telephone Encounter (Signed)
Patient LM on RN line because he was told by [facility] that he will need a face to face visit before they can provide him with a replacement.  He states that it can be virtual.  Called back to speak with him and get him scheduled (likely an in person appt) but had to LM.   Will awiat callback and will also forward to PCP to see if virtual is appropriate for her. Christen Bame, CMA

## 2020-05-20 NOTE — Telephone Encounter (Signed)
Patient returns call to nurse line to schedule face to face apt. Apt scheduled for 05/27/2020. I did schedule this as virtual, per patient that is fine with [facility]. If I need to change to in person, per PCP request, just let me know. I did advise him he may have to physically come in.

## 2020-05-22 ENCOUNTER — Emergency Department (HOSPITAL_COMMUNITY): Payer: Medicaid Other

## 2020-05-22 ENCOUNTER — Inpatient Hospital Stay (HOSPITAL_COMMUNITY)
Admission: EM | Admit: 2020-05-22 | Discharge: 2020-05-29 | DRG: 698 | Disposition: A | Discharge status: Home / Self Care | Payer: Medicaid Other | Attending: Internal Medicine | Admitting: Internal Medicine

## 2020-05-22 ENCOUNTER — Other Ambulatory Visit: Payer: Self-pay

## 2020-05-22 DIAGNOSIS — R531 Weakness: Secondary | ICD-10-CM | POA: Diagnosis not present

## 2020-05-22 DIAGNOSIS — D72829 Elevated white blood cell count, unspecified: Secondary | ICD-10-CM

## 2020-05-22 DIAGNOSIS — S90821A Blister (nonthermal), right foot, initial encounter: Secondary | ICD-10-CM | POA: Diagnosis present

## 2020-05-22 DIAGNOSIS — G43909 Migraine, unspecified, not intractable, without status migrainosus: Secondary | ICD-10-CM | POA: Diagnosis present

## 2020-05-22 DIAGNOSIS — G904 Autonomic dysreflexia: Secondary | ICD-10-CM | POA: Diagnosis present

## 2020-05-22 DIAGNOSIS — R17 Unspecified jaundice: Secondary | ICD-10-CM | POA: Diagnosis not present

## 2020-05-22 DIAGNOSIS — G8220 Paraplegia, unspecified: Secondary | ICD-10-CM | POA: Diagnosis not present

## 2020-05-22 DIAGNOSIS — G8929 Other chronic pain: Secondary | ICD-10-CM | POA: Diagnosis not present

## 2020-05-22 DIAGNOSIS — Z87891 Personal history of nicotine dependence: Secondary | ICD-10-CM | POA: Diagnosis not present

## 2020-05-22 DIAGNOSIS — R52 Pain, unspecified: Secondary | ICD-10-CM

## 2020-05-22 DIAGNOSIS — A419 Sepsis, unspecified organism: Secondary | ICD-10-CM | POA: Diagnosis present

## 2020-05-22 DIAGNOSIS — D509 Iron deficiency anemia, unspecified: Secondary | ICD-10-CM | POA: Diagnosis present

## 2020-05-22 DIAGNOSIS — F329 Major depressive disorder, single episode, unspecified: Secondary | ICD-10-CM | POA: Diagnosis present

## 2020-05-22 DIAGNOSIS — N390 Urinary tract infection, site not specified: Secondary | ICD-10-CM | POA: Diagnosis present

## 2020-05-22 DIAGNOSIS — Z833 Family history of diabetes mellitus: Secondary | ICD-10-CM

## 2020-05-22 DIAGNOSIS — L89512 Pressure ulcer of right ankle, stage 2: Secondary | ICD-10-CM | POA: Diagnosis present

## 2020-05-22 DIAGNOSIS — Z8249 Family history of ischemic heart disease and other diseases of the circulatory system: Secondary | ICD-10-CM

## 2020-05-22 DIAGNOSIS — G894 Chronic pain syndrome: Secondary | ICD-10-CM | POA: Diagnosis present

## 2020-05-22 DIAGNOSIS — Z978 Presence of other specified devices: Secondary | ICD-10-CM | POA: Diagnosis not present

## 2020-05-22 DIAGNOSIS — L89000 Pressure ulcer of unspecified elbow, unstageable: Secondary | ICD-10-CM | POA: Diagnosis not present

## 2020-05-22 DIAGNOSIS — S24112S Complete lesion at T2-T6 level of thoracic spinal cord, sequela: Secondary | ICD-10-CM

## 2020-05-22 DIAGNOSIS — T83518A Infection and inflammatory reaction due to other urinary catheter, initial encounter: Secondary | ICD-10-CM | POA: Diagnosis present

## 2020-05-22 DIAGNOSIS — Z79899 Other long term (current) drug therapy: Secondary | ICD-10-CM

## 2020-05-22 DIAGNOSIS — E1165 Type 2 diabetes mellitus with hyperglycemia: Secondary | ICD-10-CM | POA: Diagnosis not present

## 2020-05-22 DIAGNOSIS — L8990 Pressure ulcer of unspecified site, unspecified stage: Secondary | ICD-10-CM | POA: Diagnosis present

## 2020-05-22 DIAGNOSIS — K592 Neurogenic bowel, not elsewhere classified: Secondary | ICD-10-CM | POA: Diagnosis present

## 2020-05-22 DIAGNOSIS — Z87440 Personal history of urinary (tract) infections: Secondary | ICD-10-CM | POA: Diagnosis not present

## 2020-05-22 DIAGNOSIS — N179 Acute kidney failure, unspecified: Secondary | ICD-10-CM | POA: Diagnosis present

## 2020-05-22 DIAGNOSIS — Z20822 Contact with and (suspected) exposure to covid-19: Secondary | ICD-10-CM | POA: Diagnosis present

## 2020-05-22 DIAGNOSIS — Z1624 Resistance to multiple antibiotics: Secondary | ICD-10-CM | POA: Diagnosis present

## 2020-05-22 DIAGNOSIS — Y846 Urinary catheterization as the cause of abnormal reaction of the patient, or of later complication, without mention of misadventure at the time of the procedure: Secondary | ICD-10-CM | POA: Diagnosis present

## 2020-05-22 DIAGNOSIS — E119 Type 2 diabetes mellitus without complications: Secondary | ICD-10-CM

## 2020-05-22 DIAGNOSIS — W3400XS Accidental discharge from unspecified firearms or gun, sequela: Secondary | ICD-10-CM | POA: Diagnosis not present

## 2020-05-22 DIAGNOSIS — N319 Neuromuscular dysfunction of bladder, unspecified: Secondary | ICD-10-CM | POA: Diagnosis present

## 2020-05-22 DIAGNOSIS — L89309 Pressure ulcer of unspecified buttock, unspecified stage: Secondary | ICD-10-CM | POA: Diagnosis not present

## 2020-05-22 DIAGNOSIS — E872 Acidosis: Secondary | ICD-10-CM | POA: Diagnosis present

## 2020-05-22 DIAGNOSIS — I10 Essential (primary) hypertension: Secondary | ICD-10-CM | POA: Diagnosis present

## 2020-05-22 LAB — CBC WITH DIFFERENTIAL/PLATELET
Abs Immature Granulocytes: 0.19 10*3/uL — ABNORMAL HIGH (ref 0.00–0.07)
Basophils Absolute: 0 10*3/uL (ref 0.0–0.1)
Basophils Relative: 0 %
Eosinophils Absolute: 0 10*3/uL (ref 0.0–0.5)
Eosinophils Relative: 0 %
HCT: 37.6 % — ABNORMAL LOW (ref 39.0–52.0)
Hemoglobin: 11.9 g/dL — ABNORMAL LOW (ref 13.0–17.0)
Immature Granulocytes: 1 %
Lymphocytes Relative: 8 %
Lymphs Abs: 1.2 10*3/uL (ref 0.7–4.0)
MCH: 26.2 pg (ref 26.0–34.0)
MCHC: 31.6 g/dL (ref 30.0–36.0)
MCV: 82.8 fL (ref 80.0–100.0)
Monocytes Absolute: 1.4 10*3/uL — ABNORMAL HIGH (ref 0.1–1.0)
Monocytes Relative: 9 %
Neutro Abs: 13.5 10*3/uL — ABNORMAL HIGH (ref 1.7–7.7)
Neutrophils Relative %: 82 %
Platelets: 431 10*3/uL — ABNORMAL HIGH (ref 150–400)
RBC: 4.54 MIL/uL (ref 4.22–5.81)
RDW: 14.7 % (ref 11.5–15.5)
WBC: 16.4 10*3/uL — ABNORMAL HIGH (ref 4.0–10.5)
nRBC: 0 % (ref 0.0–0.2)

## 2020-05-22 LAB — URINALYSIS, ROUTINE W REFLEX MICROSCOPIC
Bilirubin Urine: NEGATIVE
Glucose, UA: 50 mg/dL — AB
Ketones, ur: NEGATIVE mg/dL
Nitrite: POSITIVE — AB
Protein, ur: >=300 mg/dL — AB
Specific Gravity, Urine: 1.022 (ref 1.005–1.030)
pH: 6 (ref 5.0–8.0)

## 2020-05-22 LAB — PROTIME-INR
INR: 1.2 (ref 0.8–1.2)
Prothrombin Time: 14.4 seconds (ref 11.4–15.2)

## 2020-05-22 LAB — COMPREHENSIVE METABOLIC PANEL
ALT: 36 U/L (ref 0–44)
AST: 24 U/L (ref 15–41)
Albumin: 3.2 g/dL — ABNORMAL LOW (ref 3.5–5.0)
Alkaline Phosphatase: 201 U/L — ABNORMAL HIGH (ref 38–126)
Anion gap: 12 (ref 5–15)
BUN: 18 mg/dL (ref 6–20)
CO2: 26 mmol/L (ref 22–32)
Calcium: 9.6 mg/dL (ref 8.9–10.3)
Chloride: 96 mmol/L — ABNORMAL LOW (ref 98–111)
Creatinine, Ser: 1.37 mg/dL — ABNORMAL HIGH (ref 0.61–1.24)
GFR calc Af Amer: >60 mL/min (ref >60)
GFR calc non Af Amer: >60 mL/min (ref >60)
Glucose, Bld: 319 mg/dL — ABNORMAL HIGH (ref 70–99)
Potassium: 4.7 mmol/L (ref 3.5–5.1)
Sodium: 134 mmol/L — ABNORMAL LOW (ref 135–145)
Total Bilirubin: 2.9 mg/dL — ABNORMAL HIGH (ref 0.3–1.2)
Total Protein: 9.3 g/dL — ABNORMAL HIGH (ref 6.5–8.1)

## 2020-05-22 LAB — LACTIC ACID, PLASMA
Lactic Acid, Venous: 3.1 mmol/L (ref 0.5–1.9)
Lactic Acid, Venous: 2.2 mmol/L (ref 0.5–1.9)

## 2020-05-22 LAB — SARS CORONAVIRUS 2 BY RT PCR (HOSPITAL ORDER, PERFORMED IN ~~LOC~~ HOSPITAL LAB): SARS Coronavirus 2: NEGATIVE

## 2020-05-22 MED ORDER — PREGABALIN 100 MG PO CAPS
300.0000 mg | ORAL_CAPSULE | Freq: Two times a day (BID) | ORAL | Status: DC
  Administered 2020-05-23 – 2020-05-26 (×9): 300 mg via ORAL
  Filled 2020-05-22 (×9): qty 3

## 2020-05-22 MED ORDER — OXYCODONE-ACETAMINOPHEN 10-325 MG PO TABS: 1.0000 | ORAL_TABLET | Freq: Three times a day (TID) | ORAL | Status: DC | PRN

## 2020-05-22 MED ORDER — FOLIC ACID 1 MG PO TABS
1.0000 mg | ORAL_TABLET | ORAL | Status: DC
  Administered 2020-05-24 – 2020-05-27 (×2): 1 mg via ORAL
  Filled 2020-05-22 (×2): qty 1

## 2020-05-22 MED ORDER — BACLOFEN 20 MG PO TABS
20.0000 mg | ORAL_TABLET | Freq: Two times a day (BID) | ORAL | Status: DC
  Administered 2020-05-23 – 2020-05-29 (×14): 20 mg via ORAL
  Filled 2020-05-22 (×14): qty 1

## 2020-05-22 MED ORDER — CRANBERRY 250 MG PO TABS: ORAL_TABLET | Freq: Every day | ORAL | Status: DC

## 2020-05-22 MED ORDER — SODIUM CHLORIDE (PF) 0.9 % IJ SOLN
INTRAMUSCULAR | Status: AC
  Filled 2020-05-22: qty 50

## 2020-05-22 MED ORDER — INSULIN DETEMIR 100 UNIT/ML ~~LOC~~ SOLN
5.0000 [IU] | Freq: Every day | SUBCUTANEOUS | Status: DC
  Administered 2020-05-23 – 2020-05-27 (×5): 5 [IU] via SUBCUTANEOUS
  Filled 2020-05-22 (×5): qty 0.05

## 2020-05-22 MED ORDER — INSULIN ASPART 100 UNIT/ML ~~LOC~~ SOLN
0.0000 [IU] | Freq: Three times a day (TID) | SUBCUTANEOUS | Status: DC
  Administered 2020-05-23: 5 [IU] via SUBCUTANEOUS
  Administered 2020-05-23 – 2020-05-25 (×5): 3 [IU] via SUBCUTANEOUS
  Administered 2020-05-25: 2 [IU] via SUBCUTANEOUS
  Administered 2020-05-25 – 2020-05-26 (×2): 3 [IU] via SUBCUTANEOUS
  Administered 2020-05-26 – 2020-05-27 (×3): 2 [IU] via SUBCUTANEOUS
  Administered 2020-05-27: 3 [IU] via SUBCUTANEOUS
  Administered 2020-05-28 – 2020-05-29 (×4): 2 [IU] via SUBCUTANEOUS

## 2020-05-22 MED ORDER — SODIUM CHLORIDE 0.9 % IV SOLN
2.0000 g | Freq: Once | INTRAVENOUS | Status: AC
  Administered 2020-05-22: 2 g via INTRAVENOUS
  Filled 2020-05-22: qty 2

## 2020-05-22 MED ORDER — METOPROLOL TARTRATE 5 MG/5ML IV SOLN
5.0000 mg | Freq: Four times a day (QID) | INTRAVENOUS | Status: DC | PRN
  Administered 2020-05-24: 5 mg via INTRAVENOUS
  Filled 2020-05-22: qty 5

## 2020-05-22 MED ORDER — BUSPIRONE HCL 5 MG PO TABS
7.5000 mg | ORAL_TABLET | Freq: Three times a day (TID) | ORAL | Status: DC
  Administered 2020-05-23 – 2020-05-24 (×5): 7.5 mg via ORAL
  Filled 2020-05-22 (×6): qty 2

## 2020-05-22 MED ORDER — ONDANSETRON HCL 4 MG PO TABS: 4.0000 mg | ORAL_TABLET | Freq: Four times a day (QID) | ORAL | Status: DC | PRN

## 2020-05-22 MED ORDER — BUPROPION HCL ER (SR) 100 MG PO TB12
100.0000 mg | ORAL_TABLET | Freq: Two times a day (BID) | ORAL | Status: DC
  Administered 2020-05-23 – 2020-05-29 (×14): 100 mg via ORAL
  Filled 2020-05-22 (×16): qty 1

## 2020-05-22 MED ORDER — LORATADINE 10 MG PO TABS
10.0000 mg | ORAL_TABLET | Freq: Every day | ORAL | Status: DC
  Administered 2020-05-23 – 2020-05-29 (×7): 10 mg via ORAL
  Filled 2020-05-22 (×7): qty 1

## 2020-05-22 MED ORDER — TRAZODONE HCL 50 MG PO TABS: 25.0000 mg | ORAL_TABLET | Freq: Every evening | ORAL | Status: DC | PRN

## 2020-05-22 MED ORDER — FERROUS SULFATE 325 (65 FE) MG PO TABS
325.0000 mg | ORAL_TABLET | Freq: Every day | ORAL | Status: DC
  Administered 2020-05-23 – 2020-05-29 (×7): 325 mg via ORAL
  Filled 2020-05-22 (×7): qty 1

## 2020-05-22 MED ORDER — ONDANSETRON HCL 4 MG/2ML IJ SOLN: 4.0000 mg | Freq: Four times a day (QID) | INTRAMUSCULAR | Status: DC | PRN

## 2020-05-22 MED ORDER — SODIUM CHLORIDE 0.9 % IV SOLN
2.0000 g | Freq: Three times a day (TID) | INTRAVENOUS | Status: DC
  Administered 2020-05-22 – 2020-05-23 (×3): 2 g via INTRAVENOUS
  Filled 2020-05-22 (×4): qty 2

## 2020-05-22 MED ORDER — IBUPROFEN 400 MG PO TABS: 600.0000 mg | ORAL_TABLET | Freq: Three times a day (TID) | ORAL | Status: DC | PRN

## 2020-05-22 MED ORDER — CANNABIDIOL POWD: 1.0000 "application " | Freq: Three times a day (TID) | Status: DC | PRN

## 2020-05-22 MED ORDER — SODIUM CHLORIDE 0.9 % IV BOLUS (SEPSIS)
1000.0000 mL | Freq: Once | INTRAVENOUS | Status: AC
  Administered 2020-05-22: 1000 mL via INTRAVENOUS

## 2020-05-22 MED ORDER — BISACODYL 5 MG PO TBEC: 5.0000 mg | DELAYED_RELEASE_TABLET | Freq: Every day | ORAL | Status: DC | PRN

## 2020-05-22 MED ORDER — IBUPROFEN 200 MG PO TABS
600.0000 mg | ORAL_TABLET | Freq: Once | ORAL | Status: AC
  Administered 2020-05-22: 600 mg via ORAL
  Filled 2020-05-22: qty 3

## 2020-05-22 MED ORDER — SUMATRIPTAN SUCCINATE 25 MG PO TABS
25.0000 mg | ORAL_TABLET | ORAL | Status: DC | PRN
  Administered 2020-05-25: 25 mg via ORAL
  Filled 2020-05-22 (×4): qty 1

## 2020-05-22 MED ORDER — OXYCODONE HCL 5 MG PO TABS
5.0000 mg | ORAL_TABLET | Freq: Three times a day (TID) | ORAL | Status: DC | PRN
  Administered 2020-05-23 – 2020-05-24 (×4): 5 mg via ORAL
  Filled 2020-05-22 (×4): qty 1

## 2020-05-22 MED ORDER — ACETAMINOPHEN 500 MG PO TABS
1000.0000 mg | ORAL_TABLET | Freq: Once | ORAL | Status: AC
  Administered 2020-05-22: 1000 mg via ORAL
  Filled 2020-05-22: qty 2

## 2020-05-22 MED ORDER — OXYCODONE-ACETAMINOPHEN 5-325 MG PO TABS
1.0000 | ORAL_TABLET | Freq: Three times a day (TID) | ORAL | Status: DC | PRN
  Administered 2020-05-23: 1 via ORAL
  Filled 2020-05-22: qty 1

## 2020-05-22 MED ORDER — HYDRALAZINE HCL 25 MG PO TABS
25.0000 mg | ORAL_TABLET | Freq: Three times a day (TID) | ORAL | Status: DC | PRN
  Administered 2020-05-24: 25 mg via ORAL
  Filled 2020-05-22: qty 1

## 2020-05-22 MED ORDER — OXYCODONE HCL ER 20 MG PO T12A
40.0000 mg | EXTENDED_RELEASE_TABLET | Freq: Two times a day (BID) | ORAL | Status: DC
  Administered 2020-05-23 – 2020-05-29 (×14): 40 mg via ORAL
  Filled 2020-05-22 (×14): qty 2

## 2020-05-22 MED ORDER — FENTANYL CITRATE (PF) 100 MCG/2ML IJ SOLN
50.0000 ug | Freq: Once | INTRAMUSCULAR | Status: AC
  Administered 2020-05-22: 50 ug via INTRAVENOUS
  Filled 2020-05-22: qty 2

## 2020-05-22 MED ORDER — ENOXAPARIN SODIUM 40 MG/0.4ML ~~LOC~~ SOLN
40.0000 mg | Freq: Every day | SUBCUTANEOUS | Status: DC
  Administered 2020-05-23 – 2020-05-28 (×7): 40 mg via SUBCUTANEOUS
  Filled 2020-05-22 (×7): qty 0.4

## 2020-05-22 MED ORDER — IOHEXOL 300 MG/ML  SOLN
100.0000 mL | Freq: Once | INTRAMUSCULAR | Status: AC | PRN
  Administered 2020-05-22: 100 mL via INTRAVENOUS

## 2020-05-22 MED ORDER — ACETAMINOPHEN 500 MG PO TABS
500.0000 mg | ORAL_TABLET | Freq: Four times a day (QID) | ORAL | Status: DC | PRN
  Administered 2020-05-23: 1000 mg via ORAL
  Filled 2020-05-22: qty 2

## 2020-05-22 MED ORDER — PANTOPRAZOLE SODIUM 40 MG PO TBEC
40.0000 mg | DELAYED_RELEASE_TABLET | Freq: Every day | ORAL | Status: DC
  Administered 2020-05-23 – 2020-05-29 (×7): 40 mg via ORAL
  Filled 2020-05-22 (×7): qty 1

## 2020-05-22 MED ORDER — DULOXETINE HCL 60 MG PO CPEP
60.0000 mg | ORAL_CAPSULE | Freq: Two times a day (BID) | ORAL | Status: DC
  Administered 2020-05-23 – 2020-05-29 (×14): 60 mg via ORAL
  Filled 2020-05-22 (×14): qty 1

## 2020-05-22 MED ORDER — SODIUM CHLORIDE 0.9 % IV SOLN: INTRAVENOUS | Status: DC

## 2020-05-22 MED ORDER — POLYETHYLENE GLYCOL 3350 17 G PO PACK: 17.0000 g | PACK | Freq: Every day | ORAL | Status: DC | PRN

## 2020-05-22 MED ORDER — SODIUM CHLORIDE 0.9 % IV SOLN: 2.0000 g | Freq: Once | INTRAVENOUS | Status: DC

## 2020-05-22 NOTE — Progress Notes (Signed)
Pharmacy Antibiotic Note  Sean Horton is a 34 y.o. male admitted on 05/22/2020 with UTI with sepsis.  PMH significant for paraplegia, neurogenic bladder, chronic suprapubic catheter.  Pharmacy has been consulted for Cefepime dosing.  Plan: Cefepime 2gm IV q8h F/U cultures and sensitivities  Height: 6\' 5"  (195.6 cm) Weight: 104 kg (229 lb 4.5 oz) IBW/kg (Calculated) : 89.1  Temp (24hrs), Avg:100.9 F (38.3 C), Min:98.5 F (36.9 C), Max:102.3 F (39.1 C)  Recent Labs  Lab 05/22/20 1351 05/22/20 1545  WBC 16.4*  --   CREATININE 1.37*  --   LATICACIDVEN 3.1* 2.2*    Estimated Creatinine Clearance: 95.7 mL/min (A) (by C-G formula based on SCr of 1.37 mg/dL (H)).    No Known Allergies  Antimicrobials this admission: 6/26 Cefepime >>     Dose adjustments this admission:   Microbiology results: 6/26 BCx:   6/26 UCx:   6/26 Covid 19:  negative   Thank you for allowing pharmacy to be a part of this patient's care.  Everette Rank, PharmD 05/22/2020 8:05 PM
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
## 2020-05-22 NOTE — Progress Notes (Signed)
PHARMACIST - PHYSICIAN ORDER COMMUNICATION  CONCERNING: P&T Medication Policy on Herbal Medications  DESCRIPTION:  This patient's order for:  Cranberry tablets, cannabinoid cream  has been noted.  This product(s) is classified as an "herbal" or natural product. Due to a lack of definitive safety studies or FDA approval, nonstandard manufacturing practices, plus the potential risk of unknown drug-drug interactions while on inpatient medications, the Pharmacy and Therapeutics Committee does not permit the use of "herbal" or natural products of this type within [facility].   ACTION TAKEN: The pharmacy department is unable to verify this order at this time. Please reevaluate patient's clinical condition at discharge and address if the herbal or natural product(s) should be resumed at that time.  Lenis Noon, PharmD 05/22/20 11:31 PM
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
## 2020-05-22 NOTE — ED Triage Notes (Signed)
Pt to Ed from home with c/o of fatigue, bladder spasms, weakness, and abdominal. Pt has as suprapubic catheter and has hx of UTIs. Pt has been septic in the past from this and feels very similar. Pt had 100 fever yesterday but has been taking Motrin. EMS Pt vitals Hr 130, 90/60, RR 28, CBG 331 94% RA. 98.3 Temporal

## 2020-05-22 NOTE — ED Notes (Signed)
CRITICAL result Lactic Acid 3.1

## 2020-05-22 NOTE — Progress Notes (Signed)
A consult was received from an ED physician for Cefepime per pharmacy dosing.    The patient's profile has been reviewed for ht/wt/allergies/indication/available labs.    A one time order has been placed for Cefepime 2gm IV.    Further antibiotics/pharmacy consults should be ordered by admitting physician if indicated.                       Thank you, Everette Rank, PharmD 05/22/2020  2:05 PM

## 2020-05-22 NOTE — ED Notes (Signed)
PT is Sean Horton, Charlean Sanfilippo is attempting [place] IV

## 2020-05-23 ENCOUNTER — Encounter (HOSPITAL_COMMUNITY): Payer: Self-pay | Admitting: Family Medicine

## 2020-05-23 LAB — COMPREHENSIVE METABOLIC PANEL
ALT: 27 U/L (ref 0–44)
AST: 23 U/L (ref 15–41)
Albumin: 2.8 g/dL — ABNORMAL LOW (ref 3.5–5.0)
Alkaline Phosphatase: 173 U/L — ABNORMAL HIGH (ref 38–126)
Anion gap: 15 (ref 5–15)
BUN: 20 mg/dL (ref 6–20)
CO2: 21 mmol/L — ABNORMAL LOW (ref 22–32)
Calcium: 8.9 mg/dL (ref 8.9–10.3)
Chloride: 101 mmol/L (ref 98–111)
Creatinine, Ser: 1.07 mg/dL (ref 0.61–1.24)
GFR calc Af Amer: >60 mL/min (ref >60)
GFR calc non Af Amer: >60 mL/min (ref >60)
Glucose, Bld: 232 mg/dL — ABNORMAL HIGH (ref 70–99)
Potassium: 4.8 mmol/L (ref 3.5–5.1)
Sodium: 137 mmol/L (ref 135–145)
Total Bilirubin: 2.7 mg/dL — ABNORMAL HIGH (ref 0.3–1.2)
Total Protein: 8.6 g/dL — ABNORMAL HIGH (ref 6.5–8.1)

## 2020-05-23 LAB — CBC
HCT: 36.7 % — ABNORMAL LOW (ref 39.0–52.0)
Hemoglobin: 12.1 g/dL — ABNORMAL LOW (ref 13.0–17.0)
MCH: 26.4 pg (ref 26.0–34.0)
MCHC: 33 g/dL (ref 30.0–36.0)
MCV: 80 fL (ref 80.0–100.0)
Platelets: 408 10*3/uL — ABNORMAL HIGH (ref 150–400)
RBC: 4.59 MIL/uL (ref 4.22–5.81)
RDW: 14.8 % (ref 11.5–15.5)
WBC: 15.8 10*3/uL — ABNORMAL HIGH (ref 4.0–10.5)
nRBC: 0 % (ref 0.0–0.2)

## 2020-05-23 LAB — HEMOGLOBIN A1C
Hgb A1c MFr Bld: 6 % — ABNORMAL HIGH (ref 4.8–5.6)
Mean Plasma Glucose: 125.5 mg/dL

## 2020-05-23 LAB — GLUCOSE, CAPILLARY
Glucose-Capillary: 195 mg/dL — ABNORMAL HIGH (ref 70–99)
Glucose-Capillary: 238 mg/dL — ABNORMAL HIGH (ref 70–99)
Glucose-Capillary: 119 mg/dL — ABNORMAL HIGH (ref 70–99)
Glucose-Capillary: 143 mg/dL — ABNORMAL HIGH (ref 70–99)

## 2020-05-23 LAB — BILIRUBIN, FRACTIONATED(TOT/DIR/INDIR)
Bilirubin, Direct: 0.5 mg/dL — ABNORMAL HIGH (ref 0.0–0.2)
Indirect Bilirubin: 1.3 mg/dL — ABNORMAL HIGH (ref 0.3–0.9)
Total Bilirubin: 1.8 mg/dL — ABNORMAL HIGH (ref 0.3–1.2)

## 2020-05-23 LAB — LACTIC ACID, PLASMA
Lactic Acid, Venous: 1.7 mmol/L (ref 0.5–1.9)
Lactic Acid, Venous: 1.8 mmol/L (ref 0.5–1.9)

## 2020-05-23 MED ORDER — SODIUM CHLORIDE 0.9 % IV BOLUS
1000.0000 mL | Freq: Once | INTRAVENOUS | Status: AC
  Administered 2020-05-23: 1000 mL via INTRAVENOUS

## 2020-05-23 MED ORDER — PIPERACILLIN-TAZOBACTAM 3.375 G IVPB
3.3750 g | Freq: Three times a day (TID) | INTRAVENOUS | Status: DC
  Administered 2020-05-23 – 2020-05-28 (×15): 3.375 g via INTRAVENOUS
  Filled 2020-05-23 (×15): qty 50

## 2020-05-23 MED ORDER — ACETAMINOPHEN 325 MG PO TABS
650.0000 mg | ORAL_TABLET | Freq: Four times a day (QID) | ORAL | Status: DC | PRN
  Administered 2020-05-23 – 2020-05-24 (×3): 650 mg via ORAL
  Filled 2020-05-23 (×3): qty 2

## 2020-05-23 MED ORDER — ACETAMINOPHEN 325 MG PO TABS
650.0000 mg | ORAL_TABLET | Freq: Once | ORAL | Status: AC
  Administered 2020-05-23: 650 mg via ORAL
  Filled 2020-05-23: qty 2

## 2020-05-23 NOTE — Progress Notes (Signed)
Patient is now having vitals obtain q2 hours x2 then q4 hours x2 per yellow mews protocol. Protocol has restarted and notified techs of change. Call bell within reach.

## 2020-05-23 NOTE — Progress Notes (Signed)
   05/23/20 1233  Assess: MEWS Score  Temp (!) 101.7 F (38.7 C)  BP (!) 77/59  Pulse Rate (!) 104  Resp 20  Level of Consciousness Alert  SpO2 95 %  O2 Device Room Air  Assess: if the MEWS score is Yellow or Red  Were vital signs taken at a resting state? Yes  Focused Assessment Documented focused assessment  Early Detection of Sepsis Score *See Row Information* Low  MEWS guidelines implemented *See Row Information* No, vital signs rechecked  Treat  MEWS Interventions Other (Comment) (notified dr. Neysa Bonito )  Notify: Charge Nurse/RN  Name of Charge Nurse/RN Notified Alphonzo Lemmings  Date Charge Nurse/RN Notified 05/23/20  Time Charge Nurse/RN Notified 1245  Notify: Provider  Provider Name/Title Marva Panda  Date Provider Notified 05/23/20  Time Provider Notified 1235  Notification Type Page  Notification Reason Other (Comment) (red mews; pt responsive and alert)  Response See new orders  Date of Provider Response 05/23/20  Time of Provider Response 1236  Document  Patient Outcome Stabilized after interventions  Progress note created (see row info) Yes

## 2020-05-23 NOTE — Progress Notes (Signed)
Pharmacy Antibiotic Note  Sean Horton is a 34 y.o. male admitted on 05/22/2020 with UTI with sepsis.  PMH significant for paraplegia, neurogenic bladder, chronic suprapubic catheter.  Pharmacy has been consulted for Cefepime dosing.  6/27: patient continues to be febrile (Tmax 101.7 so Cefepime d/c'ed and pharmacy asked to dose Zosyn for sepsis  Plan: D/c Cefepime Zosyn 3.375gm IV q8h (each dose infused over 4 hours) F/U cultures and sensitivities  Height: 6\' 5"  (195.6 cm) Weight: 104 kg (229 lb 4.5 oz) IBW/kg (Calculated) : 89.1  Temp (24hrs), Avg:100 F (37.8 C), Min:98.4 F (36.9 C), Max:101.7 F (38.7 C)  Recent Labs  Lab 05/22/20 1351 05/22/20 1545 05/22/20 2307 05/23/20 0126  WBC 16.4*  --   --  15.8*  CREATININE 1.37*  --   --  1.07  LATICACIDVEN 3.1* 2.2* 1.7 1.8    Estimated Creatinine Clearance: 122.6 mL/min (by C-G formula based on SCr of 1.07 mg/dL).    No Known Allergies  Antimicrobials this admission: 6/26 Cefepime >>  6/27 6/27 Zosyn >>   Dose adjustments this admission:   Microbiology results: 6/26 BCx:  NGTD 6/26 UCx:  NGTD  6/26 Covid 19:  negative   Thank you for allowing pharmacy to be a part of this patient's care.  Everette Rank, PharmD 05/23/2020 5:00 PM

## 2020-05-23 NOTE — Progress Notes (Signed)
Pt c/o pain on his lower back . Administered PRN percocet , pt said percocet is not helping for his pain. Paged on call X.blount. No additional medicine order at this time.
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
## 2020-05-24 ENCOUNTER — Encounter (HOSPITAL_COMMUNITY): Payer: Self-pay

## 2020-05-24 DIAGNOSIS — L89309 Pressure ulcer of unspecified buttock, unspecified stage: Secondary | ICD-10-CM

## 2020-05-24 LAB — URINE CULTURE

## 2020-05-24 LAB — CBC
HCT: 28.8 % — ABNORMAL LOW (ref 39.0–52.0)
Hemoglobin: 9.2 g/dL — ABNORMAL LOW (ref 13.0–17.0)
MCH: 26.2 pg (ref 26.0–34.0)
MCHC: 31.9 g/dL (ref 30.0–36.0)
MCV: 82.1 fL (ref 80.0–100.0)
Platelets: 387 10*3/uL (ref 150–400)
RBC: 3.51 MIL/uL — ABNORMAL LOW (ref 4.22–5.81)
RDW: 15.1 % (ref 11.5–15.5)
WBC: 12.8 10*3/uL — ABNORMAL HIGH (ref 4.0–10.5)
nRBC: 0 % (ref 0.0–0.2)

## 2020-05-24 LAB — COMPREHENSIVE METABOLIC PANEL
ALT: 26 U/L (ref 0–44)
AST: 26 U/L (ref 15–41)
Albumin: 2.4 g/dL — ABNORMAL LOW (ref 3.5–5.0)
Alkaline Phosphatase: 131 U/L — ABNORMAL HIGH (ref 38–126)
Anion gap: 8 (ref 5–15)
BUN: 20 mg/dL (ref 6–20)
CO2: 22 mmol/L (ref 22–32)
Calcium: 8.5 mg/dL — ABNORMAL LOW (ref 8.9–10.3)
Chloride: 109 mmol/L (ref 98–111)
Creatinine, Ser: 1.18 mg/dL (ref 0.61–1.24)
GFR calc Af Amer: >60 mL/min (ref >60)
GFR calc non Af Amer: >60 mL/min (ref >60)
Glucose, Bld: 110 mg/dL — ABNORMAL HIGH (ref 70–99)
Potassium: 4.1 mmol/L (ref 3.5–5.1)
Sodium: 139 mmol/L (ref 135–145)
Total Bilirubin: 1.4 mg/dL — ABNORMAL HIGH (ref 0.3–1.2)
Total Protein: 7.2 g/dL (ref 6.5–8.1)

## 2020-05-24 LAB — GLUCOSE, CAPILLARY
Glucose-Capillary: 152 mg/dL — ABNORMAL HIGH (ref 70–99)
Glucose-Capillary: 170 mg/dL — ABNORMAL HIGH (ref 70–99)
Glucose-Capillary: 163 mg/dL — ABNORMAL HIGH (ref 70–99)
Glucose-Capillary: 184 mg/dL — ABNORMAL HIGH (ref 70–99)

## 2020-05-24 LAB — GAMMA GT: GGT: 65 U/L — ABNORMAL HIGH (ref 7–50)

## 2020-05-24 MED ORDER — CAPSAICIN 0.025 % EX CREA
TOPICAL_CREAM | Freq: Two times a day (BID) | CUTANEOUS | Status: DC
  Filled 2020-05-24: qty 60

## 2020-05-24 MED ORDER — MORPHINE SULFATE (PF) 2 MG/ML IV SOLN
1.0000 mg | INTRAVENOUS | Status: DC | PRN
  Administered 2020-05-24 – 2020-05-26 (×3): 1 mg via INTRAVENOUS
  Filled 2020-05-24 (×3): qty 1

## 2020-05-24 MED ORDER — ACETAMINOPHEN 325 MG PO TABS
650.0000 mg | ORAL_TABLET | Freq: Four times a day (QID) | ORAL | Status: DC | PRN
  Administered 2020-05-24 – 2020-05-25 (×2): 650 mg via ORAL
  Filled 2020-05-24 (×3): qty 2

## 2020-05-24 NOTE — Progress Notes (Signed)
Pt was on yellow MEWS protocol. Pt's MEWS turned to RED MEWS from yellow MEWS. Placed pt on RED mews protocol. Oncall notified and Rapid Response RN notified. PRN Tylenol administered . Now Pt is on Green MEWS. Will continue to monitor.
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
## 2020-05-25 DIAGNOSIS — R17 Unspecified jaundice: Secondary | ICD-10-CM

## 2020-05-25 DIAGNOSIS — L89000 Pressure ulcer of unspecified elbow, unstageable: Secondary | ICD-10-CM

## 2020-05-25 DIAGNOSIS — G894 Chronic pain syndrome: Secondary | ICD-10-CM

## 2020-05-25 DIAGNOSIS — A419 Sepsis, unspecified organism: Secondary | ICD-10-CM

## 2020-05-25 DIAGNOSIS — N319 Neuromuscular dysfunction of bladder, unspecified: Secondary | ICD-10-CM

## 2020-05-25 LAB — CBC
HCT: 30 % — ABNORMAL LOW (ref 39.0–52.0)
Hemoglobin: 9.7 g/dL — ABNORMAL LOW (ref 13.0–17.0)
MCH: 26.1 pg (ref 26.0–34.0)
MCHC: 32.3 g/dL (ref 30.0–36.0)
MCV: 80.9 fL (ref 80.0–100.0)
Platelets: 489 10*3/uL — ABNORMAL HIGH (ref 150–400)
RBC: 3.71 MIL/uL — ABNORMAL LOW (ref 4.22–5.81)
RDW: 15.2 % (ref 11.5–15.5)
WBC: 16 10*3/uL — ABNORMAL HIGH (ref 4.0–10.5)
nRBC: 0 % (ref 0.0–0.2)

## 2020-05-25 LAB — BASIC METABOLIC PANEL
Anion gap: 12 (ref 5–15)
BUN: 17 mg/dL (ref 6–20)
CO2: 20 mmol/L — ABNORMAL LOW (ref 22–32)
Calcium: 8.7 mg/dL — ABNORMAL LOW (ref 8.9–10.3)
Chloride: 105 mmol/L (ref 98–111)
Creatinine, Ser: 1.06 mg/dL (ref 0.61–1.24)
GFR calc Af Amer: >60 mL/min (ref >60)
GFR calc non Af Amer: >60 mL/min (ref >60)
Glucose, Bld: 220 mg/dL — ABNORMAL HIGH (ref 70–99)
Potassium: 4.3 mmol/L (ref 3.5–5.1)
Sodium: 137 mmol/L (ref 135–145)

## 2020-05-25 LAB — DIFFERENTIAL
Abs Immature Granulocytes: 0.47 10*3/uL — ABNORMAL HIGH (ref 0.00–0.07)
Basophils Absolute: 0.1 10*3/uL (ref 0.0–0.1)
Basophils Relative: 0 %
Eosinophils Absolute: 0.2 10*3/uL (ref 0.0–0.5)
Eosinophils Relative: 1 %
Immature Granulocytes: 3 %
Lymphocytes Relative: 17 %
Lymphs Abs: 2.7 10*3/uL (ref 0.7–4.0)
Monocytes Absolute: 1.8 10*3/uL — ABNORMAL HIGH (ref 0.1–1.0)
Monocytes Relative: 12 %
Neutro Abs: 10.8 10*3/uL — ABNORMAL HIGH (ref 1.7–7.7)
Neutrophils Relative %: 67 %

## 2020-05-25 LAB — URINALYSIS, ROUTINE W REFLEX MICROSCOPIC
Bilirubin Urine: NEGATIVE
Glucose, UA: NEGATIVE mg/dL
Ketones, ur: NEGATIVE mg/dL
Nitrite: NEGATIVE
Protein, ur: NEGATIVE mg/dL
Specific Gravity, Urine: 1.004 — ABNORMAL LOW (ref 1.005–1.030)
WBC, UA: >50 WBC/hpf — ABNORMAL HIGH (ref 0–5)
pH: 7 (ref 5.0–8.0)

## 2020-05-25 LAB — GLUCOSE, CAPILLARY
Glucose-Capillary: 169 mg/dL — ABNORMAL HIGH (ref 70–99)
Glucose-Capillary: 184 mg/dL — ABNORMAL HIGH (ref 70–99)
Glucose-Capillary: 133 mg/dL — ABNORMAL HIGH (ref 70–99)
Glucose-Capillary: 147 mg/dL — ABNORMAL HIGH (ref 70–99)
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
## 2020-05-25 NOTE — Telephone Encounter (Signed)
Attempted to contact patient to advise of the preferred need to come in. However, I was no able to reach him. Will forward to team to keep trying.
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
## 2020-05-25 NOTE — Telephone Encounter (Signed)
Spoke with pt he is currently at [facility] receiving antibiotics for an infection. He went to the hospital Saturday and is still there as of today. That is what he was wanting to get in touch with Dr. Susa Simmonds about. To see if he could get some antibiotics because he was not feeling well. Sean Horton, CMA

## 2020-05-26 ENCOUNTER — Inpatient Hospital Stay (HOSPITAL_COMMUNITY): Payer: Medicaid Other

## 2020-05-26 ENCOUNTER — Encounter (HOSPITAL_COMMUNITY): Payer: Self-pay | Admitting: *Deleted

## 2020-05-26 DIAGNOSIS — N179 Acute kidney failure, unspecified: Secondary | ICD-10-CM

## 2020-05-26 LAB — COMPREHENSIVE METABOLIC PANEL
ALT: 37 U/L (ref 0–44)
AST: 46 U/L — ABNORMAL HIGH (ref 15–41)
Albumin: 2.5 g/dL — ABNORMAL LOW (ref 3.5–5.0)
Alkaline Phosphatase: 200 U/L — ABNORMAL HIGH (ref 38–126)
Anion gap: 11 (ref 5–15)
BUN: 12 mg/dL (ref 6–20)
CO2: 25 mmol/L (ref 22–32)
Calcium: 9.1 mg/dL (ref 8.9–10.3)
Chloride: 103 mmol/L (ref 98–111)
Creatinine, Ser: 0.89 mg/dL (ref 0.61–1.24)
GFR calc Af Amer: >60 mL/min (ref >60)
GFR calc non Af Amer: >60 mL/min (ref >60)
Glucose, Bld: 130 mg/dL — ABNORMAL HIGH (ref 70–99)
Potassium: 4.3 mmol/L (ref 3.5–5.1)
Sodium: 139 mmol/L (ref 135–145)
Total Bilirubin: 1.2 mg/dL (ref 0.3–1.2)
Total Protein: 8.3 g/dL — ABNORMAL HIGH (ref 6.5–8.1)

## 2020-05-26 LAB — CBC
HCT: 31 % — ABNORMAL LOW (ref 39.0–52.0)
Hemoglobin: 9.8 g/dL — ABNORMAL LOW (ref 13.0–17.0)
MCH: 25.9 pg — ABNORMAL LOW (ref 26.0–34.0)
MCHC: 31.6 g/dL (ref 30.0–36.0)
MCV: 81.8 fL (ref 80.0–100.0)
Platelets: 547 10*3/uL — ABNORMAL HIGH (ref 150–400)
RBC: 3.79 MIL/uL — ABNORMAL LOW (ref 4.22–5.81)
RDW: 15.3 % (ref 11.5–15.5)
WBC: 16.6 10*3/uL — ABNORMAL HIGH (ref 4.0–10.5)
nRBC: 0 % (ref 0.0–0.2)

## 2020-05-26 LAB — KAPPA/LAMBDA LIGHT CHAINS
Kappa free light chain: 62.3 mg/L — ABNORMAL HIGH (ref 3.3–19.4)
Kappa, lambda light chain ratio: 1.17 (ref 0.26–1.65)
Lambda free light chains: 53.3 mg/L — ABNORMAL HIGH (ref 5.7–26.3)

## 2020-05-26 LAB — GLUCOSE, CAPILLARY
Glucose-Capillary: 145 mg/dL — ABNORMAL HIGH (ref 70–99)
Glucose-Capillary: 178 mg/dL — ABNORMAL HIGH (ref 70–99)
Glucose-Capillary: 134 mg/dL — ABNORMAL HIGH (ref 70–99)
Glucose-Capillary: 89 mg/dL (ref 70–99)

## 2020-05-26 MED ORDER — OXYCODONE HCL 5 MG PO TABS
5.0000 mg | ORAL_TABLET | ORAL | Status: DC | PRN
  Administered 2020-05-29: 5 mg via ORAL
  Filled 2020-05-26: qty 1

## 2020-05-26 MED ORDER — HYDROMORPHONE HCL 1 MG/ML IJ SOLN
1.0000 mg | INTRAMUSCULAR | Status: DC | PRN
  Administered 2020-05-26 – 2020-05-29 (×15): 1 mg via INTRAVENOUS
  Filled 2020-05-26 (×15): qty 1

## 2020-05-26 MED ORDER — ENSURE ENLIVE PO LIQD
237.0000 mL | Freq: Two times a day (BID) | ORAL | Status: DC
  Administered 2020-05-26 – 2020-05-29 (×4): 237 mL via ORAL

## 2020-05-26 MED ORDER — ACETAMINOPHEN 325 MG PO TABS
650.0000 mg | ORAL_TABLET | Freq: Four times a day (QID) | ORAL | Status: DC
  Administered 2020-05-26 – 2020-05-29 (×8): 650 mg via ORAL
  Filled 2020-05-26 (×10): qty 2

## 2020-05-26 MED ORDER — DEXTROSE IN LACTATED RINGERS 5 % IV SOLN: INTRAVENOUS | Status: DC

## 2020-05-26 MED ORDER — ADULT MULTIVITAMIN W/MINERALS CH
1.0000 | ORAL_TABLET | Freq: Every day | ORAL | Status: DC
  Administered 2020-05-26 – 2020-05-29 (×4): 1 via ORAL
  Filled 2020-05-26 (×4): qty 1

## 2020-05-26 MED ORDER — IBUPROFEN 400 MG PO TABS
400.0000 mg | ORAL_TABLET | Freq: Once | ORAL | Status: AC
  Administered 2020-05-26: 400 mg via ORAL
  Filled 2020-05-26: qty 1

## 2020-05-26 MED ORDER — IBUPROFEN 400 MG PO TABS
400.0000 mg | ORAL_TABLET | Freq: Three times a day (TID) | ORAL | Status: DC
  Administered 2020-05-26 – 2020-05-29 (×9): 400 mg via ORAL
  Filled 2020-05-26 (×9): qty 1

## 2020-05-26 MED ORDER — PRO-STAT SUGAR FREE PO LIQD
30.0000 mL | Freq: Two times a day (BID) | ORAL | Status: DC
  Administered 2020-05-26 – 2020-05-28 (×3): 30 mL via ORAL
  Filled 2020-05-26 (×6): qty 30

## 2020-05-26 NOTE — Progress Notes (Signed)
   05/26/20 2228  MEWS Score  MEWS Temp 0  MEWS Systolic 0  MEWS Pulse 0  MEWS RR 0  MEWS LOC 0  MEWS Score 0  MEWS Score Color Sean Horton
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
## 2020-05-26 NOTE — Progress Notes (Signed)
   05/26/20 2228  Provider Notification  Provider Name/Title M. Sharlet Salina  Date Provider Notified 05/26/20  Time Provider Notified 2254  Notification Type Page  Notification Reason Other (Comment) (yellow mews pulse 117, rechecked apically 94)  Response No new orders  Rapid Response Notification  Name of Rapid Response RN Notified Alexix RN/RRN  Date Rapid Response Notified 05/26/20  Time Rapid Response Notified 2301

## 2020-05-26 NOTE — Plan of Care (Signed)
  Problem: Nutrition: Goal: Adequate nutrition will be maintained Outcome: Progressing   Problem: Coping: Goal: Level of anxiety will decrease Outcome: Progressing   Problem: Elimination: Goal: Will not experience complications related to urinary retention Outcome: Progressing   Problem: Pain Managment: Goal: General experience of comfort will improve Outcome: Progressing   
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
## 2020-05-27 ENCOUNTER — Telehealth: Payer: Medicaid Other | Admitting: Family Medicine

## 2020-05-27 DIAGNOSIS — Z978 Presence of other specified devices: Secondary | ICD-10-CM

## 2020-05-27 DIAGNOSIS — G8220 Paraplegia, unspecified: Secondary | ICD-10-CM

## 2020-05-27 LAB — CBC WITH DIFFERENTIAL/PLATELET
Abs Immature Granulocytes: 0.42 10*3/uL — ABNORMAL HIGH (ref 0.00–0.07)
Basophils Absolute: 0.1 10*3/uL (ref 0.0–0.1)
Basophils Relative: 0 %
Eosinophils Absolute: 0.4 10*3/uL (ref 0.0–0.5)
Eosinophils Relative: 3 %
HCT: 24.1 % — ABNORMAL LOW (ref 39.0–52.0)
Hemoglobin: 7.5 g/dL — ABNORMAL LOW (ref 13.0–17.0)
Immature Granulocytes: 3 %
Lymphocytes Relative: 24 %
Lymphs Abs: 3.4 10*3/uL (ref 0.7–4.0)
MCH: 26 pg (ref 26.0–34.0)
MCHC: 31.1 g/dL (ref 30.0–36.0)
MCV: 83.7 fL (ref 80.0–100.0)
Monocytes Absolute: 1.3 10*3/uL — ABNORMAL HIGH (ref 0.1–1.0)
Monocytes Relative: 9 %
Neutro Abs: 8.6 10*3/uL — ABNORMAL HIGH (ref 1.7–7.7)
Neutrophils Relative %: 61 %
Platelets: 534 10*3/uL — ABNORMAL HIGH (ref 150–400)
RBC: 2.88 MIL/uL — ABNORMAL LOW (ref 4.22–5.81)
RDW: 15.4 % (ref 11.5–15.5)
WBC: 14.1 10*3/uL — ABNORMAL HIGH (ref 4.0–10.5)
nRBC: 0.1 % (ref 0.0–0.2)

## 2020-05-27 LAB — CULTURE, BLOOD (ROUTINE X 2)
Culture: NO GROWTH
Culture: NO GROWTH
Special Requests: ADEQUATE

## 2020-05-27 LAB — BASIC METABOLIC PANEL
Anion gap: 8 (ref 5–15)
BUN: 16 mg/dL (ref 6–20)
CO2: 24 mmol/L (ref 22–32)
Calcium: 8.7 mg/dL — ABNORMAL LOW (ref 8.9–10.3)
Chloride: 105 mmol/L (ref 98–111)
Creatinine, Ser: 1.14 mg/dL (ref 0.61–1.24)
GFR calc Af Amer: >60 mL/min (ref >60)
GFR calc non Af Amer: >60 mL/min (ref >60)
Glucose, Bld: 130 mg/dL — ABNORMAL HIGH (ref 70–99)
Potassium: 3.8 mmol/L (ref 3.5–5.1)
Sodium: 137 mmol/L (ref 135–145)

## 2020-05-27 LAB — GLUCOSE, CAPILLARY
Glucose-Capillary: 124 mg/dL — ABNORMAL HIGH (ref 70–99)
Glucose-Capillary: 159 mg/dL — ABNORMAL HIGH (ref 70–99)
Glucose-Capillary: 84 mg/dL (ref 70–99)
Glucose-Capillary: 120 mg/dL — ABNORMAL HIGH (ref 70–99)

## 2020-05-27 MED ORDER — PREGABALIN 300 MG PO CAPS
300.0000 mg | ORAL_CAPSULE | Freq: Two times a day (BID) | ORAL | Status: DC
  Administered 2020-05-27 – 2020-05-29 (×5): 300 mg via ORAL
  Filled 2020-05-27: qty 1

## 2020-05-27 NOTE — Progress Notes (Signed)
Pharmacy Antibiotic Note  Sean Horton is a 34 y.o. male admitted on 05/22/2020 with UTI with sepsis.  PMH significant for paraplegia, neurogenic bladder, chronic suprapubic catheter.  On 05/23/20 Pharmacy was consulted for Zosyn dosing.  ID noted if decompensates clinically then would change to carbapenem.    Local wound care being continued for right ankle pressure ulcer stage 2.  Plan: Continue Zosyn 3.375gm IV q8h (each dose infused over 4 hours) Monitor clinical progress and renal function F/U C&S, abx deescalation / LOT   Height: 6\' 5"  (195.6 cm) Weight: 104 kg (229 lb 4.5 oz) IBW/kg (Calculated) : 89.1  Temp (24hrs), Avg:99.9 F (37.7 C), Min:98.7 F (37.1 C), Max:100.6 F (38.1 C)  Recent Labs  Lab 05/22/20 1351 05/22/20 1351 05/22/20 1545 05/22/20 2307 05/23/20 0126 05/24/20 0542 05/25/20 0521 05/26/20 0500 05/27/20 0459  WBC 16.4*   < >  --   --  15.8* 12.8* 16.0* 16.6* 14.1*  CREATININE 1.37*   < >  --   --  1.07 1.18 1.06 0.89 1.14  LATICACIDVEN 3.1*  --  2.2* 1.7 1.8  --   --   --   --    < > = values in this interval not displayed.    Estimated Creatinine Clearance: 115.1 mL/min (by C-G formula based on SCr of 1.14 mg/dL).    No Known Allergies  Antimicrobials this admission: 6/26 Cefepime >>  6/27 6/27 Zosyn >>   Dose adjustments this admission:   Microbiology results: 6/29 BCx: NGTD 6/26 BCx:  NGTD 6/26 UCx:  Multiple species present, suggest recollection  6/26 Covid 19:  negative   Thank you for allowing pharmacy to be a part of this patient's care.  Efraim Kaufmann, PharmD, BCPS 05/27/2020 11:48 AM

## 2020-05-27 NOTE — Progress Notes (Signed)
Dr. Riccardo Dubin notified regarding patient right foot heel blister had popped last night.
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
## 2020-05-28 DIAGNOSIS — D72829 Elevated white blood cell count, unspecified: Secondary | ICD-10-CM

## 2020-05-28 DIAGNOSIS — G8929 Other chronic pain: Secondary | ICD-10-CM

## 2020-05-28 DIAGNOSIS — N390 Urinary tract infection, site not specified: Secondary | ICD-10-CM

## 2020-05-28 LAB — GLUCOSE, CAPILLARY
Glucose-Capillary: 150 mg/dL — ABNORMAL HIGH (ref 70–99)
Glucose-Capillary: 127 mg/dL — ABNORMAL HIGH (ref 70–99)
Glucose-Capillary: 127 mg/dL — ABNORMAL HIGH (ref 70–99)
Glucose-Capillary: 119 mg/dL — ABNORMAL HIGH (ref 70–99)

## 2020-05-28 LAB — CBC WITH DIFFERENTIAL/PLATELET
Abs Immature Granulocytes: 0.51 10*3/uL — ABNORMAL HIGH (ref 0.00–0.07)
Basophils Absolute: 0.1 10*3/uL (ref 0.0–0.1)
Basophils Relative: 1 %
Eosinophils Absolute: 0.5 10*3/uL (ref 0.0–0.5)
Eosinophils Relative: 3 %
HCT: 26.8 % — ABNORMAL LOW (ref 39.0–52.0)
Hemoglobin: 8.1 g/dL — ABNORMAL LOW (ref 13.0–17.0)
Immature Granulocytes: 4 %
Lymphocytes Relative: 21 %
Lymphs Abs: 2.8 10*3/uL (ref 0.7–4.0)
MCH: 25.5 pg — ABNORMAL LOW (ref 26.0–34.0)
MCHC: 30.2 g/dL (ref 30.0–36.0)
MCV: 84.3 fL (ref 80.0–100.0)
Monocytes Absolute: 1 10*3/uL (ref 0.1–1.0)
Monocytes Relative: 7 %
Neutro Abs: 8.7 10*3/uL — ABNORMAL HIGH (ref 1.7–7.7)
Neutrophils Relative %: 64 %
Platelets: 678 10*3/uL — ABNORMAL HIGH (ref 150–400)
RBC: 3.18 MIL/uL — ABNORMAL LOW (ref 4.22–5.81)
RDW: 15.3 % (ref 11.5–15.5)
WBC: 13.4 10*3/uL — ABNORMAL HIGH (ref 4.0–10.5)
nRBC: 0.3 % — ABNORMAL HIGH (ref 0.0–0.2)

## 2020-05-28 LAB — BASIC METABOLIC PANEL
Anion gap: 10 (ref 5–15)
BUN: 16 mg/dL (ref 6–20)
CO2: 24 mmol/L (ref 22–32)
Calcium: 8.6 mg/dL — ABNORMAL LOW (ref 8.9–10.3)
Chloride: 105 mmol/L (ref 98–111)
Creatinine, Ser: 0.91 mg/dL (ref 0.61–1.24)
GFR calc Af Amer: >60 mL/min (ref >60)
GFR calc non Af Amer: >60 mL/min (ref >60)
Glucose, Bld: 120 mg/dL — ABNORMAL HIGH (ref 70–99)
Potassium: 3.9 mmol/L (ref 3.5–5.1)
Sodium: 139 mmol/L (ref 135–145)

## 2020-05-28 LAB — MULTIPLE MYELOMA PANEL, SERUM
Albumin SerPl Elph-Mcnc: 2.3 g/dL — ABNORMAL LOW (ref 2.9–4.4)
Albumin/Glob SerPl: 0.5 — ABNORMAL LOW (ref 0.7–1.7)
Alpha 1: 0.6 g/dL — ABNORMAL HIGH (ref 0.0–0.4)
Alpha2 Glob SerPl Elph-Mcnc: 1.2 g/dL — ABNORMAL HIGH (ref 0.4–1.0)
B-Globulin SerPl Elph-Mcnc: 1.7 g/dL — ABNORMAL HIGH (ref 0.7–1.3)
Gamma Glob SerPl Elph-Mcnc: 1.3 g/dL (ref 0.4–1.8)
Globulin, Total: 4.8 g/dL — ABNORMAL HIGH (ref 2.2–3.9)
IgA: 666 mg/dL — ABNORMAL HIGH (ref 90–386)
IgG (Immunoglobin G), Serum: 1564 mg/dL (ref 603–1613)
IgM (Immunoglobulin M), Srm: 68 mg/dL (ref 20–172)
Total Protein ELP: 7.1 g/dL (ref 6.0–8.5)

## 2020-05-28 MED ORDER — PRO-STAT SUGAR FREE PO LIQD: 30.0000 mL | Freq: Two times a day (BID) | ORAL | 0 refills | Status: AC

## 2020-05-28 MED ORDER — AMOXICILLIN-POT CLAVULANATE 875-125 MG PO TABS: 1.0000 | ORAL_TABLET | Freq: Two times a day (BID) | ORAL | 0 refills | Status: AC

## 2020-05-28 MED ORDER — AMOXICILLIN-POT CLAVULANATE 875-125 MG PO TABS
1.0000 | ORAL_TABLET | Freq: Two times a day (BID) | ORAL | Status: DC
  Administered 2020-05-28 – 2020-05-29 (×3): 1 via ORAL
  Filled 2020-05-28 (×3): qty 1

## 2020-05-28 MED ORDER — ADULT MULTIVITAMIN W/MINERALS CH: 1.0000 | ORAL_TABLET | Freq: Every day | ORAL | 0 refills | Status: AC

## 2020-05-28 NOTE — Progress Notes (Signed)
[facility] for Infectious Disease    Date of Admission:  05/22/2020   Total days of antibiotics 7           ID: Sean Horton is a 34 y.o. male with   Principal Problem:   Sepsis ([place]) Active Problems:   Paraplegia following spinal cord injury ([place])   Neurogenic bladder   Chronic pain   Chronic indwelling Foley catheter   Pressure injury of skin   AKI (acute kidney injury) ([place])   Diabetes ([place])   Total bilirubin, elevated    Subjective: Continues to feel better, remains afebrile closer to 48hrs. His chronic pain is variable some mornings more challenging than others.  Medications:  . acetaminophen  650 mg Oral Q6H  . amoxicillin-clavulanate  1 tablet Oral Q12H  . baclofen  20 mg Oral BID  . buPROPion  100 mg Oral BID  . capsaicin   Topical BID  . DULoxetine  60 mg Oral BID  . enoxaparin (LOVENOX) injection  40 mg Subcutaneous QHS  . feeding supplement (ENSURE ENLIVE)  237 mL Oral BID BM  . feeding supplement (PRO-STAT SUGAR FREE 64)  30 mL Oral BID  . ferrous sulfate  325 mg Oral Daily  . folic acid  1 mg Oral Once per day on Mon Thu  . ibuprofen  400 mg Oral TID  . insulin aspart  0-15 Units Subcutaneous TID WC  . loratadine  10 mg Oral Daily  . multivitamin with minerals  1 tablet Oral Daily  . oxyCODONE  40 mg Oral Q12H  . pantoprazole  40 mg Oral Daily  . pregabalin  300 mg Oral BID    Objective: Vital signs in last 24 hours: Temp:  [98.8 F (37.1 C)-99.2 F (37.3 C)] 98.8 F (37.1 C) (07/02 1316) Pulse Rate:  [88-100] 100 (07/02 1316) Resp:  [17-18] 17 (07/02 1316) BP: (126-158)/(73-109) 138/85 (07/02 1316) SpO2:  [96 %-99 %] 97 % (07/02 1316) Physical Exam  Constitutional: He is oriented to person, place, and time. He appears well-developed and well-nourished. No distress.  HENT:  Mouth/Throat: Oropharynx is clear and moist. No oropharyngeal exudate.  Cardiovascular: Normal rate, regular rhythm and normal heart sounds. Exam reveals no  gallop and no friction rub.  No murmur heard.  Pulmonary/Chest: Effort normal and breath sounds normal. No respiratory distress. He has no wheezes.  Abdominal: Soft. Bowel sounds are normal. He exhibits no distension. There is no tenderness.  Suprapubic catheter+ Neurological: He is alert and oriented to person, place, and time. Wasted extremities from paraplgeia Skin: Skin is warm and dry. No rash noted. No erythema.  Psychiatric: He has a normal mood and affect. His behavior is normal.    Lab Results Recent Labs    05/27/20 0459 05/28/20 0437  WBC 14.1* 13.4*  HGB 7.5* 8.1*  HCT 24.1* 26.8*  NA 137 139  K 3.8 3.9  CL 105 105  CO2 24 24  BUN 16 16  CREATININE 1.14 0.91   Liver Panel Recent Labs    05/26/20 0500  PROT 8.3*  ALBUMIN 2.5*  AST 46*  ALT 37  ALKPHOS 200*  BILITOT 1.2    Microbiology: reviewed Studies/Results: No results found.   Assessment/Plan: Sepsis from urinary source = transition to amox/clav today and plan for 5 more days of therapy. No hx of ESBL. Follow up with pcp.  Chronic pain syndrome 2/2 paraplegia = he sees pain management team to help with finding better regimen.  Recommended to follow up as outpatient   [facility] Cell: [phone] Pager: [phone]  05/28/2020, 3:22 PM

## 2020-05-28 NOTE — Discharge Summary (Signed)
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 DRAWN AEROBIC ONLY Blood Culture results may not be optimal due to an inadequate volume of blood received in culture bottles Performed at [facility] [address] [place], [place] [address]    Culture   Final    NO GROWTH 3 DAYS Performed at [facility], [place] [address] [place], [place] [address]    Report Status PENDING  Incomplete  Culture, blood (routine x 2)     Status: None (Preliminary result)   Collection Time: 05/25/20  8:15 AM   Specimen: BLOOD LEFT HAND  Result Value Ref Range Status   Specimen Description   Final    BLOOD LEFT HAND Performed at [facility] [address], [place], [place] [address]    Special Requests   Final    BOTTLES DRAWN AEROBIC ONLY Blood Culture results may not be optimal due to an inadequate volume of blood received in culture bottles Performed at [facility] [address] [place], [place] [address]    Culture   Final    NO GROWTH 3 DAYS Performed at [facility], [place] [address], [place], [place] [address]    Report Status PENDING  Incomplete     Time coordinating discharge: 45 minutes  SIGNED:   Tawni Millers, MD  [facility] 05/28/2020, 11:09 AM

## 2020-05-29 LAB — CREATININE, SERUM
Creatinine, Ser: 0.84 mg/dL (ref 0.61–1.24)
GFR calc Af Amer: >60 mL/min (ref >60)
GFR calc non Af Amer: >60 mL/min (ref >60)

## 2020-05-29 LAB — GLUCOSE, CAPILLARY
Glucose-Capillary: 111 mg/dL — ABNORMAL HIGH (ref 70–99)
Glucose-Capillary: 141 mg/dL — ABNORMAL HIGH (ref 70–99)

## 2020-05-29 NOTE — Progress Notes (Signed)
Patient has remained afebrile, continue to have chronic back pain.  No nausea and no vomiting.  Vital signs: Temperature 37.3 C, blood pressure 139/83, heart rate 88, respiratory rate 18, oxygen saturation 96% on room air. His lungs are clear to auscultation, heart S1-S2, present rhythmic, abdomen soft muscle extremity edema.  Patient will be discharged home, continue oral antibiotic therapy, follow-up as an outpatient.

## 2020-05-29 NOTE — Progress Notes (Signed)
Discharge instructions given to pt and all questions were answered.  

## 2020-05-30 LAB — CULTURE, BLOOD (ROUTINE X 2)
Culture: NO GROWTH
Culture: NO GROWTH

## 2020-05-31 ENCOUNTER — Other Ambulatory Visit: Payer: Self-pay | Admitting: Family Medicine

## 2020-06-03 ENCOUNTER — Telehealth: Payer: Self-pay | Admitting: *Deleted

## 2020-06-03 DIAGNOSIS — Z748 Other problems related to care provider dependency: Secondary | ICD-10-CM
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
## 2020-06-07 ENCOUNTER — Other Ambulatory Visit: Payer: Self-pay | Admitting: *Deleted

## 2020-06-07 NOTE — Patient Outreach (Addendum)
06/07/2020  Sean Horton [birth year] [id number]  Sean Horton is an 34 y.o. male   Encounter Medications:   Outpatient Encounter Medications as of 06/07/2020  Medication Sig Note   acetaminophen (TYLENOL) 500 MG tablet Take 500-1,000 mg by mouth every 6 (six) hours as needed (for pain.).    Amino Acids-Protein Hydrolys (FEEDING SUPPLEMENT, PRO-STAT SUGAR FREE 64,) LIQD Take 30 mLs by mouth 2 (two) times daily.    bisacodyl (DULCOLAX) 5 MG EC tablet Take 5 mg by mouth daily as needed for moderate constipation.    buPROPion (WELLBUTRIN SR) 100 MG 12 hr tablet TAKE 1 TABLET(100 MG) BY MOUTH TWICE DAILY    Cannabidiol POWD Apply 1 application topically 3 (three) times daily as needed (nerve pain in hands.). CBD Cream    CRANBERRY PO Take 3 tablets by mouth daily. Gummies    DULoxetine (CYMBALTA) 60 MG capsule Take 60 mg by mouth 2 (two) times daily.    FERROUS SULFATE PO Take 1 tablet by mouth daily.    fexofenadine (ALLEGRA) 180 MG tablet Take 180 mg by mouth daily as needed for allergies.     FOLIC ACID PO Take 1 tablet by mouth 2 (two) times a week.     hydrALAZINE (APRESOLINE) 25 MG tablet TAKE 1 TABLET BY MOUTH THREE TIMES DAILY AS NEEDED( SYSTOLIC BLOOD PRESSURE MORE THAN 361 OR DIASTOLIC BLOOD PRESSURE MORE THAN 110) (Patient taking differently: Take 25 mg by mouth 3 (three) times daily as needed (SYSTOLIC BLOOD PRESSURE MORE THAN 443 OR DIASTOLIC BLOOD PRESSURE MORE THAN 110). )    ibuprofen (ADVIL) 600 MG tablet Take 600 mg by mouth every 8 (eight) hours as needed for headache or moderate pain.     lactose free nutrition (BOOST) LIQD Take 237 mLs by mouth 3 (three) times daily as needed (nutrition).  08/11/2019: vanilla   mometasone (NASONEX) 50 MCG/ACT nasal spray Place 2 sprays into the nose daily as needed (allergies).    Multiple Vitamin (MULTIVITAMIN WITH MINERALS) TABS tablet Take 1 tablet by mouth daily.    omeprazole (PRILOSEC) 20 MG capsule Take 20 mg  by mouth daily as needed (indigestion).     OVER THE COUNTER MEDICATION Apply 1 application topically daily as needed (nerve pain in hands). Hemp Cream    oxyCODONE ER (XTAMPZA ER) 36 MG C12A Take 36 mg by mouth every 12 (twelve) hours.     oxyCODONE-acetaminophen (PERCOCET) 10-325 MG tablet Take 1 tablet by mouth every 8 (eight) hours as needed for pain.     pregabalin (LYRICA) 300 MG capsule Take 300 mg by mouth 2 (two) times daily.    PRESCRIPTION MEDICATION Baclofen & Bupivacaine Pump 05/22/2020: Pt is unsure of dosage. Pump is still on patient.   Probiotic Product (PROBIOTIC PO) Take 1 capsule by mouth 2 (two) times a week.     SUMAtriptan (IMITREX) 25 MG tablet Take 1 tablet (25 mg total) by mouth every 2 (two) hours as needed for migraine. May repeat in 2 hours if headache persists or recurs. 09/22/2019: On hand   No facility-administered encounter medications on file as of 06/07/2020.    Goals Addressed   None     SDOH Screenings   Alcohol Screen:    Last Alcohol Screening Score (AUDIT):   Depression (PHQ2-9):    PHQ-2 Score:   Financial Resource Strain:    Difficulty of Paying Living Expenses:   Food Insecurity:    Worried About Charity fundraiser in the Last  Year:    Arboriculturist in the Last Year:   Housing:    Last Housing Risk Score:   Physical Activity:    Days of Exercise per Week:    Minutes of Exercise per Session:   Social Connections:    Frequency of Communication with Friends and Family:    Frequency of Social Gatherings with Friends and Family:    Attends Religious Services:    Active Member of Clubs or Organizations:    Attends Music therapist:    Marital Status:   Stress:    Feeling of Stress :   Tobacco Use: Medium Risk   Smoking Tobacco Use: Former Smoker   Smokeless Tobacco Use: Never Used  Transportation Needs:    Film/video editor (Medical):    Lack of Transportation (Non-Medical):         Assessment:  Referral received from care Psychologist, prison and probation services, however member not eligible for services.    Plan: Will not open case at this time.  [name], [place], MSN [place] [phone]

## 2020-06-08 ENCOUNTER — Ambulatory Visit: Payer: Medicaid Other

## 2020-06-08 NOTE — Chronic Care Management (AMB) (Signed)
  Care Management   Outreach Note  06/08/2020 Name: Sean Horton MRN: [id number] DOB: 05-08-1986  Referred by: [name], DO Reason for referral : Chronic Care Management (Initial Transportation)   An unsuccessful telephone outreach was attempted today. The patient was referred to the case management team for assistance with care management and care coordination.   Follow Up Plan: A HIPPA compliant phone message was left for the patient providing contact information and requesting a return call.  The care management team will reach out to the patient again over the next 7-14 days.   Lazaro Arms RN, BSN, [facility] Care Management Coordinator [place] Phone: [phone] Fax: [phone]

## 2020-06-10 ENCOUNTER — Ambulatory Visit: Payer: Medicaid Other

## 2020-06-10 NOTE — Chronic Care Management (AMB) (Signed)
  Care Management   Outreach Note  06/10/2020 Name: Sean Horton MRN: [id number] DOB: 1985-12-23  Referred by: [name], DO Reason for referral : Chronic Care Management (2nd attempt Transportation)   A second unsuccessful telephone outreach was attempted today. The patient was referred to the case management team for assistance with care management and care coordination.   Follow Up Plan: A HIPPA compliant phone message was left for the patient providing contact information and requesting a return call.  The care management team will reach out to the patient again over the next 5-7 days.   Lazaro Arms RN, BSN, [facility] Care Management Coordinator [place] Phone: [phone] Fax: [phone]

## 2020-06-11 ENCOUNTER — Ambulatory Visit: Payer: Self-pay | Admitting: *Deleted

## 2020-06-15 NOTE — Telephone Encounter (Addendum)
Patient calls nurse line requesting virtual visit to discuss getting new wheelchair. Patient informed that provider prefers in office visit as there are limitations with virtual visit. Ultimately, scheduled patient virtual visit, as wheelchair is falling apart and patient states that he cannot come into office with old chair. Patient states that he will schedule an in office follow up once he receives new chair.   Provider note needs to state the following.    Patient suffers from autonomic dysreflexia which impairs their ability to perform daily activities like bathing, grooming, dressing in the home. A walker will not resolve issue with performing activities of daily living. A wheelchair will allow patient to safely perform daily activities. Patient can safely propel the wheelchair in the home or has a caregiver who can provide assistance. Length of need Lifetime.  Accessories: elevating leg rests (ELRs), wheel locks, extensions and anti-tippers.  Talbot Grumbling, RN

## 2020-06-21 ENCOUNTER — Ambulatory Visit: Payer: Medicaid Other

## 2020-06-21 ENCOUNTER — Other Ambulatory Visit: Payer: Self-pay

## 2020-06-21 DIAGNOSIS — Z789 Other specified health status: Secondary | ICD-10-CM

## 2020-06-21 DIAGNOSIS — G8220 Paraplegia, unspecified: Secondary | ICD-10-CM

## 2020-06-21 NOTE — Patient Instructions (Signed)
Visit Information  Goals Addressed              This Visit's Progress   .  I want to find a new transportation (pt-stated)        [place] (see longitudinal plan of care for additional care plan information)  Current Barriers:  . Care Coordination needs related to Transportation in a patient with paraplegia following spinal cord injury   Nurse Case Manager Clinical Goal(s):  Marland Kitchen Over the next 14 days, patient will work with Care Guides to address needs related to Transportation  Interventions:  . Inter-disciplinary care team collaboration (see longitudinal plan of care) . Reviewed medications with patient and discussed Importance . Discussed plans with patient for ongoing care management follow up and provided patient with direct contact information for care management team . Reviewed scheduled/upcoming provider appointments including: 06/25/20 with Dr Susa Simmonds . Care Guide referral for Transportation  Patient Self Care Activities:  . Patient verbalizes understanding of plan to refer to Care Guides for help with Transportation . Self administers medications as prescribed . Calls pharmacy for medication refills . Calls provider office for new concerns or questions . Unable to independently self navigate system for help with transportation  Initial goal documentation        Sean Horton was given information about Care Management services today including:  1. Care Management services include personalized support from designated clinical staff supervised by his physician, including individualized plan of care and coordination with other care providers 2. 24/7 contact phone numbers for assistance for urgent and routine care needs. 3. The patient may stop CCM services at any time (effective at the end of the month) by phone call to the office staff.  Patient agreed to services and verbal consent obtained.   The patient verbalized understanding of instructions provided today  and declined a print copy of patient instruction materials.   The care management team will reach out to the patient again over the next 14 days.  The patient has been provided with contact information for the care management team and has been advised to call with any health related questions or concerns.   Sean Arms RN, BSN, [facility] Care Management Coordinator [place] Phone: [phone] Fax: [phone]

## 2020-06-21 NOTE — Chronic Care Management (AMB) (Signed)
Care Management   Initial Visit Note  06/21/2020 Name: Sean Horton MRN: [id number] DOB: December 18, 1985   Assessment: Sean Horton is a 34 y.o. year old male who sees [place], [place], DO for primary care. The care management team was consulted for assistance with care management and care coordination needs related to Care Coordination for paraplegia following spinal cord injury.   Review of patient status, including review of consultants reports, relevant laboratory and other test results, and collaboration with appropriate care team members and the patient's provider was performed as part of comprehensive patient evaluation and provision of care management services.    SDOH (Social Determinants of Health) assessments performed: Yes See Care Plan activities for detailed interventions related to [facility])     Outpatient Encounter Medications as of 06/21/2020  Medication Sig Note  . acetaminophen (TYLENOL) 500 MG tablet Take 500-1,000 mg by mouth every 6 (six) hours as needed (for pain.).   Marland Kitchen bisacodyl (DULCOLAX) 5 MG EC tablet Take 5 mg by mouth daily as needed for moderate constipation.   Marland Kitchen buPROPion (WELLBUTRIN SR) 100 MG 12 hr tablet TAKE 1 TABLET(100 MG) BY MOUTH TWICE DAILY   . CRANBERRY PO Take 3 tablets by mouth daily. Gummies   . DULoxetine (CYMBALTA) 60 MG capsule Take 60 mg by mouth 2 (two) times daily.   Marland Kitchen FERROUS SULFATE PO Take 1 tablet by mouth daily.   . fexofenadine (ALLEGRA) 180 MG tablet Take 180 mg by mouth daily as needed for allergies.    Marland Kitchen FOLIC ACID PO Take 1 tablet by mouth 2 (two) times a week.    . hydrALAZINE (APRESOLINE) 25 MG tablet TAKE 1 TABLET BY MOUTH THREE TIMES DAILY AS NEEDED( SYSTOLIC BLOOD PRESSURE MORE THAN 527 OR DIASTOLIC BLOOD PRESSURE MORE THAN 110) (Patient taking differently: Take 25 mg by mouth 3 (three) times daily as needed (SYSTOLIC BLOOD PRESSURE MORE THAN 782 OR DIASTOLIC BLOOD PRESSURE MORE THAN 110). )   . ibuprofen (ADVIL) 600 MG tablet  Take 600 mg by mouth every 8 (eight) hours as needed for headache or moderate pain.    . mometasone (NASONEX) 50 MCG/ACT nasal spray Place 2 sprays into the nose daily as needed (allergies).   . Multiple Vitamin (MULTIVITAMIN WITH MINERALS) TABS tablet Take 1 tablet by mouth daily.   Marland Kitchen omeprazole (PRILOSEC) 20 MG capsule Take 20 mg by mouth daily as needed (indigestion).    Marland Kitchen OVER THE COUNTER MEDICATION Apply 1 application topically daily as needed (nerve pain in hands). Hemp Cream   . oxyCODONE ER (XTAMPZA ER) 36 MG C12A Take 36 mg by mouth every 12 (twelve) hours.    Marland Kitchen oxyCODONE-acetaminophen (PERCOCET) 10-325 MG tablet Take 1 tablet by mouth every 8 (eight) hours as needed for pain.    . pregabalin (LYRICA) 300 MG capsule Take 300 mg by mouth 2 (two) times daily.   Marland Kitchen PRESCRIPTION MEDICATION Baclofen & Bupivacaine Pump 05/22/2020: Pt is unsure of dosage. Pump is still on patient.  . Probiotic Product (PROBIOTIC PO) Take 1 capsule by mouth 2 (two) times a week.    . SUMAtriptan (IMITREX) 25 MG tablet Take 1 tablet (25 mg total) by mouth every 2 (two) hours as needed for migraine. May repeat in 2 hours if headache persists or recurs. 09/22/2019: On hand  . Amino Acids-Protein Hydrolys (FEEDING SUPPLEMENT, PRO-STAT SUGAR FREE 64,) LIQD Take 30 mLs by mouth 2 (two) times daily. (Patient not taking: Reported on 06/21/2020)   . Cannabidiol POWD Apply  1 application topically 3 (three) times daily as needed (nerve pain in hands.). CBD Cream (Patient not taking: Reported on 06/21/2020)   . lactose free nutrition (BOOST) LIQD Take 237 mLs by mouth 3 (three) times daily as needed (nutrition).  (Patient not taking: Reported on 06/21/2020) 06/21/2020: Patient states he stopped drinking when his wounds healed   No facility-administered encounter medications on file as of 06/21/2020.    Goals Addressed              This Visit's Progress   .  I want to find a new transportation (pt-stated)        [place] (see longitudinal plan of care for additional care plan information)  Current Barriers:  . Care Coordination needs related to Transportation in a patient with paraplegia following spinal cord injury   Nurse Case Manager Clinical Goal(s):  Marland Kitchen Over the next 14 days, patient will work with Care Guides to address needs related to Transportation  Interventions:  . Inter-disciplinary care team collaboration (see longitudinal plan of care) . Reviewed medications with patient and discussed Importance . Discussed plans with patient for ongoing care management follow up and provided patient with direct contact information for care management team . Reviewed scheduled/upcoming provider appointments including: 06/25/20 with Dr Susa Simmonds . Care Guide referral for Transportation  Patient Self Care Activities:  . Patient verbalizes understanding of plan to refer to Care Guides for help with Transportation . Self administers medications as prescribed . Calls pharmacy for medication refills . Calls provider office for new concerns or questions . Unable to independently self navigate system for help with transportation  Initial goal documentation         Follow up plan:  The care management team will reach out to the patient again over the next 14 days.  The patient has been provided with contact information for the care management team and has been advised to call with any health related questions or concerns.   Sean Horton was given information about Care Management services today including:  1. Care Management services include personalized support from designated clinical staff supervised by a physician, including individualized plan of care and coordination with other care providers 2. 24/7 contact phone numbers for assistance for urgent and routine care needs. 3. The patient may stop Care Management services at any time (effective at the end of the month) by phone call to the office  staff.  Patient agreed to services and verbal consent obtained.  Lazaro Arms RN, BSN, [facility] Care Management Coordinator [place] Phone: [phone] Fax: [phone]

## 2020-06-25 ENCOUNTER — Telehealth: Payer: Self-pay

## 2020-06-25 ENCOUNTER — Other Ambulatory Visit: Payer: Self-pay

## 2020-06-25 ENCOUNTER — Telehealth (INDEPENDENT_AMBULATORY_CARE_PROVIDER_SITE_OTHER): Payer: Medicaid Other | Admitting: Family Medicine

## 2020-06-25 DIAGNOSIS — G904 Autonomic dysreflexia: Secondary | ICD-10-CM | POA: Diagnosis not present

## 2020-06-25 DIAGNOSIS — G8220 Paraplegia, unspecified: Secondary | ICD-10-CM

## 2020-06-25 DIAGNOSIS — F419 Anxiety disorder, unspecified: Secondary | ICD-10-CM

## 2020-06-25 DIAGNOSIS — F329 Major depressive disorder, single episode, unspecified: Secondary | ICD-10-CM | POA: Diagnosis not present

## 2020-06-25 DIAGNOSIS — F32A Depression, unspecified: Secondary | ICD-10-CM

## 2020-06-25 MED ORDER — BUPROPION HCL ER (SR) 150 MG PO TB12: 150.0000 mg | ORAL_TABLET | Freq: Two times a day (BID) | ORAL | 1 refills | Status: DC

## 2020-06-25 NOTE — Progress Notes (Signed)
    Telemedicine Visit  Patient consented to have virtual visit. Method of visit: Video was attempted, but technology challenges prevented patient from using video, so visit was conducted via telephone.  Encounter participants: Patient: Sean Horton - located at home Provider: [name] - located at [facility] Others (if applicable):   Chief Complaint:  need for wheelchair and follow up    HPI:  Patient states he needs a new manual wheelchair as his current wheelchair is falling apart preventing him to have adequate ability to travel to clinic.  States his DME service agency, [employer], has not approved the manual wheelchair prescription that was placed months ago.  He is unsure why.  States that he did receive his air mattress.   ROS: per HPI  Pertinent PMHx: Paraplegia following spinal cord injury, reflex sympathetic dystrophy, syrinx of the spinal cord, neurogenic bladder, wheelchair-bound, recurrent osteomyelitis, anxiety, depression,  Exam:  Respiratory: Speaking in full sentences without pause  Assessment/Plan:  Paraplegia following spinal cord injury ([place]) Manual wheelchair prescription.  Hopefully the patient will obtain this needed device as he can return to in person visits.   Patient suffers from autonomic dysreflexia which impairs their ability to perform daily activities like bathing, grooming, dressing in the home. A walker will not resolve issue with performing activities of daily living. A wheelchair will allow patient to safely perform daily activities. Patient can safely propel the wheelchair in the home or has a caregiver who can provide assistance. Length of need Lifetime.  Accessories: elevating leg rests (ELRs), wheel locks, extensions and anti-tippers.  Anxiety and depression Patient received no benefit from BuSpar.  Was started on Wellbutrin instead.  Will titrate up Wellbutrin up and continue Cymbalta. -Reassess in 4 weeks -Continue Cymbalta 60 mg twice  daily -Wellbutrin 150 mg twice daily    Time spent during visit with patient: 16 minutes    [name], DO PGY-2, [facility] 06/25/2020

## 2020-06-25 NOTE — Progress Notes (Signed)
Called patient to complete pre-appointment questions and required visit documentation. LVM for patient to return call to office to complete this process.   Talbot Grumbling, RN

## 2020-06-25 NOTE — Telephone Encounter (Signed)
Community message sent to [employer]. Will await response.   Jozlyn Schatz C January Bergthold, RN  

## 2020-06-25 NOTE — Telephone Encounter (Signed)
Please see below message from [employer].   WC order received, thanks    Talbot Grumbling, RN

## 2020-06-28 ENCOUNTER — Encounter: Payer: Self-pay | Admitting: Family Medicine

## 2020-06-28 NOTE — Assessment & Plan Note (Addendum)
Manual wheelchair prescription.  Hopefully the patient will obtain this needed device as he can return to in person visits.   Patient suffers from autonomic dysreflexia which impairs their ability to perform daily activities like bathing, grooming, dressing in the home. A walker will not resolve issue with performing activities of daily living. A wheelchair will allow patient to safely perform daily activities. Patient can safely propel the wheelchair in the home or has a caregiver who can provide assistance. Length of need Lifetime.  Accessories: elevating leg rests (ELRs), wheel locks, extensions and anti-tippers.

## 2020-06-28 NOTE — Assessment & Plan Note (Addendum)
Patient received no benefit from BuSpar.  Was started on Wellbutrin instead.  Will titrate up Wellbutrin up and continue Cymbalta. -Reassess in 4 weeks -Continue Cymbalta 60 mg twice daily -Wellbutrin 150 mg twice daily

## 2020-07-05 ENCOUNTER — Other Ambulatory Visit: Payer: Self-pay

## 2020-07-05 ENCOUNTER — Ambulatory Visit: Payer: Medicaid Other

## 2020-07-05 NOTE — Patient Instructions (Signed)
Visit Information  Goals Addressed              This Visit's Progress   .  I want to find a new transportation (pt-stated)        [place] (see longitudinal plan of care for additional care plan information)  Current Barriers:  . Care Coordination needs related to Transportation in a patient with paraplegia following spinal cord injury   Nurse Case Manager Clinical Goal(s):  Marland Kitchen Over the next 14 days, patient will work with Care Guides to address needs related to Transportation  Interventions:  . Inter-disciplinary care team collaboration (see longitudinal plan of care) . Reviewed medications with patient and discussed Importance . Discussed plans with patient for ongoing care management follow up and provided patient with direct contact information for care management team . Reviewed scheduled/upcoming provider appointments including: 06/25/20 with Dr Susa Simmonds . Care Guide referral for Transportation . 07/05/20 . Spoke with the patient and he states that he is not sure if he received a call from the care guides about the transportation.  He has not checked his voicemail.  He will check and let me know.   Patient Self Care Activities:  . Patient verbalizes understanding of plan to refer to Care Guides for help with Transportation . Self administers medications as prescribed . Calls pharmacy for medication refills . Calls provider office for new concerns or questions . Unable to independently self navigate system for help with transportation  Initial goal documentation        Mr. Schoolfield was given information about Care Management services today including:  1. Care Management services include personalized support from designated clinical staff supervised by his physician, including individualized plan of care and coordination with other care providers 2. 24/7 contact phone numbers for assistance for urgent and routine care needs. 3. The patient may stop CCM services at any  time (effective at the end of the month) by phone call to the office staff.  Patient agreed to services and verbal consent obtained.   The patient verbalized understanding of instructions provided today and declined a print copy of patient instruction materials.   The care management team will reach out to the patient again over the next 14 days.   Lazaro Arms RN, BSN, [facility] Care Management Coordinator [place] Phone: [phone] Fax: [phone]

## 2020-07-05 NOTE — Chronic Care Management (AMB) (Signed)
  Care Management   Follow Up Note   07/05/2020 Name: Sean Horton MRN: [id number] DOB: Nov 01, 1986  Referred by: [name], DO Reason for referral : Appointment (Transportation F/U)   Sean Horton is a 34 y.o. year old male who is a primary care patient of [name], DO. The care management team was consulted for assistance with care management and care coordination needs.    Review of patient status, including review of consultants reports, relevant laboratory and other test results, and collaboration with appropriate care team members and the patient's provider was performed as part of comprehensive patient evaluation and provision of chronic care management services.    SDOH (Social Determinants of Health) assessments performed: No See Care Plan activities for detailed interventions related to [facility])     Advanced Directives: See Care Plan and Vynca application for related entries.   Goals Addressed              This Visit's Progress   .  I want to find a new transportation (pt-stated)        [place] (see longitudinal plan of care for additional care plan information)  Current Barriers:  . Care Coordination needs related to Transportation in a patient with paraplegia following spinal cord injury   Nurse Case Manager Clinical Goal(s):  Marland Kitchen Over the next 14 days, patient will work with Care Guides to address needs related to Transportation  Interventions:  . Inter-disciplinary care team collaboration (see longitudinal plan of care) . Reviewed medications with patient and discussed Importance . Discussed plans with patient for ongoing care management follow up and provided patient with direct contact information for care management team . Reviewed scheduled/upcoming provider appointments including: 06/25/20 with Dr Susa Simmonds . Care Guide referral for Transportation . 07/05/20 . Spoke with the patient and he states that he is not sure if he received a  call from the care guides about the transportation.  He has not checked his voicemail.  He will check and let me know.   Patient Self Care Activities:  . Patient verbalizes understanding of plan to refer to Care Guides for help with Transportation . Self administers medications as prescribed . Calls pharmacy for medication refills . Calls provider office for new concerns or questions . Unable to independently self navigate system for help with transportation  Initial goal documentation         The care management team will reach out to the patient again over the next 14 days.    Lazaro Arms RN, BSN, [facility] Care Management Coordinator [place] Phone: [phone] Fax: [phone]

## 2020-07-20 ENCOUNTER — Telehealth: Payer: Self-pay

## 2020-07-20 ENCOUNTER — Telehealth: Payer: Medicaid Other

## 2020-07-20 NOTE — Telephone Encounter (Signed)
  Care Management   Outreach Note  07/20/2020 Name: Sean Horton MRN: [id number] DOB: 1986/07/28  Referred by: [name], DO Reason for referral : Appointment (Transportation F/U)   An unsuccessful telephone outreach was attempted today. The patient was referred to the case management team for assistance with care management and care coordination.   Follow Up Plan: A HIPPA compliant phone message was left for the patient providing contact information and requesting a return call.  The care management team will reach out to the patient again over the next 7-14 days.   Lazaro Arms RN, BSN, [facility] Care Management Coordinator [place] Phone: [phone] Fax: [phone]

## 2020-07-21 ENCOUNTER — Telehealth: Payer: Self-pay | Admitting: *Deleted

## 2020-07-21 NOTE — Chronic Care Management (AMB) (Signed)
  Care Management   Note  07/21/2020 Name: [name] MRN: [id number] DOB: 10-09-1986  Sean Horton is a 34 y.o. year old male who is a primary care patient of [name], DO and is actively engaged with the care management team. I reached out to Dwana Curd by phone today to assist with re-scheduling a follow up visit with the RN Case Manager.  Follow up plan: Patient declines further follow up and engagement by the care management team. Appropriate care team members and provider have been notified via electronic communication.   [place], [place] [address] Direct Dial: [phone] [email or web address] Website: [email or web address]

## 2020-07-21 NOTE — Telephone Encounter (Signed)
Spoke with patient he does not want to reschedule call with you. Will route to PCP to make aware

## 2020-08-05 ENCOUNTER — Other Ambulatory Visit: Payer: Self-pay | Admitting: Family Medicine

## 2020-08-05 DIAGNOSIS — F419 Anxiety disorder, unspecified: Secondary | ICD-10-CM

## 2020-08-05 NOTE — Telephone Encounter (Signed)
Patient calls nurse line stating in order for him to get his wheelchair he needs additional information added to office visit. Patient said [employer] was going to be faxing over the additional information. Unfortunately, he needs another face to face visit. Patient has been scheduled for a virtual with PCP. I will also reach out to [employer] via epic incase we do not receive the fax.

## 2020-08-12 ENCOUNTER — Telehealth (INDEPENDENT_AMBULATORY_CARE_PROVIDER_SITE_OTHER): Payer: Medicaid Other | Admitting: Family Medicine

## 2020-08-12 ENCOUNTER — Telehealth: Payer: Self-pay | Admitting: Family Medicine

## 2020-08-12 ENCOUNTER — Encounter: Payer: Self-pay | Admitting: Family Medicine

## 2020-08-12 DIAGNOSIS — G8220 Paraplegia, unspecified: Secondary | ICD-10-CM | POA: Diagnosis not present

## 2020-08-12 DIAGNOSIS — F419 Anxiety disorder, unspecified: Secondary | ICD-10-CM

## 2020-08-12 DIAGNOSIS — R300 Dysuria: Secondary | ICD-10-CM | POA: Diagnosis not present

## 2020-08-12 DIAGNOSIS — F329 Major depressive disorder, single episode, unspecified: Secondary | ICD-10-CM | POA: Diagnosis not present

## 2020-08-12 MED ORDER — NITROFURANTOIN MONOHYD MACRO 100 MG PO CAPS: 100.0000 mg | ORAL_CAPSULE | Freq: Two times a day (BID) | ORAL | 0 refills | Status: AC

## 2020-08-12 MED ORDER — SERTRALINE HCL 25 MG PO TABS: 50.0000 mg | ORAL_TABLET | Freq: Every day | ORAL | 0 refills | Status: DC

## 2020-08-12 NOTE — Telephone Encounter (Signed)
Patient will taper off Cymbalta and start Zoloft in a few weeks. Curbsided Dr. Valentina Lucks, clinical pharmacist, who recommended taper Cymbalta from 60 mg BID to 60 mg for a week, 30 mg for a week and start Zoloft. Will titrate up low dose Zoloft (titrate up) and discontinue Cymbalta.  Discussed at length with patient and he verbalized understanding of plan.

## 2020-08-12 NOTE — Progress Notes (Addendum)
[place] Telemedicine Visit  Patient consented to have virtual visit. Method of visit: Video  Encounter participants: Patient: Sean Horton - located at home Provider: [name] - located at [facility] Others (if applicable):   Chief Complaint: mobility assessment  HPI:  Mobility  Patient would like to discuss getting his wheelchair. He has already been evaluated by PT. Patient reports wheelchair spokes and back of his wheelchair are broken. He has two different tires on the wheelchair. Feels like his feet are not secure as they keep slipping off. He has difficulty traveling with the wheelchair because the frame is not foldable and hard to get into a car.   Anxiety  Patient reports no change in his sx with Wellbutrin and states he feels as if Cymbalta is not helping as he has stopped this medication in the past and did not notice a change in his pain or his sx.   Dysuria Patient with hx of recurrent UTIs. Complains of dysuria. Tried increasing fluids and drinking cranberry juice but this is not helping.   ROS: per HPI  Pertinent PMHx: Paraplegia following spinal cord injury, reflex sympathetic dystrophy, syrinx of the spinal cord, neurogenic bladder, wheelchair-bound, recurrent osteomyelitis  Exam:  Respiratory: speaking in full sentences without pause   Assessment/Plan: Mobility assessment: Recent: Height 6'5"  Weight: 229 lbs  I have read and concur with the PT evaluation. Regarding Astronomer: Patient has struggled with skin breakdown and has had multiple sacral wounds that have led to recurrent osteomyelitis in the past. He would benefit from a skin protecting cushion. He suffers from autonomic dysreflexia which impairs his ability to perform daily activities like bathing, grooming, dressing in the home. A walker will not resolve issue with performing activities of daily living. A wheelchair will allow patient to safely  perform daily activities like cooking, cleaning and toileting. Patient can safely propel the wheelchair in the home or has a caregiver who can provide assistance. Length of need Lifetime.  Accessories: elevating leg rests (ELRs), wheel locks, extensions and anti-tippers.  Dysuria: Reviewed prior urine cultures with hx of S. aureus and E. Faecalis sensitive to [employer].  Treat with 5 days course of Macrobid BID for 5 days.   Anxiety:  Patient will taper off Cymbalta and start Zoloft in a few weeks. Curbsided Dr. Valentina Lucks, clinical pharmacist, who recommended taper Cymbalta from 60 mg BID to 60 mg for a week, 30 mg for a week and start Zoloft. Will titrate up low dose Zoloft (titrate up) and discontinue Cymbalta.    Time spent during visit with patient: 23 minutes

## 2020-09-11 ENCOUNTER — Other Ambulatory Visit: Payer: Self-pay | Admitting: Family Medicine

## 2020-09-11 DIAGNOSIS — F32A Depression, unspecified: Secondary | ICD-10-CM

## 2020-09-11 DIAGNOSIS — F419 Anxiety disorder, unspecified: Secondary | ICD-10-CM

## 2020-10-07 ENCOUNTER — Other Ambulatory Visit: Payer: Self-pay | Admitting: Family Medicine

## 2020-10-07 DIAGNOSIS — F32A Depression, unspecified: Secondary | ICD-10-CM

## 2020-12-21 ENCOUNTER — Other Ambulatory Visit: Payer: Self-pay | Admitting: Family Medicine

## 2020-12-21 DIAGNOSIS — F419 Anxiety disorder, unspecified: Secondary | ICD-10-CM

## 2020-12-21 DIAGNOSIS — F32A Depression, unspecified: Secondary | ICD-10-CM

## 2020-12-22 ENCOUNTER — Telehealth: Payer: Self-pay

## 2020-12-22 NOTE — Telephone Encounter (Signed)
Patient calls nurse line reporting UTI symptoms. Patient reports abnormal odor, and cloudy in color. Patient denies fevers or chills at this time. Patient reports he has to pay out of pocket for transportation and has covid concerns. Patient is hoping PCP will treat him without coming in. Will forward to PCP.

## 2020-12-24 NOTE — Telephone Encounter (Signed)
Discussed with patient that at this time I would not prescribe abx over the phone. Best practice would be to know what we were treating beforehand. Discussed following up with Dr. Susa Simmonds next week. He said ok and hung up the phone.   Whitney, DO 12/24/2020, 9:09 AM PGY-2, [place]

## 2021-01-10 ENCOUNTER — Telehealth: Payer: Self-pay

## 2021-01-10 NOTE — Telephone Encounter (Signed)
Patient calls nurse line requesting PCP to send in UTI treatment. Patient reports PCP usually does this for him without coming in, due to transportation issues. Patient reports cloudy urine and "feels off." Will forward to PCP to advise. Advised patient she is on nights currently.

## 2021-01-11 ENCOUNTER — Other Ambulatory Visit: Payer: Self-pay

## 2021-01-14 NOTE — Telephone Encounter (Signed)
Attempted to reach pt. To make follow up appt on his UTI. Pt has transportation issues, I was also going to try and set him up with [facility] transportation. I will contact Neoma Laming to see what else I need to do. Salvatore Marvel, CMA

## 2021-01-25 DIAGNOSIS — N3020 Other chronic cystitis without hematuria: Secondary | ICD-10-CM | POA: Diagnosis not present

## 2021-01-25 DIAGNOSIS — N319 Neuromuscular dysfunction of bladder, unspecified: Secondary | ICD-10-CM | POA: Diagnosis not present

## 2021-01-25 DIAGNOSIS — Q541 Hypospadias, penile: Secondary | ICD-10-CM | POA: Diagnosis not present

## 2021-02-24 DIAGNOSIS — Z6826 Body mass index (BMI) 26.0-26.9, adult: Secondary | ICD-10-CM | POA: Diagnosis not present

## 2021-02-24 DIAGNOSIS — Z978 Presence of other specified devices: Secondary | ICD-10-CM | POA: Diagnosis not present

## 2021-02-24 DIAGNOSIS — G890 Central pain syndrome: Secondary | ICD-10-CM | POA: Diagnosis not present

## 2021-02-24 DIAGNOSIS — G5791 Unspecified mononeuropathy of right lower limb: Secondary | ICD-10-CM | POA: Diagnosis not present

## 2021-02-24 DIAGNOSIS — G8220 Paraplegia, unspecified: Secondary | ICD-10-CM | POA: Diagnosis not present

## 2021-02-24 DIAGNOSIS — R252 Cramp and spasm: Secondary | ICD-10-CM | POA: Diagnosis not present

## 2021-02-24 DIAGNOSIS — G839 Paralytic syndrome, unspecified: Secondary | ICD-10-CM | POA: Diagnosis not present

## 2021-02-25 DIAGNOSIS — Q541 Hypospadias, penile: Secondary | ICD-10-CM | POA: Diagnosis not present

## 2021-02-25 DIAGNOSIS — N319 Neuromuscular dysfunction of bladder, unspecified: Secondary | ICD-10-CM | POA: Diagnosis not present

## 2021-02-25 DIAGNOSIS — N3020 Other chronic cystitis without hematuria: Secondary | ICD-10-CM | POA: Diagnosis not present

## 2021-03-28 DIAGNOSIS — N3020 Other chronic cystitis without hematuria: Secondary | ICD-10-CM | POA: Diagnosis not present

## 2021-03-28 DIAGNOSIS — N319 Neuromuscular dysfunction of bladder, unspecified: Secondary | ICD-10-CM | POA: Diagnosis not present

## 2021-03-28 DIAGNOSIS — Q541 Hypospadias, penile: Secondary | ICD-10-CM | POA: Diagnosis not present

## 2021-04-04 ENCOUNTER — Other Ambulatory Visit: Payer: Self-pay | Admitting: Family Medicine

## 2021-04-04 DIAGNOSIS — F32A Depression, unspecified: Secondary | ICD-10-CM

## 2021-04-29 DIAGNOSIS — Q541 Hypospadias, penile: Secondary | ICD-10-CM | POA: Diagnosis not present

## 2021-04-29 DIAGNOSIS — N3020 Other chronic cystitis without hematuria: Secondary | ICD-10-CM | POA: Diagnosis not present

## 2021-04-29 DIAGNOSIS — N319 Neuromuscular dysfunction of bladder, unspecified: Secondary | ICD-10-CM | POA: Diagnosis not present

## 2021-05-12 ENCOUNTER — Emergency Department (HOSPITAL_COMMUNITY): Payer: Medicare HMO

## 2021-05-12 ENCOUNTER — Encounter (HOSPITAL_COMMUNITY): Payer: Self-pay

## 2021-05-12 ENCOUNTER — Emergency Department (HOSPITAL_COMMUNITY)
Admission: EM | Admit: 2021-05-12 | Discharge: 2021-05-13 | Disposition: A | Discharge status: Home / Self Care | Payer: Medicare HMO | Attending: Emergency Medicine | Admitting: Emergency Medicine

## 2021-05-12 DIAGNOSIS — Z87891 Personal history of nicotine dependence: Secondary | ICD-10-CM | POA: Diagnosis not present

## 2021-05-12 DIAGNOSIS — E1165 Type 2 diabetes mellitus with hyperglycemia: Secondary | ICD-10-CM | POA: Insufficient documentation

## 2021-05-12 DIAGNOSIS — K760 Fatty (change of) liver, not elsewhere classified: Secondary | ICD-10-CM

## 2021-05-12 DIAGNOSIS — I10 Essential (primary) hypertension: Secondary | ICD-10-CM | POA: Diagnosis not present

## 2021-05-12 DIAGNOSIS — Z79899 Other long term (current) drug therapy: Secondary | ICD-10-CM | POA: Diagnosis not present

## 2021-05-12 DIAGNOSIS — K5900 Constipation, unspecified: Secondary | ICD-10-CM

## 2021-05-12 DIAGNOSIS — Z20822 Contact with and (suspected) exposure to covid-19: Secondary | ICD-10-CM | POA: Diagnosis not present

## 2021-05-12 DIAGNOSIS — N412 Abscess of prostate: Secondary | ICD-10-CM

## 2021-05-12 DIAGNOSIS — R1084 Generalized abdominal pain: Secondary | ICD-10-CM | POA: Diagnosis not present

## 2021-05-12 DIAGNOSIS — R110 Nausea: Secondary | ICD-10-CM | POA: Diagnosis not present

## 2021-05-12 DIAGNOSIS — Z7984 Long term (current) use of oral hypoglycemic drugs: Secondary | ICD-10-CM | POA: Insufficient documentation

## 2021-05-12 DIAGNOSIS — R739 Hyperglycemia, unspecified: Secondary | ICD-10-CM

## 2021-05-12 DIAGNOSIS — R109 Unspecified abdominal pain: Secondary | ICD-10-CM | POA: Diagnosis not present

## 2021-05-12 HISTORY — DX: Abscess of prostate: N41.2

## 2021-05-12 HISTORY — DX: Fatty (change of) liver, not elsewhere classified: K76.0

## 2021-05-12 LAB — URINALYSIS, ROUTINE W REFLEX MICROSCOPIC
Bilirubin Urine: NEGATIVE
Glucose, UA: >=500 mg/dL — AB
Ketones, ur: NEGATIVE mg/dL
Nitrite: POSITIVE — AB
Protein, ur: 30 mg/dL — AB
Specific Gravity, Urine: 1.015 (ref 1.005–1.030)
pH: 6 (ref 5.0–8.0)

## 2021-05-12 LAB — RESP PANEL BY RT-PCR (FLU A&B, COVID) ARPGX2
Influenza A by PCR: NEGATIVE
Influenza B by PCR: NEGATIVE
SARS Coronavirus 2 by RT PCR: NEGATIVE

## 2021-05-12 LAB — COMPREHENSIVE METABOLIC PANEL
ALT: 16 U/L (ref 0–44)
AST: 13 U/L — ABNORMAL LOW (ref 15–41)
Albumin: 3.6 g/dL (ref 3.5–5.0)
Alkaline Phosphatase: 107 U/L (ref 38–126)
Anion gap: 9 (ref 5–15)
BUN: 28 mg/dL — ABNORMAL HIGH (ref 6–20)
CO2: 28 mmol/L (ref 22–32)
Calcium: 10.1 mg/dL (ref 8.9–10.3)
Chloride: 95 mmol/L — ABNORMAL LOW (ref 98–111)
Creatinine, Ser: 1.15 mg/dL (ref 0.61–1.24)
GFR, Estimated: >60 mL/min (ref >60)
Glucose, Bld: 484 mg/dL — ABNORMAL HIGH (ref 70–99)
Potassium: 4.2 mmol/L (ref 3.5–5.1)
Sodium: 132 mmol/L — ABNORMAL LOW (ref 135–145)
Total Bilirubin: 1.2 mg/dL (ref 0.3–1.2)
Total Protein: 8.7 g/dL — ABNORMAL HIGH (ref 6.5–8.1)

## 2021-05-12 LAB — URINALYSIS, MICROSCOPIC (REFLEX)
Bacteria, UA: NONE SEEN
RBC / HPF: >50 RBC/hpf (ref 0–5)
Squamous Epithelial / HPF: NONE SEEN (ref 0–5)
WBC, UA: >50 WBC/hpf (ref 0–5)

## 2021-05-12 LAB — CBC
HCT: 40.3 % (ref 39.0–52.0)
Hemoglobin: 12.4 g/dL — ABNORMAL LOW (ref 13.0–17.0)
MCH: 24.7 pg — ABNORMAL LOW (ref 26.0–34.0)
MCHC: 30.8 g/dL (ref 30.0–36.0)
MCV: 80.3 fL (ref 80.0–100.0)
Platelets: 283 10*3/uL (ref 150–400)
RBC: 5.02 MIL/uL (ref 4.22–5.81)
RDW: 16.2 % — ABNORMAL HIGH (ref 11.5–15.5)
WBC: 9.1 10*3/uL (ref 4.0–10.5)
nRBC: 0 % (ref 0.0–0.2)

## 2021-05-12 LAB — LIPASE, BLOOD: Lipase: 30 U/L (ref 11–51)

## 2021-05-12 MED ORDER — OXYCODONE HCL 5 MG PO TABS: 5.0000 mg | ORAL_TABLET | Freq: Three times a day (TID) | ORAL | Status: DC | PRN

## 2021-05-12 MED ORDER — ONDANSETRON 4 MG PO TBDP: 4.0000 mg | ORAL_TABLET | Freq: Three times a day (TID) | ORAL | 0 refills | Status: DC | PRN

## 2021-05-12 MED ORDER — OXYCODONE ER 36 MG PO C12A: 36.0000 mg | EXTENDED_RELEASE_CAPSULE | Freq: Two times a day (BID) | ORAL | Status: DC

## 2021-05-12 MED ORDER — METFORMIN HCL 500 MG PO TABS: 500.0000 mg | ORAL_TABLET | Freq: Two times a day (BID) | ORAL | 0 refills | Status: DC

## 2021-05-12 MED ORDER — OXYCODONE HCL ER 10 MG PO T12A
40.0000 mg | EXTENDED_RELEASE_TABLET | Freq: Two times a day (BID) | ORAL | Status: DC
  Administered 2021-05-12: 40 mg via ORAL
  Filled 2021-05-12: qty 4

## 2021-05-12 MED ORDER — GOLYTELY 236 G PO SOLR: 4.0000 L | Freq: Once | ORAL | 0 refills | Status: AC

## 2021-05-12 MED ORDER — PREGABALIN 50 MG PO CAPS
300.0000 mg | ORAL_CAPSULE | Freq: Two times a day (BID) | ORAL | Status: DC
  Administered 2021-05-12: 300 mg via ORAL
  Filled 2021-05-12: qty 6

## 2021-05-12 MED ORDER — IBUPROFEN 200 MG PO TABS: 600.0000 mg | ORAL_TABLET | Freq: Three times a day (TID) | ORAL | Status: DC | PRN

## 2021-05-12 MED ORDER — IOHEXOL 300 MG/ML  SOLN
100.0000 mL | Freq: Once | INTRAMUSCULAR | Status: AC | PRN
  Administered 2021-05-12: 100 mL via INTRAVENOUS

## 2021-05-12 MED ORDER — ONDANSETRON HCL 4 MG/2ML IJ SOLN
4.0000 mg | Freq: Once | INTRAMUSCULAR | Status: AC
  Administered 2021-05-12: 4 mg via INTRAVENOUS
  Filled 2021-05-12: qty 2

## 2021-05-12 MED ORDER — SODIUM CHLORIDE (PF) 0.9 % IJ SOLN
INTRAMUSCULAR | Status: AC
  Filled 2021-05-12: qty 50

## 2021-05-12 MED ORDER — LACTATED RINGERS IV BOLUS
1000.0000 mL | Freq: Once | INTRAVENOUS | Status: AC
  Administered 2021-05-12: 1000 mL via INTRAVENOUS

## 2021-05-12 MED ORDER — HYDROMORPHONE HCL 1 MG/ML IJ SOLN
1.0000 mg | Freq: Once | INTRAMUSCULAR | Status: AC
  Administered 2021-05-12: 1 mg via INTRAVENOUS
  Filled 2021-05-12: qty 1

## 2021-05-12 MED ORDER — OXYCODONE-ACETAMINOPHEN 10-325 MG PO TABS: 1.0000 | ORAL_TABLET | Freq: Three times a day (TID) | ORAL | Status: DC | PRN

## 2021-05-12 MED ORDER — METFORMIN HCL 500 MG PO TABS
500.0000 mg | ORAL_TABLET | Freq: Once | ORAL | Status: AC
  Administered 2021-05-12: 500 mg via ORAL
  Filled 2021-05-12: qty 1

## 2021-05-12 MED ORDER — DOCUSATE SODIUM 100 MG PO CAPS: 100.0000 mg | ORAL_CAPSULE | Freq: Two times a day (BID) | ORAL | 0 refills | Status: DC

## 2021-05-12 MED ORDER — ONDANSETRON 4 MG PO TBDP
4.0000 mg | ORAL_TABLET | Freq: Once | ORAL | Status: AC | PRN
  Administered 2021-05-12: 4 mg via ORAL
  Filled 2021-05-12: qty 1

## 2021-05-12 MED ORDER — SODIUM CHLORIDE 0.9 % IV BOLUS
1000.0000 mL | Freq: Once | INTRAVENOUS | Status: AC
  Administered 2021-05-12: 1000 mL via INTRAVENOUS

## 2021-05-12 MED ORDER — OXYCODONE-ACETAMINOPHEN 5-325 MG PO TABS
1.0000 | ORAL_TABLET | Freq: Three times a day (TID) | ORAL | Status: DC | PRN
Start: 2021-05-12 — End: 2021-05-13

## 2021-05-12 MED ORDER — POLYETHYLENE GLYCOL 3350 17 G PO PACK: 17.0000 g | PACK | Freq: Every day | ORAL | 0 refills | Status: DC

## 2021-05-12 MED ORDER — LEVOFLOXACIN 500 MG PO TABS
500.0000 mg | ORAL_TABLET | Freq: Once | ORAL | Status: AC
  Administered 2021-05-12: 500 mg via ORAL
  Filled 2021-05-12: qty 1

## 2021-05-12 MED ORDER — KETOROLAC TROMETHAMINE 15 MG/ML IJ SOLN
15.0000 mg | Freq: Once | INTRAMUSCULAR | Status: AC
  Administered 2021-05-12: 15 mg via INTRAVENOUS
  Filled 2021-05-12: qty 1

## 2021-05-12 MED ORDER — LEVOFLOXACIN 500 MG PO TABS: 500.0000 mg | ORAL_TABLET | Freq: Every day | ORAL | 0 refills | Status: AC

## 2021-05-12 NOTE — ED Notes (Signed)
Patient transported to CT 

## 2021-05-12 NOTE — ED Notes (Signed)
Updated Carlisle Beers, pt's mother, via phone regarding pt status

## 2021-05-12 NOTE — Discharge Instructions (Addendum)
You have prostatitis or inflammation/infection of the prostate with a small amount of abscess.  It is very important to take the antibiotics as prescribed, starting tomorrow.  If at any point you get new or worse symptoms or fever, vomiting, etc. then return to the ER or call 911.  Follow-up with urology in 7-10 days.  Your blood work shows very elevated glucose consistent with diabetes.  You are being started on medicine for this and need to follow-up closely with your primary care physician.

## 2021-05-12 NOTE — ED Notes (Signed)
1800 mL of soap suds administered to pt rectally in left lateral lying position with Bonnita Nasuti, NT assisting. Pt instructed to hold enema fluid in rectum as much as possible. Call bell within reach for BM assistance as needed

## 2021-05-12 NOTE — ED Notes (Signed)
Called [facility] for transport.  

## 2021-05-12 NOTE — ED Triage Notes (Addendum)
Patient arrived via [facility] from home.   C/O generalized abdominal pain since last week and intermittent nausea.  Denies emesis.   No issues with urination or BM. Patient still able to eat and take medications.  Patient is paraplegic from spinal injury since 2008.   A/Ox4  VS- Bp-112/70 p-90 rr-18 97% RA 97.3 temp
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
## 2021-05-12 NOTE — ED Notes (Signed)
Provided pt with chicken noodle soup and offered him a diet drink but he refused the drink.

## 2021-05-13 DIAGNOSIS — R531 Weakness: Secondary | ICD-10-CM | POA: Diagnosis not present

## 2021-05-13 DIAGNOSIS — Z743 Need for continuous supervision: Secondary | ICD-10-CM | POA: Diagnosis not present

## 2021-05-13 DIAGNOSIS — R5381 Other malaise: Secondary | ICD-10-CM | POA: Diagnosis not present

## 2021-05-13 DIAGNOSIS — K5900 Constipation, unspecified: Secondary | ICD-10-CM | POA: Diagnosis not present

## 2021-05-14 LAB — URINE CULTURE

## 2021-05-19 DIAGNOSIS — G8220 Paraplegia, unspecified: Secondary | ICD-10-CM | POA: Diagnosis not present

## 2021-05-19 DIAGNOSIS — G5791 Unspecified mononeuropathy of right lower limb: Secondary | ICD-10-CM | POA: Diagnosis not present

## 2021-05-19 DIAGNOSIS — G890 Central pain syndrome: Secondary | ICD-10-CM | POA: Diagnosis not present

## 2021-05-19 DIAGNOSIS — Z978 Presence of other specified devices: Secondary | ICD-10-CM | POA: Diagnosis not present

## 2021-05-19 DIAGNOSIS — G839 Paralytic syndrome, unspecified: Secondary | ICD-10-CM | POA: Diagnosis not present

## 2021-05-19 DIAGNOSIS — R252 Cramp and spasm: Secondary | ICD-10-CM | POA: Diagnosis not present

## 2021-05-19 DIAGNOSIS — R030 Elevated blood-pressure reading, without diagnosis of hypertension: Secondary | ICD-10-CM | POA: Diagnosis not present

## 2021-06-12 DIAGNOSIS — Z743 Need for continuous supervision: Secondary | ICD-10-CM | POA: Diagnosis not present

## 2021-06-12 DIAGNOSIS — R4020 Unspecified coma: Secondary | ICD-10-CM | POA: Diagnosis not present

## 2021-06-12 DIAGNOSIS — R404 Transient alteration of awareness: Secondary | ICD-10-CM | POA: Diagnosis not present

## 2021-06-20 ENCOUNTER — Encounter: Payer: Self-pay | Admitting: Family Medicine

## 2021-06-20 DIAGNOSIS — I871 Compression of vein: Secondary | ICD-10-CM | POA: Insufficient documentation

## 2021-06-20 DIAGNOSIS — Z95828 Presence of other vascular implants and grafts: Secondary | ICD-10-CM

## 2021-06-20 HISTORY — DX: Presence of other vascular implants and grafts: Z95.828
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
## 2021-06-23 NOTE — Progress Notes (Unsigned)
I spoke with the medical examiner, Sean Horton, yesterday at about 2 pm. He thought that Sean Horton's death may be a medical examiner's case. I sounded as though he wished to speak with the funeral home that has Sean Horton's body before making a formal decision.   I sent message this morning to Sean Horton that I believe Sean Horton's body is at [facility] in [place].   I await formal decision by Sean Horton as to whether this is an ME case or not.  I have not completed Sean Horton's death certification as I await this decision.

## 2021-06-23 NOTE — Progress Notes (Unsigned)
I have relinquished the case to the medical examiner, Randalyn Rhea.

## 2021-06-27 DIAGNOSIS — 419620001 Death: Secondary | SNOMED CT | POA: Diagnosis not present

## 2021-06-27 DEATH — deceased
